# Patient Record
Sex: Female | Born: 1980
Health system: Southern US, Community
[De-identification: ages and names within clinical notes are randomized; demographics above are authoritative.]

## PROBLEM LIST (undated history)

## (undated) DIAGNOSIS — K5792 Diverticulitis of intestine, part unspecified, without perforation or abscess without bleeding: Secondary | ICD-10-CM

## (undated) DIAGNOSIS — Q248 Other specified congenital malformations of heart: Secondary | ICD-10-CM

## (undated) DIAGNOSIS — K219 Gastro-esophageal reflux disease without esophagitis: Secondary | ICD-10-CM

## (undated) DIAGNOSIS — R112 Nausea with vomiting, unspecified: Secondary | ICD-10-CM

## (undated) DIAGNOSIS — I471 Supraventricular tachycardia, unspecified: Secondary | ICD-10-CM

## (undated) DIAGNOSIS — I959 Hypotension, unspecified: Secondary | ICD-10-CM

## (undated) DIAGNOSIS — M1990 Unspecified osteoarthritis, unspecified site: Secondary | ICD-10-CM

## (undated) DIAGNOSIS — T4145XA Adverse effect of unspecified anesthetic, initial encounter: Secondary | ICD-10-CM

## (undated) DIAGNOSIS — Z9889 Other specified postprocedural states: Secondary | ICD-10-CM

## (undated) DIAGNOSIS — T8859XA Other complications of anesthesia, initial encounter: Secondary | ICD-10-CM

## (undated) DIAGNOSIS — M792 Neuralgia and neuritis, unspecified: Secondary | ICD-10-CM

## (undated) DIAGNOSIS — R519 Headache, unspecified: Secondary | ICD-10-CM

## (undated) DIAGNOSIS — R51 Headache: Secondary | ICD-10-CM

## (undated) DIAGNOSIS — E860 Dehydration: Secondary | ICD-10-CM

## (undated) DIAGNOSIS — F419 Anxiety disorder, unspecified: Secondary | ICD-10-CM

## (undated) HISTORY — PX: JOINT REPLACEMENT: SHX530

## (undated) HISTORY — PX: TONSILLECTOMY: SUR1361

## (undated) HISTORY — PX: ABDOMINAL HYSTERECTOMY: SHX81

## (undated) HISTORY — PX: CHOLECYSTECTOMY: SHX55

## (undated) HISTORY — PX: KNEE ARTHROSCOPY: SUR90

## (undated) HISTORY — DX: Hypotension, unspecified: I95.9

## (undated) HISTORY — DX: Diverticulitis of intestine, part unspecified, without perforation or abscess without bleeding: K57.92

## (undated) HISTORY — DX: Supraventricular tachycardia: I47.1

## (undated) HISTORY — DX: Dehydration: E86.0

## (undated) HISTORY — PX: OTHER SURGICAL HISTORY: SHX169

---
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
## 2003-11-25 HISTORY — PX: CHOLECYSTECTOMY: SHX55

## 2006-11-05 ENCOUNTER — Inpatient Hospital Stay: Payer: Self-pay

## 2007-09-03 ENCOUNTER — Emergency Department: Payer: Self-pay | Admitting: Emergency Medicine

## 2007-09-03 ENCOUNTER — Other Ambulatory Visit: Payer: Self-pay

## 2008-07-10 ENCOUNTER — Emergency Department: Payer: Self-pay | Admitting: Emergency Medicine

## 2009-02-14 ENCOUNTER — Emergency Department: Payer: Self-pay | Admitting: Emergency Medicine

## 2009-02-21 ENCOUNTER — Ambulatory Visit: Payer: Self-pay | Admitting: Internal Medicine

## 2009-02-22 ENCOUNTER — Ambulatory Visit: Payer: Self-pay | Admitting: Internal Medicine

## 2010-02-13 ENCOUNTER — Ambulatory Visit: Payer: Self-pay

## 2010-07-04 ENCOUNTER — Ambulatory Visit: Payer: Self-pay | Admitting: Orthopedic Surgery

## 2010-07-04 HISTORY — PX: KNEE ARTHROSCOPY: SUR90

## 2010-07-19 ENCOUNTER — Inpatient Hospital Stay: Payer: Self-pay | Admitting: Internal Medicine

## 2011-01-01 ENCOUNTER — Ambulatory Visit: Payer: Self-pay | Admitting: Internal Medicine

## 2011-04-30 ENCOUNTER — Ambulatory Visit: Payer: Self-pay

## 2011-10-05 ENCOUNTER — Emergency Department: Payer: Self-pay | Admitting: Emergency Medicine

## 2012-05-20 ENCOUNTER — Ambulatory Visit: Payer: Self-pay | Admitting: Family Medicine

## 2012-05-20 LAB — PREGNANCY, URINE: Pregnancy Test, Urine: NEGATIVE m[IU]/mL

## 2012-07-10 ENCOUNTER — Emergency Department: Payer: Self-pay | Admitting: *Deleted

## 2012-07-10 LAB — URINALYSIS, COMPLETE
Bacteria: NONE SEEN
Bilirubin,UR: NEGATIVE
Glucose,UR: NEGATIVE mg/dL (ref 0–75)
Ketone: NEGATIVE
Leukocyte Esterase: NEGATIVE
Protein: NEGATIVE
Specific Gravity: 1.025 (ref 1.003–1.030)
Squamous Epithelial: 1
WBC UR: 1 /HPF (ref 0–5)

## 2012-07-10 LAB — COMPREHENSIVE METABOLIC PANEL
Albumin: 4 g/dL (ref 3.4–5.0)
Anion Gap: 7 (ref 7–16)
Bilirubin,Total: 0.3 mg/dL (ref 0.2–1.0)
Calcium, Total: 8.8 mg/dL (ref 8.5–10.1)
Chloride: 103 mmol/L (ref 98–107)
Creatinine: 0.75 mg/dL (ref 0.60–1.30)
EGFR (African American): >60
EGFR (Non-African Amer.): >60
Glucose: 108 mg/dL — ABNORMAL HIGH (ref 65–99)
Osmolality: 279 (ref 275–301)
Potassium: 3.7 mmol/L (ref 3.5–5.1)
Sodium: 140 mmol/L (ref 136–145)
Total Protein: 7.6 g/dL (ref 6.4–8.2)

## 2012-07-10 LAB — CBC
MCH: 30.1 pg (ref 26.0–34.0)
Platelet: 301 10*3/uL (ref 150–440)
RDW: 12.3 % (ref 11.5–14.5)
WBC: 11.6 10*3/uL — ABNORMAL HIGH (ref 3.6–11.0)

## 2012-07-10 LAB — PREGNANCY, URINE: Pregnancy Test, Urine: NEGATIVE m[IU]/mL

## 2013-06-01 DIAGNOSIS — Q248 Other specified congenital malformations of heart: Secondary | ICD-10-CM | POA: Insufficient documentation

## 2013-11-10 DIAGNOSIS — E860 Dehydration: Secondary | ICD-10-CM

## 2013-11-10 DIAGNOSIS — F32A Depression, unspecified: Secondary | ICD-10-CM | POA: Insufficient documentation

## 2013-11-10 DIAGNOSIS — F329 Major depressive disorder, single episode, unspecified: Secondary | ICD-10-CM | POA: Insufficient documentation

## 2013-11-10 DIAGNOSIS — R197 Diarrhea, unspecified: Secondary | ICD-10-CM | POA: Insufficient documentation

## 2013-11-10 DIAGNOSIS — I959 Hypotension, unspecified: Secondary | ICD-10-CM

## 2013-11-10 HISTORY — DX: Dehydration: E86.0

## 2013-11-10 HISTORY — DX: Hypotension, unspecified: I95.9

## 2014-05-23 ENCOUNTER — Ambulatory Visit: Payer: Self-pay | Admitting: Gynecologic Oncology

## 2014-05-24 ENCOUNTER — Ambulatory Visit: Payer: Self-pay | Admitting: Gynecologic Oncology

## 2014-05-24 LAB — CA 125: CA 125: 8.9 U/mL (ref 0.0–34.0)

## 2014-06-28 ENCOUNTER — Ambulatory Visit: Payer: Self-pay | Admitting: Obstetrics and Gynecology

## 2014-06-28 LAB — PROTIME-INR
INR: 1
PROTHROMBIN TIME: 12.9 s (ref 11.5–14.7)

## 2014-06-28 LAB — CBC
HCT: 41.3 % (ref 35.0–47.0)
HGB: 13.9 g/dL (ref 12.0–16.0)
MCH: 30.2 pg (ref 26.0–34.0)
MCHC: 33.6 g/dL (ref 32.0–36.0)
MCV: 90 fL (ref 80–100)
Platelet: 288 10*3/uL (ref 150–440)
RBC: 4.6 10*6/uL (ref 3.80–5.20)
RDW: 12.6 % (ref 11.5–14.5)
WBC: 9.4 10*3/uL (ref 3.6–11.0)

## 2014-06-28 LAB — COMPREHENSIVE METABOLIC PANEL
ALT: 32 U/L
AST: 28 U/L (ref 15–37)
Albumin: 3.9 g/dL (ref 3.4–5.0)
Alkaline Phosphatase: 49 U/L
Anion Gap: 6 — ABNORMAL LOW (ref 7–16)
BILIRUBIN TOTAL: 0.4 mg/dL (ref 0.2–1.0)
BUN: 9 mg/dL (ref 7–18)
CHLORIDE: 105 mmol/L (ref 98–107)
Calcium, Total: 8.7 mg/dL (ref 8.5–10.1)
Co2: 27 mmol/L (ref 21–32)
Creatinine: 0.7 mg/dL (ref 0.60–1.30)
EGFR (Non-African Amer.): >60
Glucose: 86 mg/dL (ref 65–99)
OSMOLALITY: 274 (ref 275–301)
POTASSIUM: 4 mmol/L (ref 3.5–5.1)
SODIUM: 138 mmol/L (ref 136–145)
Total Protein: 7.6 g/dL (ref 6.4–8.2)

## 2014-06-28 LAB — APTT: Activated PTT: 31 secs (ref 23.6–35.9)

## 2014-07-04 ENCOUNTER — Ambulatory Visit: Payer: Self-pay | Admitting: Obstetrics and Gynecology

## 2014-07-04 HISTORY — PX: ABDOMINAL HYSTERECTOMY: SHX81

## 2014-07-04 HISTORY — PX: LAPAROSCOPIC TOTAL HYSTERECTOMY: SUR800

## 2014-07-05 LAB — BASIC METABOLIC PANEL
Anion Gap: 6 — ABNORMAL LOW (ref 7–16)
BUN: 6 mg/dL — ABNORMAL LOW (ref 7–18)
CHLORIDE: 103 mmol/L (ref 98–107)
Calcium, Total: 8.6 mg/dL (ref 8.5–10.1)
Co2: 28 mmol/L (ref 21–32)
Creatinine: 0.93 mg/dL (ref 0.60–1.30)
EGFR (Non-African Amer.): >60
GLUCOSE: 168 mg/dL — AB (ref 65–99)
Osmolality: 275 (ref 275–301)
Potassium: 4 mmol/L (ref 3.5–5.1)
Sodium: 137 mmol/L (ref 136–145)

## 2014-07-05 LAB — CBC
HCT: 37.5 % (ref 35.0–47.0)
HCT: 37.8 % (ref 35.0–47.0)
HGB: 12.7 g/dL (ref 12.0–16.0)
HGB: 12.4 g/dL (ref 12.0–16.0)
MCH: 30.2 pg (ref 26.0–34.0)
MCH: 29.6 pg (ref 26.0–34.0)
MCHC: 33.8 g/dL (ref 32.0–36.0)
MCHC: 32.9 g/dL (ref 32.0–36.0)
MCV: 90 fL (ref 80–100)
MCV: 90 fL (ref 80–100)
PLATELETS: 266 10*3/uL (ref 150–440)
Platelet: 261 10*3/uL (ref 150–440)
RBC: 4.19 10*6/uL (ref 3.80–5.20)
RBC: 4.2 10*6/uL (ref 3.80–5.20)
RDW: 12.4 % (ref 11.5–14.5)
RDW: 12.7 % (ref 11.5–14.5)
WBC: 19.9 10*3/uL — ABNORMAL HIGH (ref 3.6–11.0)
WBC: 17.1 10*3/uL — ABNORMAL HIGH (ref 3.6–11.0)

## 2014-07-05 LAB — MAGNESIUM: MAGNESIUM: 1.7 mg/dL — AB

## 2014-07-06 LAB — CBC
HCT: 34.7 % — ABNORMAL LOW
HGB: 11.6 g/dL — ABNORMAL LOW
MCH: 30.2 pg
MCHC: 33.5 g/dL
MCV: 90 fL
Platelet: 233 x10 3/mm 3
RBC: 3.84 X10 6/mm 3
RDW: 12.8 %
WBC: 12.2 x10 3/mm 3 — ABNORMAL HIGH

## 2014-07-06 LAB — PATHOLOGY REPORT

## 2015-01-06 ENCOUNTER — Emergency Department: Payer: Self-pay | Admitting: Emergency Medicine

## 2015-03-13 ENCOUNTER — Emergency Department
Admit: 2015-03-13 | Disposition: A | Discharge status: Home / Self Care | Payer: Self-pay | Admitting: Emergency Medicine

## 2015-03-17 NOTE — Op Note (Signed)
PATIENT NAME:  Brenda Sellers, Brenda Sellers MR#:  [id number] DATE OF BIRTH:  04/22/1981  DATE OF PROCEDURE:  07/04/2014  PREOPERATIVE DIAGNOSES:  1.  Chronic pelvic pain.  2.  Family history of breast and ovarian cancer.   POSTOPERATIVE DIAGNOSES: 1.  Chronic pelvic pain.  2.  Family history of breast and ovarian cancer.   PROCEDURES:  1.  Total laparoscopic hysterectomy.  2.  Bilateral salpingo-oophorectomy.  3.  Cystoscopy.   ANESTHESIA: General.  SURGEON: Conard Novak, MD   ASSISTANT SURGEON: Florina Ou. Bonney Aid, MD   ESTIMATED BLOOD LOSS: 100 mL.  OPERATIVE FLUIDS: Crystalloid 1200 mL.   URINE OUTPUT: Clear urine 200 mL.   COMPLICATIONS: None.   FINDINGS:  1.  Normal-appearing gravid uterus, fallopian tubes, and ovaries (small paratubal cyst on right).  2.  No obvious defects of the lateral wall and bilateral ureteral efflux of urine on cystoscopy.   SPECIMENS:  1.  Uterus and cervix.  2.  Right and left fallopian tubes and ovaries.   CONDITION AT END OF PROCEDURE: Stable.   PROCEDURE IN DETAIL: The patient was taken to the Operating Room where general anesthesia was administered and found to be adequate. The patient was placed in the dorsal supine lithotomy position in [place] stirrups and prepped and draped in the usual sterile fashion. The patient was positioned in such a fashion to minimize any risk of damage to vulnerable nerves. After a timeout was called, sterile speculum was placed in the bladder and a single-tooth tenaculum was affixed to the anterior lip of the cervix. The uterus was sounded to a depth of 8 cm. The V-Care device was then affixed the cervix according to the manufacturer's recommendations after removal of the single-tooth tenaculum. The V-Care balloon was inflated. A vaginal occluder balloon was placed over the V-Care device at this point.   An indwelling catheter was then placed in her bladder.   Attention was turned to the abdomen where after  injection of local anesthetic a 5 mm infraumbilical incision was made with the scalpel. Entrance was gained into the abdomen using an Optiview trocar technique with direct camera visualization upon entrance. Entrance to the abdomen was verified using opening pressures. The abdomen was then insufflated with CO2. The camera was reintroduced through the trocar and atraumatic entry was verified. A left lower quadrant 5 mm port was placed under direct intra-abdominal camera visualization without difficulty. A right lower quadrant 11 mm trocar was placed similarly under direct intra-abdominal camera visualization. A survey of the pelvis was taken with the above-noted findings. The ureters were then visualized and found to be well away from operative area of interest. The right infundibulopelvic ligament was identified and was ligated using bipolar electrocautery and transected using the Harmonic scalpel. This was found to be hemostatic and the broad ligament was then dissected to the level of the round ligament, which was similarly cauterized and transected. The anterior leaf of the broad ligament was then transected using the Harmonic scalpel to the level of the internal os. A bladder flap was created. The posterior leaf of the broad ligament was then dissected using the Harmonic scalpel, and the right uterine artery was skeletonized, and ligated using bipolar electrocautery, and transected with the Harmonic scalpel. The same procedure was carried out on the left side after visualization of the ureter. Once the bladder was well away from the [facility] ring, the colpotomy was made using the Harmonic in a circumferential fashion. The Koh ring was followed until the  uterus and cervix were completely liberated. The uterus was then removed through the vagina. The vaginal cleft was closed using #0 V-Loc suture in a running fashion. A gloved hand was placed in the vagina to test the integrity of the closure.   All pedicles were  inspected and found to be hemostatic. The abdomen was then desufflated of CO2.   Cystoscopy was undertaken using a 70 degree cystoscope after removal of the indwelling catheter. The above-noted findings were appreciated, and the cystoscope was then removed and the indwelling catheter was replaced. The vagina was inspected and found to be clear of all instrumentation, sponges, and tissue.   All of the trocars were removed and each port site was closed subcutaneously using #3-0 Vicryl. The skin was reapproximated in the right lower quadrant port using a #4-0 Vicryl undyed. All skin closures were reinforced using Dermabond and another total of 10 mL of local anesthetic was injected for a total of 20 mL throughout the procedure.   The patient tolerated the procedure well. Sponge, lap, and needle counts were correct x 2. For VTE prophylaxis, the patient was wearing pneumatic compression stockings throughout the entire procedure. For antibiotic prophylaxis, the patient received clindamycin 900 mg and gentamicin 120 mg IV prior to the start of the surgery. The patient was awakened in the Operating Room and taken to the recovery area in stable condition.    ____________________________ Conard NovakStephen D. Jackson, MD sdj:TT D: 07/04/2014 13:45:24 ET T: 07/04/2014 16:35:03 ET JOB#: [id number]  cc: Conard NovakStephen D. Jackson, MD, <Dictator> Conard NovakSTEPHEN D JACKSON MD ELECTRONICALLY SIGNED 08/09/2014 18:16

## 2015-03-24 ENCOUNTER — Emergency Department
Admit: 2015-03-24 | Disposition: A | Discharge status: Home / Self Care | Payer: Self-pay | Admitting: Emergency Medicine

## 2015-03-28 ENCOUNTER — Other Ambulatory Visit: Payer: Self-pay | Admitting: Orthopedic Surgery

## 2015-03-28 DIAGNOSIS — S52122A Displaced fracture of head of left radius, initial encounter for closed fracture: Secondary | ICD-10-CM

## 2015-03-29 ENCOUNTER — Ambulatory Visit
Admission: RE | Admit: 2015-03-29 | Discharge: 2015-03-29 | Disposition: A | Discharge status: Home / Self Care | Payer: 59 | Source: Ambulatory Visit | Attending: Orthopedic Surgery | Admitting: Orthopedic Surgery

## 2015-03-29 DIAGNOSIS — S52122A Displaced fracture of head of left radius, initial encounter for closed fracture: Secondary | ICD-10-CM

## 2015-04-03 ENCOUNTER — Encounter (HOSPITAL_COMMUNITY): Payer: Self-pay | Admitting: *Deleted

## 2015-04-03 MED ORDER — CEFAZOLIN SODIUM-DEXTROSE 2-3 GM-% IV SOLR
2.0000 g | INTRAVENOUS | Status: AC
  Administered 2015-04-04: 2 g via INTRAVENOUS
  Filled 2015-04-03: qty 50

## 2015-04-04 ENCOUNTER — Ambulatory Visit (HOSPITAL_COMMUNITY)
Admission: RE | Admit: 2015-04-04 | Discharge: 2015-04-05 | Disposition: A | Discharge status: Home / Self Care | Payer: 59 | Source: Ambulatory Visit | Attending: Orthopedic Surgery | Admitting: Orthopedic Surgery

## 2015-04-04 ENCOUNTER — Encounter (HOSPITAL_COMMUNITY): Payer: Self-pay | Admitting: *Deleted

## 2015-04-04 ENCOUNTER — Inpatient Hospital Stay (HOSPITAL_COMMUNITY): Payer: 59 | Admitting: Anesthesiology

## 2015-04-04 ENCOUNTER — Encounter (HOSPITAL_COMMUNITY)
Admission: RE | Disposition: A | Discharge status: Home / Self Care | Payer: Self-pay | Source: Ambulatory Visit | Attending: Orthopedic Surgery

## 2015-04-04 DIAGNOSIS — F419 Anxiety disorder, unspecified: Secondary | ICD-10-CM | POA: Insufficient documentation

## 2015-04-04 DIAGNOSIS — Z90710 Acquired absence of both cervix and uterus: Secondary | ICD-10-CM | POA: Insufficient documentation

## 2015-04-04 DIAGNOSIS — Z881 Allergy status to other antibiotic agents status: Secondary | ICD-10-CM | POA: Diagnosis not present

## 2015-04-04 DIAGNOSIS — Z886 Allergy status to analgesic agent status: Secondary | ICD-10-CM | POA: Insufficient documentation

## 2015-04-04 DIAGNOSIS — S52122A Displaced fracture of head of left radius, initial encounter for closed fracture: Principal | ICD-10-CM | POA: Insufficient documentation

## 2015-04-04 DIAGNOSIS — Y9351 Activity, roller skating (inline) and skateboarding: Secondary | ICD-10-CM | POA: Diagnosis not present

## 2015-04-04 DIAGNOSIS — Z9049 Acquired absence of other specified parts of digestive tract: Secondary | ICD-10-CM | POA: Diagnosis not present

## 2015-04-04 DIAGNOSIS — W1830XA Fall on same level, unspecified, initial encounter: Secondary | ICD-10-CM | POA: Insufficient documentation

## 2015-04-04 HISTORY — DX: Other specified postprocedural states: Z98.890

## 2015-04-04 HISTORY — DX: Anxiety disorder, unspecified: F41.9

## 2015-04-04 HISTORY — DX: Other complications of anesthesia, initial encounter: T88.59XA

## 2015-04-04 HISTORY — DX: Adverse effect of unspecified anesthetic, initial encounter: T41.45XA

## 2015-04-04 HISTORY — PX: ORIF ELBOW FRACTURE: SHX5031

## 2015-04-04 HISTORY — DX: Other specified postprocedural states: R11.2

## 2015-04-04 LAB — CBC
HCT: 41.5 % (ref 36.0–46.0)
Hemoglobin: 13.7 g/dL (ref 12.0–15.0)
MCH: 28.8 pg (ref 26.0–34.0)
MCHC: 33 g/dL (ref 30.0–36.0)
MCV: 87.4 fL (ref 78.0–100.0)
Platelets: 255 10*3/uL (ref 150–400)
RBC: 4.75 MIL/uL (ref 3.87–5.11)
RDW: 12.2 % (ref 11.5–15.5)
WBC: 7.8 10*3/uL (ref 4.0–10.5)

## 2015-04-04 LAB — BASIC METABOLIC PANEL
Anion gap: 13 (ref 5–15)
BUN: 8 mg/dL (ref 6–20)
CALCIUM: 9.3 mg/dL (ref 8.9–10.3)
CO2: 21 mmol/L — ABNORMAL LOW (ref 22–32)
Chloride: 102 mmol/L (ref 101–111)
Creatinine, Ser: 0.81 mg/dL (ref 0.44–1.00)
Glucose, Bld: 91 mg/dL (ref 70–99)
Potassium: 3.8 mmol/L (ref 3.5–5.1)
Sodium: 136 mmol/L (ref 135–145)

## 2015-04-04 SURGERY — OPEN REDUCTION INTERNAL FIXATION (ORIF) ELBOW/OLECRANON FRACTURE
Anesthesia: General | Site: Elbow | Laterality: Left

## 2015-04-04 MED ORDER — HYDROMORPHONE HCL 1 MG/ML IJ SOLN
INTRAMUSCULAR | Status: AC
  Filled 2015-04-04: qty 1

## 2015-04-04 MED ORDER — DIAZEPAM 5 MG PO TABS
5.0000 mg | ORAL_TABLET | Freq: Every day | ORAL | Status: DC
  Administered 2015-04-04: 5 mg via ORAL
  Filled 2015-04-04: qty 1

## 2015-04-04 MED ORDER — MIDAZOLAM HCL 5 MG/5ML IJ SOLN
INTRAMUSCULAR | Status: DC | PRN
  Administered 2015-04-04 (×2): 1 mg via INTRAVENOUS

## 2015-04-04 MED ORDER — DEXAMETHASONE SODIUM PHOSPHATE 10 MG/ML IJ SOLN
INTRAMUSCULAR | Status: DC | PRN
  Administered 2015-04-04: 10 mg via INTRAVENOUS

## 2015-04-04 MED ORDER — PROPOFOL 10 MG/ML IV BOLUS
INTRAVENOUS | Status: AC
  Filled 2015-04-04: qty 20

## 2015-04-04 MED ORDER — OXYCODONE-ACETAMINOPHEN 5-325 MG PO TABS
1.0000 | ORAL_TABLET | ORAL | Status: DC | PRN
  Administered 2015-04-04 – 2015-04-05 (×4): 2 via ORAL
  Filled 2015-04-04 (×4): qty 2

## 2015-04-04 MED ORDER — MORPHINE SULFATE 2 MG/ML IJ SOLN
1.0000 mg | INTRAMUSCULAR | Status: DC | PRN
  Administered 2015-04-05 (×3): 1 mg via INTRAVENOUS
  Filled 2015-04-04 (×3): qty 1

## 2015-04-04 MED ORDER — OXYCODONE HCL 5 MG PO TABS
5.0000 mg | ORAL_TABLET | Freq: Four times a day (QID) | ORAL | Status: DC | PRN
  Administered 2015-04-05: 5 mg via ORAL
  Filled 2015-04-04: qty 1

## 2015-04-04 MED ORDER — OXYCODONE-ACETAMINOPHEN 5-325 MG PO TABS: 1.0000 | ORAL_TABLET | Freq: Four times a day (QID) | ORAL | Status: DC | PRN

## 2015-04-04 MED ORDER — ROPIVACAINE HCL 5 MG/ML IJ SOLN
INTRAMUSCULAR | Status: DC | PRN
  Administered 2015-04-04: 20 mL via PERINEURAL

## 2015-04-04 MED ORDER — ONDANSETRON HCL 4 MG/2ML IJ SOLN
4.0000 mg | Freq: Four times a day (QID) | INTRAMUSCULAR | Status: DC | PRN
  Administered 2015-04-05 (×2): 4 mg via INTRAVENOUS
  Filled 2015-04-04 (×3): qty 2

## 2015-04-04 MED ORDER — ONDANSETRON HCL 4 MG/2ML IJ SOLN
INTRAMUSCULAR | Status: DC | PRN
  Administered 2015-04-04: 4 mg via INTRAVENOUS

## 2015-04-04 MED ORDER — VITAMIN C 500 MG PO TABS
1000.0000 mg | ORAL_TABLET | Freq: Every day | ORAL | Status: DC
  Administered 2015-04-04: 1000 mg via ORAL
  Filled 2015-04-04: qty 2

## 2015-04-04 MED ORDER — KCL IN DEXTROSE-NACL 20-5-0.45 MEQ/L-%-% IV SOLN
INTRAVENOUS | Status: AC
  Filled 2015-04-04: qty 1000

## 2015-04-04 MED ORDER — ONDANSETRON HCL 4 MG PO TABS
4.0000 mg | ORAL_TABLET | Freq: Four times a day (QID) | ORAL | Status: DC | PRN
Start: 2015-04-04 — End: 2015-04-05

## 2015-04-04 MED ORDER — MIDAZOLAM HCL 2 MG/2ML IJ SOLN
INTRAMUSCULAR | Status: AC
  Filled 2015-04-04: qty 2

## 2015-04-04 MED ORDER — CLINDAMYCIN PHOSPHATE 600 MG/50ML IV SOLN
600.0000 mg | Freq: Three times a day (TID) | INTRAVENOUS | Status: DC
Start: 2015-04-04 — End: 2015-04-05
  Administered 2015-04-04 – 2015-04-05 (×3): 600 mg via INTRAVENOUS
  Filled 2015-04-04 (×5): qty 50

## 2015-04-04 MED ORDER — HYDROMORPHONE HCL 1 MG/ML IJ SOLN
0.2500 mg | INTRAMUSCULAR | Status: DC | PRN
  Administered 2015-04-04 (×4): 0.5 mg via INTRAVENOUS

## 2015-04-04 MED ORDER — SERTRALINE HCL 100 MG PO TABS
100.0000 mg | ORAL_TABLET | Freq: Every day | ORAL | Status: DC
  Administered 2015-04-05: 100 mg via ORAL
  Filled 2015-04-04: qty 1

## 2015-04-04 MED ORDER — ALPRAZOLAM 0.5 MG PO TABS: 0.5000 mg | ORAL_TABLET | Freq: Four times a day (QID) | ORAL | Status: DC | PRN

## 2015-04-04 MED ORDER — LIDOCAINE HCL (CARDIAC) 20 MG/ML IV SOLN
INTRAVENOUS | Status: DC | PRN
  Administered 2015-04-04: 60 mg via INTRAVENOUS

## 2015-04-04 MED ORDER — FENTANYL CITRATE (PF) 100 MCG/2ML IJ SOLN
INTRAMUSCULAR | Status: DC | PRN
  Administered 2015-04-04 (×5): 50 ug via INTRAVENOUS

## 2015-04-04 MED ORDER — PROMETHAZINE HCL 25 MG/ML IJ SOLN
INTRAMUSCULAR | Status: AC
Start: 2015-04-04 — End: 2015-04-05
  Filled 2015-04-04: qty 1

## 2015-04-04 MED ORDER — METHOCARBAMOL 500 MG PO TABS
500.0000 mg | ORAL_TABLET | Freq: Four times a day (QID) | ORAL | Status: DC
Start: 2015-04-04 — End: 2016-01-02

## 2015-04-04 MED ORDER — DOCUSATE SODIUM 100 MG PO CAPS
100.0000 mg | ORAL_CAPSULE | Freq: Two times a day (BID) | ORAL | Status: DC
  Administered 2015-04-05: 100 mg via ORAL
  Filled 2015-04-04 (×2): qty 1

## 2015-04-04 MED ORDER — KCL IN DEXTROSE-NACL 20-5-0.45 MEQ/L-%-% IV SOLN
INTRAVENOUS | Status: DC
  Administered 2015-04-04 – 2015-04-05 (×2): via INTRAVENOUS
  Filled 2015-04-04 (×3): qty 1000

## 2015-04-04 MED ORDER — FENTANYL CITRATE (PF) 250 MCG/5ML IJ SOLN
INTRAMUSCULAR | Status: AC
  Filled 2015-04-04: qty 5

## 2015-04-04 MED ORDER — METHOCARBAMOL 500 MG PO TABS
500.0000 mg | ORAL_TABLET | Freq: Four times a day (QID) | ORAL | Status: DC | PRN
  Administered 2015-04-04 – 2015-04-05 (×4): 500 mg via ORAL
  Filled 2015-04-04 (×4): qty 1

## 2015-04-04 MED ORDER — ONDANSETRON HCL 4 MG PO TABS: 4.0000 mg | ORAL_TABLET | Freq: Three times a day (TID) | ORAL | Status: DC | PRN

## 2015-04-04 MED ORDER — DEXTROSE 5 % IV SOLN
500.0000 mg | Freq: Four times a day (QID) | INTRAVENOUS | Status: DC | PRN
  Filled 2015-04-04: qty 5

## 2015-04-04 MED ORDER — ONDANSETRON HCL 4 MG/2ML IJ SOLN
INTRAMUSCULAR | Status: AC
  Filled 2015-04-04: qty 2

## 2015-04-04 MED ORDER — DOCUSATE SODIUM 100 MG PO CAPS: 100.0000 mg | ORAL_CAPSULE | Freq: Two times a day (BID) | ORAL | Status: DC

## 2015-04-04 MED ORDER — SCOPOLAMINE 1 MG/3DAYS TD PT72: 1.0000 | MEDICATED_PATCH | TRANSDERMAL | Status: DC

## 2015-04-04 MED ORDER — HYDROCODONE-ACETAMINOPHEN 10-325 MG PO TABS: 1.0000 | ORAL_TABLET | ORAL | Status: DC | PRN

## 2015-04-04 MED ORDER — PROMETHAZINE HCL 25 MG/ML IJ SOLN
6.2500 mg | INTRAMUSCULAR | Status: DC | PRN
  Administered 2015-04-04: 6.25 mg via INTRAVENOUS
  Administered 2015-04-04: 12.5 mg via INTRAVENOUS

## 2015-04-04 MED ORDER — 0.9 % SODIUM CHLORIDE (POUR BTL) OPTIME
TOPICAL | Status: DC | PRN
  Administered 2015-04-04: 1000 mL

## 2015-04-04 MED ORDER — LIDOCAINE HCL (CARDIAC) 20 MG/ML IV SOLN
INTRAVENOUS | Status: AC
  Filled 2015-04-04: qty 5

## 2015-04-04 MED ORDER — PROPOFOL 10 MG/ML IV BOLUS
INTRAVENOUS | Status: DC | PRN
  Administered 2015-04-04: 50 mg via INTRAVENOUS
  Administered 2015-04-04: 200 mg via INTRAVENOUS

## 2015-04-04 MED ORDER — VITAMIN C 500 MG PO TABS: 500.0000 mg | ORAL_TABLET | Freq: Every day | ORAL | Status: DC

## 2015-04-04 MED ORDER — OXYCODONE-ACETAMINOPHEN 10-325 MG PO TABS: 1.0000 | ORAL_TABLET | ORAL | Status: DC | PRN

## 2015-04-04 MED ORDER — CHLORHEXIDINE GLUCONATE 4 % EX LIQD
60.0000 mL | Freq: Once | CUTANEOUS | Status: DC
  Filled 2015-04-04: qty 60

## 2015-04-04 MED ORDER — DIPHENHYDRAMINE HCL 25 MG PO CAPS
25.0000 mg | ORAL_CAPSULE | Freq: Four times a day (QID) | ORAL | Status: DC | PRN
Start: 2015-04-04 — End: 2015-04-05

## 2015-04-04 MED ORDER — FENTANYL CITRATE (PF) 100 MCG/2ML IJ SOLN
INTRAMUSCULAR | Status: AC
  Filled 2015-04-04: qty 2

## 2015-04-04 MED ORDER — DEXAMETHASONE SODIUM PHOSPHATE 10 MG/ML IJ SOLN
INTRAMUSCULAR | Status: AC
  Filled 2015-04-04: qty 1

## 2015-04-04 MED ORDER — ADULT MULTIVITAMIN W/MINERALS CH: 1.0000 | ORAL_TABLET | Freq: Every day | ORAL | Status: DC

## 2015-04-04 MED ORDER — LIDOCAINE-EPINEPHRINE (PF) 1.5 %-1:200000 IJ SOLN
INTRAMUSCULAR | Status: DC | PRN
  Administered 2015-04-04: 10 mL via PERINEURAL

## 2015-04-04 MED ORDER — LACTATED RINGERS IV SOLN
INTRAVENOUS | Status: DC
  Administered 2015-04-04 (×2): via INTRAVENOUS

## 2015-04-04 MED ORDER — ALPRAZOLAM 0.25 MG PO TABS: 0.2500 mg | ORAL_TABLET | ORAL | Status: DC | PRN

## 2015-04-04 MED ORDER — ZOLPIDEM TARTRATE 5 MG PO TABS: 5.0000 mg | ORAL_TABLET | Freq: Every evening | ORAL | Status: DC | PRN

## 2015-04-04 MED ORDER — OXYCODONE-ACETAMINOPHEN 10-325 MG PO TABS: 1.0000 | ORAL_TABLET | Freq: Four times a day (QID) | ORAL | Status: DC | PRN

## 2015-04-04 MED ORDER — SCOPOLAMINE 1 MG/3DAYS TD PT72
MEDICATED_PATCH | TRANSDERMAL | Status: AC
  Administered 2015-04-04: 1.5 mg
  Filled 2015-04-04: qty 1

## 2015-04-04 SURGICAL SUPPLY — 61 items
BANDAGE ELASTIC 3 VELCRO ST LF (GAUZE/BANDAGES/DRESSINGS) ×2 IMPLANT
BANDAGE ELASTIC 4 VELCRO ST LF (GAUZE/BANDAGES/DRESSINGS) ×2 IMPLANT
BENZOIN TINCTURE PRP APPL 2/3 (GAUZE/BANDAGES/DRESSINGS) ×2 IMPLANT
BIT DRILL MINI LNG ACUTRAK 2 (BIT) ×1 IMPLANT
BNDG COHESIVE 4X5 TAN STRL (GAUZE/BANDAGES/DRESSINGS) ×2 IMPLANT
BNDG ESMARK 4X9 LF (GAUZE/BANDAGES/DRESSINGS) ×2 IMPLANT
BNDG GAUZE ELAST 4 BULKY (GAUZE/BANDAGES/DRESSINGS) ×2 IMPLANT
CORDS BIPOLAR (ELECTRODE) ×2 IMPLANT
COVER MAYO STAND STRL (DRAPES) IMPLANT
COVER SURGICAL LIGHT HANDLE (MISCELLANEOUS) ×2 IMPLANT
CUFF TOURNIQUET SINGLE 18IN (TOURNIQUET CUFF) IMPLANT
CUFF TOURNIQUET SINGLE 24IN (TOURNIQUET CUFF) IMPLANT
DRAPE INCISE IOBAN 66X45 STRL (DRAPES) ×2 IMPLANT
DRAPE OEC MINIVIEW 54X84 (DRAPES) ×2 IMPLANT
DRAPE ORTHO SPLIT 77X108 STRL (DRAPES) ×1
DRAPE SURG ORHT 6 SPLT 77X108 (DRAPES) ×1 IMPLANT
DRAPE U-SHAPE 47X51 STRL (DRAPES) ×2 IMPLANT
DRILL MINI LNG ACUTRAK 2 (BIT) ×2
DRSG ADAPTIC 3X8 NADH LF (GAUZE/BANDAGES/DRESSINGS) ×2 IMPLANT
GAUZE SPONGE 4X4 12PLY STRL (GAUZE/BANDAGES/DRESSINGS) ×2 IMPLANT
GLOVE BIOGEL PI IND STRL 8.5 (GLOVE) ×2 IMPLANT
GLOVE BIOGEL PI INDICATOR 8.5 (GLOVE) ×2
GLOVE SURG ORTHO 8.0 STRL STRW (GLOVE) ×4 IMPLANT
GOWN STRL REUS W/ TWL LRG LVL3 (GOWN DISPOSABLE) ×2 IMPLANT
GOWN STRL REUS W/ TWL XL LVL3 (GOWN DISPOSABLE) ×1 IMPLANT
GOWN STRL REUS W/TWL LRG LVL3 (GOWN DISPOSABLE) ×2
GOWN STRL REUS W/TWL XL LVL3 (GOWN DISPOSABLE) ×1
GUIDEWIRE ORTHO MINI ACTK .045 (WIRE) ×6 IMPLANT
KIT BASIN OR (CUSTOM PROCEDURE TRAY) ×4 IMPLANT
KIT ROOM TURNOVER OR (KITS) ×2 IMPLANT
LONG MINI SCREW ×2 IMPLANT
LOOP VESSEL MAXI BLUE (MISCELLANEOUS) IMPLANT
MANIFOLD NEPTUNE II (INSTRUMENTS) IMPLANT
NEEDLE HYPO 25GX1X1/2 BEV (NEEDLE) IMPLANT
NS IRRIG 1000ML POUR BTL (IV SOLUTION) ×2 IMPLANT
PACK ORTHO EXTREMITY (CUSTOM PROCEDURE TRAY) ×2 IMPLANT
PAD ARMBOARD 7.5X6 YLW CONV (MISCELLANEOUS) ×2 IMPLANT
PAD CAST 3X4 CTTN HI CHSV (CAST SUPPLIES) ×3 IMPLANT
PAD CAST 4YDX4 CTTN HI CHSV (CAST SUPPLIES) IMPLANT
PADDING CAST COTTON 3X4 STRL (CAST SUPPLIES) ×3
PADDING CAST COTTON 4X4 STRL (CAST SUPPLIES)
SCREW ACUTRAK 2 MINI 18MM (Screw) ×2 IMPLANT
SCREW ACUTRAK 2 MINI 20MM (Screw) ×2 IMPLANT
SLING ARM FOAM STRAP LRG (SOFTGOODS) ×2 IMPLANT
SOAP 2 % CHG 4 OZ (WOUND CARE) ×2 IMPLANT
SPLINT FIBERGLASS 3X35 (CAST SUPPLIES) ×2 IMPLANT
STRIP CLOSURE SKIN 1/2X4 (GAUZE/BANDAGES/DRESSINGS) ×2 IMPLANT
SUCTION FRAZIER TIP 10 FR DISP (SUCTIONS) IMPLANT
SUT MERSILENE 4 0 P 3 (SUTURE) IMPLANT
SUT PROLENE 4 0 PS 2 18 (SUTURE) ×2 IMPLANT
SUT VIC AB 0 CT1 27 (SUTURE) ×1
SUT VIC AB 0 CT1 27XBRD ANBCTR (SUTURE) ×1 IMPLANT
SUT VIC AB 2-0 CT1 27 (SUTURE) ×1
SUT VIC AB 2-0 CT1 TAPERPNT 27 (SUTURE) ×1 IMPLANT
SUT VICRYL 4-0 PS2 18IN ABS (SUTURE) ×2 IMPLANT
SYR CONTROL 10ML LL (SYRINGE) ×2 IMPLANT
TOWEL OR 17X24 6PK STRL BLUE (TOWEL DISPOSABLE) ×2 IMPLANT
TOWEL OR 17X26 10 PK STRL BLUE (TOWEL DISPOSABLE) ×2 IMPLANT
TUBE CONNECTING 12X1/4 (SUCTIONS) ×2 IMPLANT
UNDERPAD 30X30 INCONTINENT (UNDERPADS AND DIAPERS) IMPLANT
WATER STERILE IRR 1000ML POUR (IV SOLUTION) IMPLANT

## 2015-04-04 NOTE — Anesthesia Postprocedure Evaluation (Signed)
  Anesthesia Post-op Note  Patient: Brenda Sellers  Procedure(s) Performed: Procedure(s): OPEN REDUCTION INTERNAL FIXATION (ORIF) LEFT ELBOW/OLECRANON FRACTURE (Left)  Patient Location: PACU  Anesthesia Type: General   Level of Consciousness: awake, alert  and oriented  Airway and Oxygen Therapy: Patient Spontanous Breathing  Post-op Pain: moderate  Post-op Assessment: Post-op Vital signs reviewed  Post-op Vital Signs: Reviewed  Last Vitals:  Filed Vitals:   04/04/15 1715  BP: 122/109  Pulse:   Temp:   Resp:     Complications: No apparent anesthesia complications

## 2015-04-04 NOTE — Anesthesia Preprocedure Evaluation (Signed)
Anesthesia Evaluation  Patient identified by MRN, date of birth, ID band Patient awake    Reviewed: Allergy & Precautions, NPO status , Patient's Chart, lab work & pertinent test results  History of Anesthesia Complications (+) PONV  Airway Mallampati: II  TM Distance: >3 FB Neck ROM: Full    Dental no notable dental hx.    Pulmonary neg pulmonary ROS, former smoker,  breath sounds clear to auscultation  Pulmonary exam normal       Cardiovascular negative cardio ROS Normal cardiovascular examRhythm:Regular Rate:Normal     Neuro/Psych negative neurological ROS  negative psych ROS   GI/Hepatic negative GI ROS, Neg liver ROS,   Endo/Other  obesity  Renal/GU negative Renal ROS  negative genitourinary   Musculoskeletal negative musculoskeletal ROS (+)   Abdominal   Peds negative pediatric ROS (+)  Hematology negative hematology ROS (+)   Anesthesia Other Findings   Reproductive/Obstetrics negative OB ROS                             Anesthesia Physical Anesthesia Plan  ASA: II  Anesthesia Plan: General   Post-op Pain Management:    Induction: Intravenous  Airway Management Planned: Oral ETT and LMA  Additional Equipment:   Intra-op Plan:   Post-operative Plan: Extubation in OR  Informed Consent: I have reviewed the patients History and Physical, chart, labs and discussed the procedure including the risks, benefits and alternatives for the proposed anesthesia with the patient or authorized representative who has indicated his/her understanding and acceptance.   Dental advisory given  Plan Discussed with: CRNA and Surgeon  Anesthesia Plan Comments:         Anesthesia Quick Evaluation

## 2015-04-04 NOTE — Transfer of Care (Signed)
Immediate Anesthesia Transfer of Care Note  Patient: Brenda Sellers  Procedure(s) Performed: Procedure(s): OPEN REDUCTION INTERNAL FIXATION (ORIF) LEFT ELBOW/OLECRANON FRACTURE (Left)  Patient Location: PACU  Anesthesia Type:GA combined with regional for post-op pain  Level of Consciousness: sedated  Airway & Oxygen Therapy: Patient Spontanous Breathing and Patient connected to nasal cannula oxygen  Post-op Assessment: Report given to RN and Post -op Vital signs reviewed and stable  Post vital signs: stable  Last Vitals:  Filed Vitals:   04/04/15 1458  BP: 110/80  Pulse: 91  Temp: 36.9 C  Resp: 20    Complications: No apparent anesthesia complications

## 2015-04-04 NOTE — H&P (Signed)
Brenda Sellers is an 34 y.o. female.   Chief Complaint: LEFT ELBOW RADIAL HEAD FRACTURE HPI: PT WITH FALL ON LEFT WRIST WITH INJURY TO LEFT ELBOW NO PRIOR INJURY TO LEFT ELBOW PT SEEN/EVALUATED IN OFFICE PT HERE FOR SURGERY TO LEFT ELBOW  Past Medical History  Diagnosis Date  . Complication of anesthesia   . PONV (postoperative nausea and vomiting)     Last surgery 06/2014- nausea not vomitting was pre medicated.  . Anxiety     Past Surgical History  Procedure Laterality Date  . Laparoscopic total hysterectomy  07/04/14  . Tonsillectomy    . Knee arthroscopy Right     Menicus  . Abdominal hysterectomy    . Cholecystectomy    . Anterior cruciate ligament repair Left 1998    Menicus, PCL Screw    History reviewed. No pertinent family history. Social History:  reports that she has quit smoking. She does not have any smokeless tobacco history on file. She reports that she does not drink alcohol or use illicit drugs.  Allergies:  Allergies  Allergen Reactions  . Augmentin [Amoxicillin-Pot Clavulanate] Rash    Can take any other PCNs  . Vicodin [Hydrocodone-Acetaminophen] Nausea And Vomiting and Rash    Medications Prior to Admission  Medication Sig Dispense Refill  . acetaminophen (TYLENOL) 500 MG tablet Take 1,000 mg by mouth every 6 (six) hours as needed. None since started on Percocet    . diazepam (VALIUM) 5 MG tablet Take 5 mg by mouth at bedtime.    . fluconazole (DIFLUCAN) 100 MG tablet Take 100 mg by mouth daily.    Marland Kitchen. oxyCODONE-acetaminophen (PERCOCET) 10-325 MG per tablet Take 1 tablet by mouth every 6 (six) hours as needed for pain.    Marland Kitchen. sertraline (ZOLOFT) 100 MG tablet Take 100 mg by mouth daily.    Marland Kitchen. ALPRAZolam (XANAX) 0.25 MG tablet Take 0.25 mg by mouth every 4 (four) hours as needed for anxiety.      No results found for this or any previous visit (from the past 48 hour(s)). No results found.  ROSNO RECENT ILLNESSESS OR HOSPITALIZATIONS  Height 5\' 9"   (1.753 m), weight 107.956 kg (238 lb). Physical Exam  General Appearance:  Alert, cooperative, no distress, appears stated age  Head:  Normocephalic, without obvious abnormality, atraumatic  Eyes:  Pupils equal, conjunctiva/corneas clear,         Throat: Lips, mucosa, and tongue normal; teeth and gums normal  Neck: No visible masses     Lungs:   respirations unlabored  Chest Wall:  No tenderness or deformity  Heart:  Regular rate and rhythm,  Abdomen:   Soft, non-tender,         Extremities: LUE: LONG ARM SPLINT IN PLACE FINGERS WARM WELL PERFUSED ABLE TO EXTEND THUMB AND DIGITS ABLE TO CROSS FINGERS  Pulses: 2+ and symmetric  Skin: Skin color, texture, turgor normal, no rashes or lesions     Neurologic: Normal    Assessment/Plan LEFT ELBOW DISPLACED RADIAL HEAD FRACTURE  LEFT ELBOW OPEN REDUCTION AND INTERNAL FIXATION AND REPAIR AS INDICATED  R/B/A DISCUSSED WITH PT IN OFFICE.  PT VOICED UNDERSTANDING OF PLAN CONSENT SIGNED DAY OF SURGERY PT SEEN AND EXAMINED PRIOR TO OPERATIVE PROCEDURE/DAY OF SURGERY SITE MARKED. QUESTIONS ANSWERED WILL BE ADMITTED FOLLOWING SURGERY OBSERVATION  WE ARE PLANNING SURGERY FOR YOUR UPPER EXTREMITY. THE RISKS AND BENEFITS OF SURGERY INCLUDE BUT NOT LIMITED TO BLEEDING INFECTION, DAMAGE TO NEARBY NERVES ARTERIES TENDONS, FAILURE OF SURGERY TO  ACCOMPLISH ITS INTENDED GOALS, PERSISTENT SYMPTOMS AND NEED FOR FURTHER SURGICAL INTERVENTION. WITH THIS IN MIND WE WILL PROCEED. I HAVE DISCUSSED WITH THE PATIENT THE PRE AND POSTOPERATIVE REGIMEN AND THE DOS AND DON'TS. PT VOICED UNDERSTANDING AND INFORMED CONSENT SIGNED.  Sharma CovertORTMANN,Jaydalee Bardwell W 04/04/2015, 2:52 PM

## 2015-04-04 NOTE — Brief Op Note (Signed)
04/04/2015  2:55 PM  PATIENT:  Brenda Sellers  34 y.o. female  PRE-OPERATIVE DIAGNOSIS:  left elbow proximal radius fracture  POST-OPERATIVE DIAGNOSIS:  SAME  PROCEDURE:  Procedure(s): OPEN REDUCTION INTERNAL FIXATION (ORIF) LEFT ELBOW/OLECRANON FRACTURE (Left)  SURGEON:  Surgeon(s) and Role:    * Bradly BienenstockFred Lanayah Gartley, MD - Primary  PHYSICIAN ASSISTANT:   ASSISTANTS: CHABON PA-C  ANESTHESIA:   general  EBL:     BLOOD ADMINISTERED:none  DRAINS: none   LOCAL MEDICATIONS USED:  NONE  SPECIMEN:  No Specimen  DISPOSITION OF SPECIMEN:  N/A  COUNTS:  YES  TOURNIQUET:    DICTATION: .Other Dictation: Dictation Number [id number]  PLAN OF CARE: Admit for overnight observation  PATIENT DISPOSITION:  PACU - hemodynamically stable.   Delay start of Pharmacological VTE agent (>24hrs) due to surgical blood loss or risk of bleeding: not applicable

## 2015-04-04 NOTE — Anesthesia Procedure Notes (Signed)
Anesthesia Regional Block:  Supraclavicular block  Pre-Anesthetic Checklist: ,, timeout performed, Correct Patient, Correct Site, Correct Laterality, Correct Procedure, Correct Position, site marked, Risks and benefits discussed,  Surgical consent,  Pre-op evaluation,  At surgeon's request and post-op pain management  Laterality: Left  Prep: chloraprep       Needles:  Injection technique: Single-shot  Needle Type: Echogenic Stimulator Needle     Needle Length: 9cm 9 cm Needle Gauge: 21 and 21 G    Additional Needles:  Procedures: ultrasound guided (picture in chart) Supraclavicular block Narrative:  Injection made incrementally with aspirations every 5 mL.  Performed by: Personally  Anesthesiologist: Bethaney Oshana  Additional Notes: Patient tolerated the procedure well without complications

## 2015-04-05 DIAGNOSIS — S52122A Displaced fracture of head of left radius, initial encounter for closed fracture: Secondary | ICD-10-CM | POA: Diagnosis not present

## 2015-04-05 LAB — GLUCOSE, CAPILLARY: Glucose-Capillary: 177 mg/dL — ABNORMAL HIGH (ref 65–99)

## 2015-04-05 MED ORDER — HYDROMORPHONE HCL 2 MG PO TABS
2.0000 mg | ORAL_TABLET | ORAL | Status: DC | PRN
  Administered 2015-04-05: 2 mg via ORAL
  Filled 2015-04-05: qty 1

## 2015-04-05 MED ORDER — HYDROMORPHONE HCL 2 MG PO TABS: 2.0000 mg | ORAL_TABLET | ORAL | Status: DC | PRN

## 2015-04-05 NOTE — Discharge Instructions (Signed)
KEEP BANDAGE CLEAN AND DRY CALL OFFICE FOR F/U APPT [phone] in 12 days [facility] PHONE [phone] KEEP HAND ELEVATED ABOVE HEART OK TO APPLY ICE TO OPERATIVE AREA CONTACT OFFICE IF ANY WORSENING PAIN OR CONCERNS.

## 2015-04-05 NOTE — Discharge Summary (Signed)
Physician Discharge Summary  Patient ID: Brenda Sellers MRN: [id number] DOB/AGE: [birth year] 34 y.o.  Admit date: 04/04/2015 Discharge date: 04/05/2015  Admission Diagnoses: left elbow proximal radius fracture Past Medical History  Diagnosis Date  . Complication of anesthesia   . PONV (postoperative nausea and vomiting)     Last surgery 06/2014- nausea not vomitting was pre medicated.  . Anxiety     Discharge Diagnoses:  Active Problems:   Left radial head fracture   Surgeries: Procedure(s): OPEN REDUCTION INTERNAL FIXATION (ORIF) LEFT ELBOW/OLECRANON FRACTURE on 04/04/2015    Consultants:  NONE  Discharged Condition: Improved  Hospital Course: Brenda Sellers is an 34 y.o. female who was admitted 04/04/2015 with a chief complaint of No chief complaint on file. , and found to have a diagnosis of left elbow proximal radius fracture.  They were brought to the operating room on 04/04/2015 and underwent Procedure(s): OPEN REDUCTION INTERNAL FIXATION (ORIF) LEFT ELBOW/OLECRANON FRACTURE.    They were given perioperative antibiotics: Anti-infectives    Start     Dose/Rate Route Frequency Ordered Stop   04/04/15 2100  clindamycin (CLEOCIN) IVPB 600 mg     600 mg 100 mL/hr over 30 Minutes Intravenous 3 times per day 04/04/15 2006     04/04/15 0800  ceFAZolin (ANCEF) IVPB 2 g/50 mL premix     2 g 100 mL/hr over 30 Minutes Intravenous To Surgery 04/03/15 1325 04/04/15 1546    .  They were given sequential compression devices, early ambulation, and Other (comment)AMBULATION for DVT prophylaxis.  Recent vital signs: Patient Vitals for the past 24 hrs:  BP Temp Temp src Pulse Resp SpO2 Height Weight  04/05/15 0547 (!) 106/52 mmHg 98.3 F (36.8 C) - 76 16 98 % - -  04/05/15 0143 (!) 115/56 mmHg 97.9 F (36.6 C) - 93 16 99 % - -  04/04/15 2003 (!) 103/51 mmHg 98.6 F (37 C) Oral (!) 108 12 100 % - -  04/04/15 1915 - 98.8 F (37.1 C) - (!) 101 (!) 23 98 % - -  04/04/15 1900 114/70  mmHg - - 100 12 97 % - -  04/04/15 1845 116/66 mmHg - - (!) 110 11 98 % - -  04/04/15 1830 133/83 mmHg - - (!) 119 17 94 % - -  04/04/15 1815 129/77 mmHg - - (!) 103 13 97 % - -  04/04/15 1800 133/78 mmHg - - (!) 104 12 97 % - -  04/04/15 1745 123/72 mmHg - - (!) 107 11 97 % - -  04/04/15 1730 126/73 mmHg - - (!) 110 15 97 % - -  04/04/15 1715 (!) 122/109 mmHg - - (!) 116 13 98 % - -  04/04/15 1700 (!) 150/84 mmHg - - (!) 113 18 96 % - -  04/04/15 1515 - - - 89 15 100 % - -  04/04/15 1500 - - - 92 18 100 % - -  04/04/15 1458 110/80 mmHg 98.4 F (36.9 C) Oral 91 20 100 % - -  04/04/15 1447 - - - - - -  (1.753 m) 107.956 kg (238 lb)  .  Recent laboratory studies: No results found.  Discharge Medications:     Medication List    TAKE these medications        acetaminophen 500 MG tablet  Commonly known as:  TYLENOL  Take 1,000 mg by mouth every 6 (six) hours as needed. None since started on Percocet  ALPRAZolam 0.25 MG tablet  Commonly known as:  XANAX  Take 0.25 mg by mouth every 4 (four) hours as needed for anxiety.     diazepam 5 MG tablet  Commonly known as:  VALIUM  Take 5 mg by mouth at bedtime.     docusate sodium 100 MG capsule  Commonly known as:  COLACE  Take 1 capsule (100 mg total) by mouth 2 (two) times daily.     fluconazole 100 MG tablet  Commonly known as:  DIFLUCAN  Take 100 mg by mouth daily.     methocarbamol 500 MG tablet  Commonly known as:  ROBAXIN  Take 1 tablet (500 mg total) by mouth 4 (four) times daily.     ondansetron 4 MG tablet  Commonly known as:  ZOFRAN  Take 1 tablet (4 mg total) by mouth every 8 (eight) hours as needed for nausea or vomiting.     oxyCODONE-acetaminophen 10-325 MG per tablet  Commonly known as:  PERCOCET  Take 1 tablet by mouth every 6 (six) hours as needed for pain.     oxyCODONE-acetaminophen 10-325 MG per tablet  Commonly known as:  PERCOCET  Take 1 tablet by mouth every 4 (four) hours as needed for  pain.     sertraline 100 MG tablet  Commonly known as:  ZOLOFT  Take 100 mg by mouth daily.     vitamin C 500 MG tablet  Commonly known as:  ASCORBIC ACID  Take 1 tablet (500 mg total) by mouth daily.        Diagnostic Studies: Dg Elbow Complete Left  03/24/2015   CLINICAL DATA:  Injury while roller-skating. Fell onto left elbow, with left elbow pain. Initial encounter.  EXAM: LEFT ELBOW - COMPLETE 3+ VIEW  COMPARISON:  None.  FINDINGS: There is a displaced, mildly impacted and comminuted fracture involving the volar aspect of the radial head. An underlying joint effusion is noted. The soft tissues are otherwise grossly unremarkable. No additional fractures are seen.  IMPRESSION: Displaced, mildly impacted and comminuted fracture involving the volar aspect of the radial head. Underlying joint effusion noted.   Electronically Signed   By: Roanna RaiderJeffery  Chang M.D.   On: 03/24/2015 23:35   Ct Elbow Left W/o Cm  03/29/2015   CLINICAL DATA:  Evaluate radial head fracture. Patient fell roller-skating 03/24/2015.  Nonspecific (abnormal) findings on radiological and other examination of musculoskeletal sysem.  EXAM: CT OF THE LEFT ELBOW WITHOUT CONTRAST; 3-DIMENSIONAL CT IMAGE RENDERING ON INDEPENDENT WORKSTATION  TECHNIQUE: Multidetector CT imaging was performed according to the standard protocol. Multiplanar CT image reconstructions were also generated.  COMPARISON:  None.  FINDINGS: There is a mildly depressed radial head fracture involving the articular surface. The fracture courses across the middle of the articular surface. There is minimal depression estimated at between 1.5 and 2 mm. No radial neck fracture. The capitellum is intact.  Mild spurring change along the medial aspect of the proximal ulna. There are also small rounded densities near the coronoid process which could be old avulsion fractures or small unfused secondary ossification centers.  Moderate-sized joint effusion.  No osteochondral  lesion.  IMPRESSION: Minimally depressed intra-articular radial head fracture as discussed above. Please see 3D images.  No other fractures are identified.  Moderate-sized joint effusion.   Electronically Signed   By: Rudie MeyerP.  Gallerani M.D.   On: 03/29/2015 10:59   Ct 3d Independent Annabell SabalWkst  03/29/2015   CLINICAL DATA:  Evaluate radial head fracture. Patient fell roller-skating 03/24/2015.  Nonspecific (abnormal) findings on radiological and other examination of musculoskeletal sysem.  EXAM: CT OF THE LEFT ELBOW WITHOUT CONTRAST; 3-DIMENSIONAL CT IMAGE RENDERING ON INDEPENDENT WORKSTATION  TECHNIQUE: Multidetector CT imaging was performed according to the standard protocol. Multiplanar CT image reconstructions were also generated.  COMPARISON:  None.  FINDINGS: There is a mildly depressed radial head fracture involving the articular surface. The fracture courses across the middle of the articular surface. There is minimal depression estimated at between 1.5 and 2 mm. No radial neck fracture. The capitellum is intact.  Mild spurring change along the medial aspect of the proximal ulna. There are also small rounded densities near the coronoid process which could be old avulsion fractures or small unfused secondary ossification centers.  Moderate-sized joint effusion.  No osteochondral lesion.  IMPRESSION: Minimally depressed intra-articular radial head fracture as discussed above. Please see 3D images.  No other fractures are identified.  Moderate-sized joint effusion.   Electronically Signed   By: Rudie MeyerP.  Gallerani M.D.   On: 03/29/2015 10:59    They benefited maximally from their hospital stay and there were no complications.     Disposition:      Discharge Instructions    Discharge patient    Complete by:  As directed              Signed: Sharma CovertTMANN,Delicia Berens W 04/05/2015, 7:56 AM

## 2015-04-05 NOTE — Progress Notes (Signed)
Dr. Melvyn Novasrtmann called multiple times this afternoon about patient's pain.  Patient was tearful and felt like percocet was not controlling her pain.  Patient stated that dilaudid has helped her in past.  [facility] Dr. Melvyn Novasrtmann was in surgery and they would relay the messages.  Dr. Melvyn Novasrtmann returned call at 1545, orders received.  After giving dilaudid, patient said she was comfortable with her pain.

## 2015-04-05 NOTE — Progress Notes (Signed)
Pt with area of redness to sacral region. Pt states she was told this was a yeast infection and has been treated for it prior to admission. Nursing will continue to monitor.

## 2015-04-05 NOTE — Consult Note (Signed)
   [facility]   04/05/2015  [name] 07/23/1981 [id number]   Came to bedside to speak with patient about Link to Wellness program for [employer] employees/dependents with [employer] insurance. Mrs. Brenda Sellers endorses she works at [employer]. Reports she could benefit from Link to Wellness packet for use in the future for she and her husband. She reports she has issues with hypoglycemia and will like to have information in case she needs Link to Wellness for pre-diabetes or diabetes in the future. Agreeable to post hospital discharge call. Confirmed best contact number as [phone]. Appreciative of visit.   Raiford NobleAtika Hall, MSN-Ed, RN,BSN [facility] Liaison [phone]

## 2015-04-05 NOTE — Op Note (Signed)
NAMThereasa Parkin:  Sellers, Brenda               ACCOUNT NO.:  [id number]  MEDICAL RECORD NO.:  [id number]  LOCATION:  5N31C                        FACILITY:  [facility]  PHYSICIAN:  Sharma CovertFred W. Macey Wurtz IV, M.D.DATE OF BIRTH:  [birth year]  DATE OF PROCEDURE:  04/04/2015 DATE OF DISCHARGE:                              OPERATIVE REPORT   PREOPERATIVE DIAGNOSIS:  Left elbow type 2 proximal radius fracture.  POSTOPERATIVE DIAGNOSIS:  Left elbow type 2 proximal radius fracture.  ATTENDING PHYSICIAN:  Sharma CovertFred W. Alanis Clift, M.D., who was scrubbed and present for the entire procedure.  ASSISTANT SURGEON:  Jaquelyn BitterStephen J. Chabon, PA-C who was scrubbed and present and necessary for fracture reduction, internal fixation, and closure.  SURGICAL PROCEDURE: 1. Open treatment of left elbow proximal radius fracture requiring     internal fixation. 2. Radiographs 3 views, left elbow.  SURGICAL INDICATIONS:  Brenda Sellers is a right-hand-dominant female who sustained a left proximal radius fracture.  The patient was seen and evaluated in the office and given the injury, it was recommended that she undergo the above procedure after risks, benefits, and alternatives were discussed in detail with the patient and signed informed consent was obtained.  Risks include, but not limited to bleeding, infection, damage to nearby nerves, arteries, or tendons, loss of motion to the wrists and digits, incomplete relief of symptoms, nonunion, malunion, hardware failure, and need for further surgical intervention.  DESCRIPTION OF PROCEDURE:  The patient was properly identified in the preoperative holding area and a mark with a permanent marker made on the left elbow to indicate the correct operative site.  The patient was then brought back to the operating room, placed supine on the anesthesia room table where the general anesthetic was administered.  The patient tolerated this well.  A well-padded tourniquet was then placed on the left  brachium and sealed with 1000 drape.  Left upper extremity was then prepped and draped in normal sterile fashion.  Time-out was called.  The correct site was identified and procedure then begun.  Attention was then turned to the left elbow.  The patient received preoperative antibiotics prior to skin incision.  Curvilinear incision was made directly over the lateral aspect of the elbow.  The extensor interval was then approached after the tourniquet had been insufflated.  The joint capsule was then opened up and the fracture site was then exposed. Fracture hematoma was then evacuated.  Careful meticulous dissection was then carried out at the radial head taking down the fracture hematoma and elevating the displaced radial head fracture.  This was elevated with a House curette and a small Therapist, nutritionalreer elevator.  This reduced very nicely taking out the plastic deformation to the head.  Once the deformation was then corrected, 2 guidewires for the mini Acutrak screws were then placed.  These were confirmed using mini C-arm.  After placement of both K-wires, these were appropriately measured, 18 and 20 mm respectively.  Following this, the appropriate drilling for the mini Acutrak screws was then done and then 2 screws were then seated beneath the articular surface very nicely with good compression across and good closure of the fracture site.  Final radiographs were then obtained at the  elbow.  The wound was then thoroughly irrigated.  Thorough wound irrigation done.  The capsule and extensor interval was then closed with 0-Vicryl.  Deep subcutaneous tissue was closed with 2-0 Vicryl. Subcutaneous tissue was closed with 4-0 Vicryl, skin closed with running 4-0 Prolene.  Benzoin and Steri-Strips were applied.  Sterile compressive bandage was then applied.  The patient placed in a long-arm splint, extubated, and taken to the recovery room in good condition.  RADIOGRAPHIC INTERPRETATION:  AP and  lateral views of the elbow and radial head views do show the internal fixation in place.  There was good position in both planes.  SURGICAL IMPLANTS:  Acutrak screws, mini screws 18 mm and 20 mm.  PLAN:  Brenda Sellers is going to be admitted overnight for IV antibiotics and pain control, discharged in the morning, seen back in the office in approximately 10 days for wound check, suture removal, and begin a postoperative ORIF with radial head screw protocol.     Madelynn DoneFred W. Tymeka Privette IV, M.D.     FWO/MEDQ  D:  04/04/2015  T:  04/05/2015  Job:  [id number]

## 2015-04-09 ENCOUNTER — Encounter (HOSPITAL_COMMUNITY): Payer: Self-pay | Admitting: Orthopedic Surgery

## 2015-04-13 ENCOUNTER — Encounter (HOSPITAL_COMMUNITY): Payer: Self-pay | Admitting: Orthopedic Surgery

## 2015-04-16 ENCOUNTER — Encounter (HOSPITAL_COMMUNITY): Payer: Self-pay | Admitting: Orthopedic Surgery

## 2015-08-28 ENCOUNTER — Other Ambulatory Visit (HOSPITAL_COMMUNITY): Payer: Self-pay | Admitting: Orthopedic Surgery

## 2015-08-28 DIAGNOSIS — M75102 Unspecified rotator cuff tear or rupture of left shoulder, not specified as traumatic: Secondary | ICD-10-CM

## 2015-09-03 ENCOUNTER — Ambulatory Visit (HOSPITAL_COMMUNITY)
Admission: RE | Admit: 2015-09-03 | Discharge: 2015-09-03 | Disposition: A | Discharge status: Home / Self Care | Payer: 59 | Source: Ambulatory Visit | Attending: Orthopedic Surgery | Admitting: Orthopedic Surgery

## 2015-09-03 DIAGNOSIS — M7552 Bursitis of left shoulder: Secondary | ICD-10-CM | POA: Diagnosis not present

## 2015-09-03 DIAGNOSIS — M7542 Impingement syndrome of left shoulder: Secondary | ICD-10-CM | POA: Diagnosis present

## 2015-09-03 DIAGNOSIS — M75102 Unspecified rotator cuff tear or rupture of left shoulder, not specified as traumatic: Secondary | ICD-10-CM

## 2015-12-28 DIAGNOSIS — M25552 Pain in left hip: Secondary | ICD-10-CM | POA: Diagnosis not present

## 2016-01-02 ENCOUNTER — Ambulatory Visit: Payer: Self-pay | Admitting: Physician Assistant

## 2016-01-02 ENCOUNTER — Encounter: Payer: Self-pay | Admitting: Physician Assistant

## 2016-01-02 VITALS — BP 118/80 | HR 82 | Temp 98.7°F

## 2016-01-02 DIAGNOSIS — J069 Acute upper respiratory infection, unspecified: Secondary | ICD-10-CM

## 2016-01-02 MED ORDER — CEFDINIR 300 MG PO CAPS: 300.0000 mg | ORAL_CAPSULE | Freq: Two times a day (BID) | ORAL | Status: DC

## 2016-01-02 MED ORDER — PREDNISONE 10 MG PO TABS: 30.0000 mg | ORAL_TABLET | Freq: Every day | ORAL | Status: DC

## 2016-01-02 MED ORDER — ALBUTEROL SULFATE (2.5 MG/3ML) 0.083% IN NEBU: 2.5000 mg | INHALATION_SOLUTION | Freq: Four times a day (QID) | RESPIRATORY_TRACT | Status: DC | PRN

## 2016-01-02 MED ORDER — FLUCONAZOLE 150 MG PO TABS: ORAL_TABLET | ORAL | Status: DC

## 2016-01-02 NOTE — Progress Notes (Signed)
S: C/o runny nose and congestion with ear pain for 3 days, no fever, chills, cp/sob, v/d; mucus is green and thick, cough is sporadic, c/o of facial and dental pain.   Using otc meds: robitussin, delsym, mucinex  O: PE: vitals wnl, nad,  perrl eomi, normocephalic, tms dull, nasal mucosa red and swollen, throat injected, neck supple no lymph, lungs c t a, cv rrr, neuro intact  A:  Acute sinusitis   P: omnicef, diflucan, pred  qd x 3d, albuterol ; drink fluids, continue regular meds , use otc meds of choice, return if not improving in 5 days, return earlier if worsening

## 2016-01-03 MED ORDER — ALBUTEROL SULFATE HFA 108 (90 BASE) MCG/ACT IN AERS: 2.0000 | INHALATION_SPRAY | Freq: Four times a day (QID) | RESPIRATORY_TRACT | Status: DC | PRN

## 2016-01-03 NOTE — Addendum Note (Signed)
Addended by: Faythe Ghee on: 01/03/2016 10:05 AM   Modules accepted: Orders

## 2016-01-03 NOTE — Progress Notes (Signed)
Pt called and got Albuterol nebules instead of inhaler, sent new rx for inhaler

## 2016-01-11 ENCOUNTER — Emergency Department (HOSPITAL_COMMUNITY)
Admission: EM | Admit: 2016-01-11 | Discharge: 2016-01-11 | Disposition: A | Discharge status: Home / Self Care | Payer: 59 | Attending: Emergency Medicine | Admitting: Emergency Medicine

## 2016-01-11 ENCOUNTER — Encounter (HOSPITAL_COMMUNITY): Payer: Self-pay | Admitting: Emergency Medicine

## 2016-01-11 DIAGNOSIS — Z79899 Other long term (current) drug therapy: Secondary | ICD-10-CM | POA: Diagnosis not present

## 2016-01-11 DIAGNOSIS — F419 Anxiety disorder, unspecified: Secondary | ICD-10-CM | POA: Insufficient documentation

## 2016-01-11 DIAGNOSIS — Z87891 Personal history of nicotine dependence: Secondary | ICD-10-CM | POA: Diagnosis not present

## 2016-01-11 DIAGNOSIS — M545 Low back pain: Secondary | ICD-10-CM | POA: Diagnosis present

## 2016-01-11 DIAGNOSIS — R109 Unspecified abdominal pain: Secondary | ICD-10-CM | POA: Diagnosis not present

## 2016-01-11 DIAGNOSIS — M5416 Radiculopathy, lumbar region: Secondary | ICD-10-CM | POA: Insufficient documentation

## 2016-01-11 MED ORDER — METHOCARBAMOL 500 MG PO TABS: 1000.0000 mg | ORAL_TABLET | Freq: Four times a day (QID) | ORAL | Status: DC | PRN

## 2016-01-11 MED ORDER — OXYCODONE-ACETAMINOPHEN 5-325 MG PO TABS
ORAL_TABLET | ORAL | Status: AC
  Filled 2016-01-11: qty 1

## 2016-01-11 MED ORDER — OXYCODONE-ACETAMINOPHEN 5-325 MG PO TABS: ORAL_TABLET | ORAL | Status: DC

## 2016-01-11 MED ORDER — METHYLPREDNISOLONE SODIUM SUCC 125 MG IJ SOLR
125.0000 mg | Freq: Once | INTRAMUSCULAR | Status: AC
  Administered 2016-01-11: 125 mg via INTRAVENOUS
  Filled 2016-01-11: qty 2

## 2016-01-11 MED ORDER — PREDNISONE 20 MG PO TABS: 40.0000 mg | ORAL_TABLET | Freq: Every day | ORAL | Status: DC

## 2016-01-11 MED ORDER — BUPIVACAINE HCL (PF) 0.5 % IJ SOLN
10.0000 mL | Freq: Once | INTRAMUSCULAR | Status: AC
  Administered 2016-01-11: 10 mL
  Filled 2016-01-11: qty 10

## 2016-01-11 MED ORDER — KETOROLAC TROMETHAMINE 30 MG/ML IJ SOLN
30.0000 mg | Freq: Once | INTRAMUSCULAR | Status: AC
  Administered 2016-01-11: 30 mg via INTRAVENOUS
  Filled 2016-01-11: qty 1

## 2016-01-11 MED ORDER — DIAZEPAM 5 MG/ML IJ SOLN
5.0000 mg | Freq: Once | INTRAMUSCULAR | Status: AC
  Administered 2016-01-11: 5 mg via INTRAVENOUS
  Filled 2016-01-11: qty 2

## 2016-01-11 MED ORDER — HYDROMORPHONE HCL 1 MG/ML IJ SOLN
0.5000 mg | Freq: Once | INTRAMUSCULAR | Status: AC
Start: 2016-01-11 — End: 2016-01-11
  Administered 2016-01-11: 0.5 mg via INTRAVENOUS
  Filled 2016-01-11: qty 1

## 2016-01-11 MED ORDER — OXYCODONE-ACETAMINOPHEN 5-325 MG PO TABS
1.0000 | ORAL_TABLET | Freq: Once | ORAL | Status: AC
  Administered 2016-01-11: 1 via ORAL

## 2016-01-11 NOTE — ED Notes (Signed)
Pt reports extremely painful lower back pain from unknown cause. Pt alert x4. Pt moving all extremities.

## 2016-01-11 NOTE — ED Notes (Signed)
Pt ambulates independently and with steady gait at time of discharge. Discharge instructions and follow up information reviewed with patient. No other questions or concerns voiced at this time. RX x 3 given. 

## 2016-01-11 NOTE — ED Provider Notes (Signed)
CSN: [id number]     Arrival date & time 01/11/16  1201 History  By signing my name below, I, Brenda Sellers, attest that this documentation has been prepared under the direction and in the presence of Brenda Emery, PA-C Electronically Signed: Soijett Sellers, ED Scribe. 01/11/2016. 1:44 PM.   Chief Complaint  Patient presents with  . Back Pain      The history is provided by the patient. No language interpreter was used.    HPI Comments: Brenda Sellers is a 35 y.o. female who presents to the Emergency Department complaining of severe lower back pain onset this morning PTA. She notes that she woke up this morning and she was unable to get out of the bed due to the pain. She states that there is pain to her lower back with movement and deep breathing. Pt reports that she came from the ED from CCU where her father-in-law is at. Pt denies any injury/trauma. She reports that the back pain does radiate to her right sided abdomen and right LE. She states that she is having associated symptoms of "numbness to right toes." She states that she has tried percocet while in the ED for the relief for her symptoms. Pt denies bowel/bladder incontinence, fever, cauda equina symptoms, hematuria, and any other symptoms. Denies CA or IV drug use. Denies allergies to medications.  Pt PCP: Dr. Bethann Punches  Past Medical History  Diagnosis Date  . Complication of anesthesia   . PONV (postoperative nausea and vomiting)     Last surgery 06/2014- nausea not vomitting was pre medicated.  . Anxiety    Past Surgical History  Procedure Laterality Date  . Laparoscopic total hysterectomy  07/04/14  . Tonsillectomy    . Knee arthroscopy Right     Menicus  . Abdominal hysterectomy    . Cholecystectomy    . Anterior cruciate ligament repair Left 1998    Menicus, PCL Screw  . Orif elbow fracture Left 04/04/2015    Procedure: OPEN REDUCTION INTERNAL FIXATION (ORIF) LEFT ELBOW/OLECRANON FRACTURE;  Surgeon: Brenda Bienenstock,  MD;  Location: [facility] OR;  Service: Orthopedics;  Laterality: Left;   No family history on file. Social History  Substance Use Topics  . Smoking status: Former Smoker -- 10 years  . Smokeless tobacco: None     Comment: Quit 2006  . Alcohol Use: No   OB History    No data available     Review of Systems  A complete 10 system review of systems was obtained and all systems are negative except as noted in the HPI and PMH.   Allergies  Augmentin and Vicodin  Home Medications   Prior to Admission medications   Medication Sig Start Date End Date Taking? Authorizing Provider  albuterol (PROVENTIL HFA;VENTOLIN HFA) 108 (90 Base) MCG/ACT inhaler Inhale 2 puffs into the lungs every 6 (six) hours as needed for wheezing or shortness of breath. 01/03/16   Faythe Ghee, PA-C  ALPRAZolam Prudy Feeler) 0.25 MG tablet Take 0.25 mg by mouth every 4 (four) hours as needed for anxiety.    Historical Provider, MD  buPROPion (WELLBUTRIN) 100 MG tablet Take 100 mg by mouth 2 (two) times daily.    Historical Provider, MD  cefdinir (OMNICEF) 300 MG capsule Take 1 capsule (300 mg total) by mouth 2 (two) times daily. 01/02/16   Faythe Ghee, PA-C  diazepam (VALIUM) 5 MG tablet Take 5 mg by mouth at bedtime.    Historical Provider, MD  escitalopram (  LEXAPRO) 10 MG tablet Take 10 mg by mouth daily.    Historical Provider, MD  fluconazole (DIFLUCAN) 150 MG tablet Take one now and one in a week 01/02/16   Faythe Ghee, PA-C  methocarbamol (ROBAXIN) 500 MG tablet Take 2 tablets (1,000 mg total) by mouth 4 (four) times daily as needed (Pain). 01/11/16   Brenda Acero, PA-C  miconazole (MICOTIN) 2 % powder Apply 1 application topically as needed for itching (rash). Reported on 01/02/2016    Historical Provider, MD  oxyCODONE-acetaminophen (PERCOCET/ROXICET) 5-325 MG tablet 1 to 2 tabs PO q6hrs  PRN for pain 01/11/16   Brenda Sellers Emelda Kohlbeck, PA-C  predniSONE (DELTASONE) 20 MG tablet Take 2 tablets (40 mg total) by mouth daily.  01/11/16   Brenda Busser, PA-C  sertraline (ZOLOFT) 100 MG tablet Take 100 mg by mouth daily. Reported on 01/02/2016    Historical Provider, MD  vitamin C (ASCORBIC ACID) 500 MG tablet Take 1 tablet (500 mg total) by mouth daily. 04/04/15   Brenda Bienenstock, MD   BP 138/76 mmHg  Pulse 85  Temp(Src) 98.1 F (36.7 C) (Oral)  Resp 16  SpO2 100% Physical Exam  Constitutional: She is oriented to person, place, and time. She appears well-developed and well-nourished.  Appears to be in acute pain, resists movement.  HENT:  Head: Normocephalic and atraumatic.  Mouth/Throat: Oropharynx is clear and moist.  Eyes: Conjunctivae and EOM are normal.  Neck: Normal range of motion. Neck supple.  Cardiovascular: Normal rate, regular rhythm and intact distal pulses.   Pulmonary/Chest: Effort normal and breath sounds normal. No respiratory distress. She has no wheezes. She has no rales. She exhibits no tenderness.  Abdominal: Soft. She exhibits no distension and no mass. There is tenderness. There is no rebound and no guarding.  Musculoskeletal: Normal range of motion.       Arms: Neurological: She is alert and oriented to person, place, and time.  + point tenderness to percussion of right lumbar spinal processes.  No TTP or paraspinal muscular spasm. Strength is 5 out of 5 to bilateral lower extremities at hip and knee; extensor hallucis longus 5 out of 5. Ankle strength 5 out of 5, no clonus, neurovascularly intact. No saddle anaesthesia. Patellar reflexes are 2+ bilaterally.    Normal rectal tone   Skin: Skin is warm and dry.  Nursing note and vitals reviewed.   ED Course  Procedures (including critical care time)  Procedure Note: Trigger Point Injection for Myofascial pain  Performed by Brenda Sellers, Brenda Deriso, PA-C  Indication: muscle/myofascial pain Muscle body and tendon sheath of the right erector spinae muscle(s) were injected with 7mL 0.5% bupivacaine under sterile technique for release of muscle  spasm/pain. Patient tolerated well with immediate improvement of symptoms and no immediate complications following procedure.  CPT Code:  1 or 2 muscle bodies: 20552   DIAGNOSTIC STUDIES: Oxygen Saturation is 100% on RA, nl by my interpretation.    COORDINATION OF CARE: 1:44 PM Discussed treatment plan with pt at bedside which includes Valium as a muscle relaxer, trigger point injection and pt agreed to plan.   Labs Review Labs Reviewed - No data to display  Imaging Review No results found. I have personally reviewed and evaluated these images and lab results as part of my medical decision-making.   EKG Interpretation None      MDM   Final diagnoses:  Lumbar radiculopathy, acute   Filed Vitals:   01/11/16 1215 01/11/16 1530 01/11/16 1705  BP: 137/59 107/59 138/76  Pulse: 105 85 85  Temp: 98.3 F (36.8 C)  98.1 F (36.7 C)  TempSrc: Oral  Oral  Resp: SpO2: 100% 100% 100%    Medications  oxyCODONE-acetaminophen (PERCOCET/ROXICET) 5-325 MG per tablet 1 tablet (1 tablet Oral Given 01/11/16 1219)  bupivacaine (MARCAINE) 0.5 % injection 10 mL (10 mLs Infiltration Given by Other 01/11/16 1501)  diazepam (VALIUM) injection 5 mg (5 mg Intravenous Given 01/11/16 1421)  methylPREDNISolone sodium succinate (SOLU-MEDROL) 125 mg/2 mL injection 125 mg (125 mg Intravenous Given 01/11/16 1541)  ketorolac (TORADOL) 30 MG/ML injection 30 mg (30 mg Intravenous Given 01/11/16 1541)  HYDROmorphone (DILAUDID) injection 0.5 mg (0.5 mg Intravenous Given 01/11/16 1627)    CARIE KAPUSCINSKI is 35 y.o. female presenting with severe radiating back pain.  No neurological deficits and normal neuro exam.  Patient can walk but states is painful.  No loss of bowel or bladder control.  No concern for cauda equina.  No fever, night sweats, weight loss, h/o cancer, IVDU.  RICE protocol and pain medicine indicated and discussed with patient.  Discussed case with attending physician who agrees with  care plan and disposition.   Evaluation does not show pathology that would require ongoing emergent intervention or inpatient treatment. Pt is hemodynamically stable and mentating appropriately. Discussed findings and plan with patient/guardian, who agrees with care plan. All questions answered. Return precautions discussed and outpatient follow up given.   Discharge Medication List as of 01/11/2016  4:50 PM    START taking these medications   Details  oxyCODONE-acetaminophen (PERCOCET/ROXICET) 5-325 MG tablet 1 to 2 tabs PO q6hrs  PRN for pain, Print            Brenda Emery, PA-C 01/11/16 2047  Lyndal Pulley, MD 01/13/16 [phone]

## 2016-01-11 NOTE — Discharge Instructions (Signed)
Please follow with your primary care doctor in the next 2 days for a check-up. They must obtain records for further management.  ° °Do not hesitate to return to the Emergency Department for any new, worsening or concerning symptoms.  ° °

## 2016-01-24 DIAGNOSIS — M5416 Radiculopathy, lumbar region: Secondary | ICD-10-CM | POA: Diagnosis not present

## 2016-01-29 ENCOUNTER — Other Ambulatory Visit: Payer: Self-pay | Admitting: Neurological Surgery

## 2016-01-29 DIAGNOSIS — M5416 Radiculopathy, lumbar region: Secondary | ICD-10-CM

## 2016-02-01 ENCOUNTER — Encounter: Payer: Self-pay | Admitting: Medical Oncology

## 2016-02-01 ENCOUNTER — Emergency Department
Admission: EM | Admit: 2016-02-01 | Discharge: 2016-02-01 | Disposition: A | Discharge status: Home / Self Care | Payer: 59 | Attending: Emergency Medicine | Admitting: Emergency Medicine

## 2016-02-01 ENCOUNTER — Emergency Department: Payer: 59

## 2016-02-01 DIAGNOSIS — Z7952 Long term (current) use of systemic steroids: Secondary | ICD-10-CM | POA: Insufficient documentation

## 2016-02-01 DIAGNOSIS — Z87891 Personal history of nicotine dependence: Secondary | ICD-10-CM | POA: Insufficient documentation

## 2016-02-01 DIAGNOSIS — K760 Fatty (change of) liver, not elsewhere classified: Secondary | ICD-10-CM | POA: Diagnosis not present

## 2016-02-01 DIAGNOSIS — R109 Unspecified abdominal pain: Secondary | ICD-10-CM | POA: Diagnosis not present

## 2016-02-01 DIAGNOSIS — Z90710 Acquired absence of both cervix and uterus: Secondary | ICD-10-CM | POA: Diagnosis not present

## 2016-02-01 DIAGNOSIS — Z9049 Acquired absence of other specified parts of digestive tract: Secondary | ICD-10-CM | POA: Insufficient documentation

## 2016-02-01 DIAGNOSIS — R1031 Right lower quadrant pain: Secondary | ICD-10-CM | POA: Diagnosis not present

## 2016-02-01 DIAGNOSIS — Z79899 Other long term (current) drug therapy: Secondary | ICD-10-CM | POA: Diagnosis not present

## 2016-02-01 DIAGNOSIS — R197 Diarrhea, unspecified: Secondary | ICD-10-CM | POA: Diagnosis not present

## 2016-02-01 LAB — URINALYSIS COMPLETE WITH MICROSCOPIC (ARMC ONLY)
BACTERIA UA: NONE SEEN
Bilirubin Urine: NEGATIVE
Glucose, UA: NEGATIVE mg/dL
HGB URINE DIPSTICK: NEGATIVE
Ketones, ur: NEGATIVE mg/dL
LEUKOCYTES UA: NEGATIVE
Nitrite: NEGATIVE
PROTEIN: NEGATIVE mg/dL
RBC / HPF: NONE SEEN RBC/hpf (ref 0–5)
Specific Gravity, Urine: 1.015 (ref 1.005–1.030)
pH: 5 (ref 5.0–8.0)

## 2016-02-01 LAB — CBC
HEMATOCRIT: 41.6 % (ref 35.0–47.0)
Hemoglobin: 14.2 g/dL (ref 12.0–16.0)
MCH: 29.4 pg (ref 26.0–34.0)
MCHC: 34.3 g/dL (ref 32.0–36.0)
MCV: 85.7 fL (ref 80.0–100.0)
PLATELETS: 210 10*3/uL (ref 150–440)
RBC: 4.85 MIL/uL (ref 3.80–5.20)
RDW: 12.8 % (ref 11.5–14.5)
WBC: 7.6 10*3/uL (ref 3.6–11.0)

## 2016-02-01 LAB — COMPREHENSIVE METABOLIC PANEL
ALT: 31 U/L (ref 14–54)
AST: 29 U/L (ref 15–41)
Albumin: 4.4 g/dL (ref 3.5–5.0)
Alkaline Phosphatase: 42 U/L (ref 38–126)
Anion gap: 9 (ref 5–15)
BILIRUBIN TOTAL: 0.7 mg/dL (ref 0.3–1.2)
BUN: 9 mg/dL (ref 6–20)
CHLORIDE: 102 mmol/L (ref 101–111)
CO2: 27 mmol/L (ref 22–32)
Calcium: 9 mg/dL (ref 8.9–10.3)
Creatinine, Ser: 0.65 mg/dL (ref 0.44–1.00)
GFR calc non Af Amer: >60 mL/min (ref >60)
Glucose, Bld: 104 mg/dL — ABNORMAL HIGH (ref 65–99)
Potassium: 4.1 mmol/L (ref 3.5–5.1)
Sodium: 138 mmol/L (ref 135–145)
TOTAL PROTEIN: 6.8 g/dL (ref 6.5–8.1)

## 2016-02-01 LAB — LIPASE, BLOOD: LIPASE: 16 U/L (ref 11–51)

## 2016-02-01 MED ORDER — MORPHINE SULFATE (PF) 4 MG/ML IV SOLN
INTRAVENOUS | Status: AC
  Administered 2016-02-01: 4 mg via INTRAVENOUS
  Filled 2016-02-01: qty 1

## 2016-02-01 MED ORDER — SODIUM CHLORIDE 0.9 % IV BOLUS (SEPSIS)
1000.0000 mL | Freq: Once | INTRAVENOUS | Status: AC
  Administered 2016-02-01: 1000 mL via INTRAVENOUS

## 2016-02-01 MED ORDER — MORPHINE SULFATE (PF) 4 MG/ML IV SOLN
4.0000 mg | Freq: Once | INTRAVENOUS | Status: AC
  Administered 2016-02-01: 4 mg via INTRAVENOUS

## 2016-02-01 MED ORDER — IOHEXOL 300 MG/ML  SOLN
100.0000 mL | Freq: Once | INTRAMUSCULAR | Status: AC | PRN
  Administered 2016-02-01: 100 mL via INTRAVENOUS

## 2016-02-01 MED ORDER — IOHEXOL 240 MG/ML SOLN
25.0000 mL | Freq: Once | INTRAMUSCULAR | Status: AC | PRN
  Administered 2016-02-01: 25 mL via ORAL

## 2016-02-01 MED ORDER — ONDANSETRON HCL 4 MG/2ML IJ SOLN
4.0000 mg | Freq: Once | INTRAMUSCULAR | Status: AC
  Administered 2016-02-01: 4 mg via INTRAVENOUS

## 2016-02-01 MED ORDER — MORPHINE SULFATE (PF) 4 MG/ML IV SOLN
4.0000 mg | Freq: Once | INTRAVENOUS | Status: AC
Start: 2016-02-01 — End: 2016-02-01
  Administered 2016-02-01: 4 mg via INTRAVENOUS
  Filled 2016-02-01: qty 1

## 2016-02-01 MED ORDER — OXYCODONE HCL 5 MG PO TABS: 5.0000 mg | ORAL_TABLET | Freq: Four times a day (QID) | ORAL | Status: AC | PRN

## 2016-02-01 MED ORDER — DICYCLOMINE HCL 20 MG PO TABS: 20.0000 mg | ORAL_TABLET | Freq: Three times a day (TID) | ORAL | Status: DC | PRN

## 2016-02-01 MED ORDER — ONDANSETRON HCL 4 MG PO TABS: 4.0000 mg | ORAL_TABLET | Freq: Three times a day (TID) | ORAL | Status: DC | PRN

## 2016-02-01 MED ORDER — ONDANSETRON HCL 4 MG/2ML IJ SOLN
INTRAMUSCULAR | Status: AC
  Administered 2016-02-01: 4 mg via INTRAVENOUS
  Filled 2016-02-01: qty 2

## 2016-02-01 MED ORDER — ONDANSETRON HCL 4 MG/2ML IJ SOLN
INTRAMUSCULAR | Status: AC
Start: 2016-02-01 — End: 2016-02-01
  Administered 2016-02-01: 4 mg via INTRAVENOUS
  Filled 2016-02-01: qty 2

## 2016-02-01 MED ORDER — METOPROLOL TARTRATE 1 MG/ML IV SOLN: 5.0000 mg | Freq: Once | INTRAVENOUS | Status: DC

## 2016-02-01 NOTE — Discharge Instructions (Signed)
Please seek medical attention for any high fevers, chest pain, shortness of breath, change in behavior, persistent vomiting, bloody stool or any other new or concerning symptoms. ° ° °Abdominal Pain, Adult °Many things can cause belly (abdominal) pain. Most times, the belly pain is not dangerous. Many cases of belly pain can be watched and treated at home. °HOME CARE  °· Do not take medicines that help you go poop (laxatives) unless told to by your doctor. °· Only take medicine as told by your doctor. °· Eat or drink as told by your doctor. Your doctor will tell you if you should be on a special diet. °GET HELP IF: °· You do not know what is causing your belly pain. °· You have belly pain while you are sick to your stomach (nauseous) or have runny poop (diarrhea). °· You have pain while you pee or poop. °· Your belly pain wakes you up at night. °· You have belly pain that gets worse or better when you eat. °· You have belly pain that gets worse when you eat fatty foods. °· You have a fever. °GET HELP RIGHT AWAY IF:  °· The pain does not go away within 2 hours. °· You keep throwing up (vomiting). °· The pain changes and is only in the right or left part of the belly. °· You have bloody or tarry looking poop. °MAKE SURE YOU:  °· Understand these instructions. °· Will watch your condition. °· Will get help right away if you are not doing well or get worse. °  °This information is not intended to replace advice given to you by your health care provider. Make sure you discuss any questions you have with your health care provider. °  °Document Released: 04/28/2008 Document Revised: 12/01/2014 Document Reviewed: 07/20/2013 °Elsevier Interactive Patient Education ©2016 [employer] ° °

## 2016-02-01 NOTE — ED Notes (Signed)
Patient transported to CT 

## 2016-02-01 NOTE — ED Notes (Signed)
Pt reports rt lower abd pain that began about 2 days ago with vomiting, diarrhea and fever.

## 2016-02-01 NOTE — ED Provider Notes (Signed)
[facility] Emergency Department Provider Note    ____________________________________________  Time seen: ~1525  I have reviewed the triage vital signs and the nursing notes.   HISTORY  Chief Complaint Abdominal Pain; Emesis; Diarrhea; and Fever   History limited by: Not Limited   HPI Brenda Sellers is a 35 y.o. female with history of hysterectomy, cholecystectomy presents to the emergency department today because of concerns for abdominal pain. Started 2 days ago. Initially was generalized. It is now concentrated in the right lower quadrant. She describes it as being severe. Has been fairly constant. It has been accompanied by vomiting and diarrhea. She thinks she might have seen some blood in the diarrhea. Additionally she has had decrease in appetite. She has had low-grade fevers at home and has taken over-the-counter medications. She denies any recent antibiotic use.     Past Medical History  Diagnosis Date  . Complication of anesthesia   . PONV (postoperative nausea and vomiting)     Last surgery 06/2014- nausea not vomitting was pre medicated.  . Anxiety     Patient Active Problem List   Diagnosis Date Noted  . Left radial head fracture 04/04/2015    Past Surgical History  Procedure Laterality Date  . Laparoscopic total hysterectomy  07/04/14  . Tonsillectomy    . Knee arthroscopy Right     Menicus  . Abdominal hysterectomy    . Cholecystectomy    . Anterior cruciate ligament repair Left 1998    Menicus, PCL Screw  . Orif elbow fracture Left 04/04/2015    Procedure: OPEN REDUCTION INTERNAL FIXATION (ORIF) LEFT ELBOW/OLECRANON FRACTURE;  Surgeon: Bradly Bienenstock, MD;  Location: [facility] OR;  Service: Orthopedics;  Laterality: Left;    Current Outpatient Rx  Name  Route  Sig  Dispense  Refill  . albuterol (PROVENTIL HFA;VENTOLIN HFA) 108 (90 Base) MCG/ACT inhaler   Inhalation   Inhale 2 puffs into the lungs every 6 (six) hours as needed for  wheezing or shortness of breath.   1 Inhaler   0   . ALPRAZolam (XANAX) 0.25 MG tablet   Oral   Take 0.25 mg by mouth every 4 (four) hours as needed for anxiety.         Marland Kitchen buPROPion (WELLBUTRIN) 100 MG tablet   Oral   Take 100 mg by mouth 2 (two) times daily.         . cefdinir (OMNICEF) 300 MG capsule   Oral   Take 1 capsule (300 mg total) by mouth 2 (two) times daily.   20 capsule   0   . diazepam (VALIUM) 5 MG tablet   Oral   Take 5 mg by mouth at bedtime.         Marland Kitchen escitalopram (LEXAPRO) 10 MG tablet   Oral   Take 10 mg by mouth daily.         . fluconazole (DIFLUCAN) 150 MG tablet      Take one now and one in a week   2 tablet   0   . methocarbamol (ROBAXIN) 500 MG tablet   Oral   Take 2 tablets (1,000 mg total) by mouth 4 (four) times daily as needed (Pain).   20 tablet   0   . miconazole (MICOTIN) 2 % powder   Topical   Apply 1 application topically as needed for itching (rash). Reported on 01/02/2016         . oxyCODONE-acetaminophen (PERCOCET/ROXICET) 5-325 MG tablet  1 to 2 tabs PO q6hrs  PRN for pain   15 tablet   0   . predniSONE (DELTASONE) 20 MG tablet   Oral   Take 2 tablets (40 mg total) by mouth daily.   10 tablet   0   . sertraline (ZOLOFT) 100 MG tablet   Oral   Take 100 mg by mouth daily. Reported on 01/02/2016         . vitamin C (ASCORBIC ACID) 500 MG tablet   Oral   Take 1 tablet (500 mg total) by mouth daily.   50 tablet   0     Allergies Augmentin and Vicodin  No family history on file.  Social History Social History  Substance Use Topics  . Smoking status: Former Smoker -- 10 years  . Smokeless tobacco: None     Comment: Quit 2006  . Alcohol Use: No    Review of Systems  Constitutional: Negative for fever. Cardiovascular: Negative for chest pain. Respiratory: Negative for shortness of breath. Gastrointestinal: Positive for rlq abdominal pain, diarrhea, vomiting Neurological: Negative for  headaches, focal weakness or numbness.   10-point ROS otherwise negative.  ____________________________________________   PHYSICAL EXAM:  VITAL SIGNS: ED Triage Vitals  Enc Vitals Group     BP 02/01/16 1336 137/58 mmHg     Pulse Rate 02/01/16 1336 102     Resp 02/01/16 1336 18     Temp 02/01/16 1336 98.2 F (36.8 C)     Temp Source 02/01/16 1336 Oral     SpO2 02/01/16 1336 96 %     Weight 02/01/16 1336 240 lb (108.863 kg)     Height 02/01/16 1336  (1.753 m)     Head Cir --      Peak Flow --      Pain Score 02/01/16 1336 8   Constitutional: Alert and oriented. Well appearing and in no distress. Eyes: Conjunctivae are normal. PERRL. Normal extraocular movements. ENT   Head: Normocephalic and atraumatic.   Nose: No congestion/rhinnorhea.   Mouth/Throat: Mucous membranes are moist.   Neck: No stridor. Hematological/Lymphatic/Immunilogical: No cervical lymphadenopathy. Cardiovascular: Normal rate, regular rhythm.  No murmurs, rubs, or gallops. Respiratory: Normal respiratory effort without tachypnea nor retractions. Breath sounds are clear and equal bilaterally. No wheezes/rales/rhonchi. Gastrointestinal: Soft and tender to palpation in the right lower quadrant. Positive rebound. Negative guarding. Positive rovsings sign.  Genitourinary: Deferred Musculoskeletal: Normal range of motion in all extremities. No joint effusions.  No lower extremity tenderness nor edema. Neurologic:  Normal speech and language. No gross focal neurologic deficits are appreciated.  Skin:  Skin is warm, dry and intact. No rash noted. Psychiatric: Mood and affect are normal. Speech and behavior are normal. Patient exhibits appropriate insight and judgment.  ____________________________________________    LABS (pertinent positives/negatives)  Labs Reviewed  COMPREHENSIVE METABOLIC PANEL - Abnormal; Notable for the following:    Glucose, Bld 104 (*)    All other components within  normal limits  URINALYSIS COMPLETEWITH MICROSCOPIC ([facility] ONLY) - Abnormal; Notable for the following:    Color, Urine YELLOW (*)    APPearance CLEAR (*)    Squamous Epithelial / LPF 0-5 (*)    All other components within normal limits  LIPASE, BLOOD  CBC     ____________________________________________   EKG  None  ____________________________________________    RADIOLOGY  CT abd/pel IMPRESSION: 1. No acute or focal lesion to explain the patient's symptoms. 2. Mild diffuse fatty infiltration of the liver. 3.  Stable pericardial cyst and left hepatic cyst. 4. Cholecystectomy. 5. Hysterectomy and probable bilateral oophorectomy.  ____________________________________________   PROCEDURES  Procedure(s) performed: None  Critical Care performed: No  ____________________________________________   INITIAL IMPRESSION / ASSESSMENT AND PLAN / ED COURSE  Pertinent labs & imaging results that were available during my care of the patient were reviewed by me and considered in my medical decision making (see chart for details).  Patient presented to the emergency department today because of concerns for right lower quadrant pain. CT scan did not show any appendicitis. No other ENT is etiology of pain. Given the patient did have some nausea vomiting and diarrhea do wonder if patient so gastroenteritis. Will plan on giving patient pain medication and nausea medication.   ____________________________________________   FINAL CLINICAL IMPRESSION(S) / ED DIAGNOSES  Final diagnoses:  Abdominal pain, unspecified abdominal location  Diarrhea, unspecified type     Phineas SemenGraydon Halayna Blane, MD 02/01/16 2017

## 2016-02-03 ENCOUNTER — Ambulatory Visit
Admission: RE | Admit: 2016-02-03 | Discharge: 2016-02-03 | Disposition: A | Discharge status: Home / Self Care | Payer: No Typology Code available for payment source | Source: Ambulatory Visit | Attending: Neurological Surgery | Admitting: Neurological Surgery

## 2016-02-03 DIAGNOSIS — M545 Low back pain: Secondary | ICD-10-CM | POA: Diagnosis not present

## 2016-02-03 DIAGNOSIS — M5416 Radiculopathy, lumbar region: Secondary | ICD-10-CM

## 2016-02-05 DIAGNOSIS — J40 Bronchitis, not specified as acute or chronic: Secondary | ICD-10-CM | POA: Diagnosis not present

## 2016-02-05 DIAGNOSIS — K5792 Diverticulitis of intestine, part unspecified, without perforation or abscess without bleeding: Secondary | ICD-10-CM | POA: Diagnosis not present

## 2016-07-16 DIAGNOSIS — G5791 Unspecified mononeuropathy of right lower limb: Secondary | ICD-10-CM | POA: Diagnosis not present

## 2016-07-16 DIAGNOSIS — G5792 Unspecified mononeuropathy of left lower limb: Secondary | ICD-10-CM | POA: Diagnosis not present

## 2016-08-05 DIAGNOSIS — G5791 Unspecified mononeuropathy of right lower limb: Secondary | ICD-10-CM | POA: Diagnosis not present

## 2016-08-05 DIAGNOSIS — G5792 Unspecified mononeuropathy of left lower limb: Secondary | ICD-10-CM | POA: Diagnosis not present

## 2016-08-17 ENCOUNTER — Emergency Department: Payer: 59

## 2016-08-17 ENCOUNTER — Encounter: Payer: Self-pay | Admitting: Emergency Medicine

## 2016-08-17 ENCOUNTER — Emergency Department
Admission: EM | Admit: 2016-08-17 | Discharge: 2016-08-17 | Disposition: A | Discharge status: Home / Self Care | Payer: 59 | Attending: Emergency Medicine | Admitting: Emergency Medicine

## 2016-08-17 DIAGNOSIS — R002 Palpitations: Secondary | ICD-10-CM | POA: Diagnosis not present

## 2016-08-17 DIAGNOSIS — M7989 Other specified soft tissue disorders: Secondary | ICD-10-CM | POA: Diagnosis not present

## 2016-08-17 DIAGNOSIS — R2243 Localized swelling, mass and lump, lower limb, bilateral: Secondary | ICD-10-CM | POA: Insufficient documentation

## 2016-08-17 DIAGNOSIS — R0602 Shortness of breath: Secondary | ICD-10-CM | POA: Diagnosis not present

## 2016-08-17 DIAGNOSIS — Z87891 Personal history of nicotine dependence: Secondary | ICD-10-CM | POA: Insufficient documentation

## 2016-08-17 DIAGNOSIS — R0600 Dyspnea, unspecified: Secondary | ICD-10-CM

## 2016-08-17 HISTORY — DX: Neuralgia and neuritis, unspecified: M79.2

## 2016-08-17 HISTORY — DX: Other specified congenital malformations of heart: Q24.8

## 2016-08-17 LAB — BASIC METABOLIC PANEL
Anion gap: 6 (ref 5–15)
BUN: 9 mg/dL (ref 6–20)
CO2: 29 mmol/L (ref 22–32)
Calcium: 9.6 mg/dL (ref 8.9–10.3)
Chloride: 104 mmol/L (ref 101–111)
Creatinine, Ser: 0.85 mg/dL (ref 0.44–1.00)
GFR calc Af Amer: >60 mL/min (ref >60)
GFR calc non Af Amer: >60 mL/min (ref >60)
GLUCOSE: 108 mg/dL — AB (ref 65–99)
POTASSIUM: 4 mmol/L (ref 3.5–5.1)
SODIUM: 139 mmol/L (ref 135–145)

## 2016-08-17 LAB — CBC
HEMATOCRIT: 40.7 % (ref 35.0–47.0)
HEMOGLOBIN: 14.1 g/dL (ref 12.0–16.0)
MCH: 30 pg (ref 26.0–34.0)
MCHC: 34.5 g/dL (ref 32.0–36.0)
MCV: 86.8 fL (ref 80.0–100.0)
Platelets: 268 10*3/uL (ref 150–440)
RBC: 4.69 MIL/uL (ref 3.80–5.20)
RDW: 12.4 % (ref 11.5–14.5)
WBC: 8.1 10*3/uL (ref 3.6–11.0)

## 2016-08-17 LAB — TROPONIN I: Troponin I: <0.03 ng/mL (ref <0.03)

## 2016-08-17 LAB — FIBRIN DERIVATIVES D-DIMER (ARMC ONLY): Fibrin derivatives D-dimer (ARMC): 552 — ABNORMAL HIGH (ref 0–499)

## 2016-08-17 MED ORDER — METOPROLOL TARTRATE 25 MG PO TABS: 25.0000 mg | ORAL_TABLET | Freq: Two times a day (BID) | ORAL | 11 refills | Status: DC

## 2016-08-17 MED ORDER — IOPAMIDOL (ISOVUE-370) INJECTION 76%
75.0000 mL | Freq: Once | INTRAVENOUS | Status: AC | PRN
  Administered 2016-08-17: 75 mL via INTRAVENOUS
  Filled 2016-08-17: qty 75

## 2016-08-17 MED ORDER — DIAZEPAM 5 MG PO TABS
10.0000 mg | ORAL_TABLET | Freq: Once | ORAL | Status: AC
  Administered 2016-08-17: 10 mg via ORAL
  Filled 2016-08-17: qty 2

## 2016-08-17 NOTE — ED Triage Notes (Signed)
Pt has multiple complaints including bil leg swelling, feeling sob, gerd,

## 2016-08-17 NOTE — ED Notes (Signed)
Pt could not find ride home. Pt given valium to take when home to ensure she is safe to drive 40 minutes. Pt verbalized understanding of medication administration and not to drive home at this time.

## 2016-08-17 NOTE — ED Provider Notes (Signed)
[facility] Emergency Department Provider Note        Time seen: ----------------------------------------- 4:33 PM on 08/17/2016 -----------------------------------------    I have reviewed the triage vital signs and the nursing notes.   HISTORY  Chief Complaint Leg Swelling; Gastroesophageal Reflux; and Shortness of Breath    HPI Brenda Sellers is a 35 y.o. female who presents to ER with multiple complaints.Patient states her legs been swollen as well as she has had some fast heartbeat and dyspnea on exertion. Patient states these are unusual symptoms for her, exertion makes her symptoms worse. Patient denies any recent illness, states just got back from vacation. Patient is also had some reflux symptoms. States the symptoms are unusual for her.   Past Medical History:  Diagnosis Date  . Anxiety   . Complication of anesthesia   . Nerve pain   . PONV (postoperative nausea and vomiting)    Last surgery 06/2014- nausea not vomitting was pre medicated.    Patient Active Problem List   Diagnosis Date Noted  . Left radial head fracture 04/04/2015    Past Surgical History:  Procedure Laterality Date  . ABDOMINAL HYSTERECTOMY    . ANTERIOR CRUCIATE LIGAMENT REPAIR Left 1998   Menicus, PCL Screw  . CHOLECYSTECTOMY    . KNEE ARTHROSCOPY Right    Menicus  . LAPAROSCOPIC TOTAL HYSTERECTOMY  07/04/14  . ORIF ELBOW FRACTURE Left 04/04/2015   Procedure: OPEN REDUCTION INTERNAL FIXATION (ORIF) LEFT ELBOW/OLECRANON FRACTURE;  Surgeon: Bradly BienenstockFred Ortmann, MD;  Location: [facility] OR;  Service: Orthopedics;  Laterality: Left;  . TONSILLECTOMY      Allergies Augmentin [amoxicillin-pot clavulanate] and Vicodin [hydrocodone-acetaminophen]  Social History Social History  Substance Use Topics  . Smoking status: Former Smoker    Years: 10.00  . Smokeless tobacco: Never Used     Comment: Quit 2006  . Alcohol use No    Review of Systems Constitutional: Negative for  fever. Cardiovascular: Negative for chest pain.Positive for palpitations Respiratory: Positive for shortness of breath Gastrointestinal: Negative for abdominal pain, vomiting and diarrhea. Positive for reflux Genitourinary: Negative for dysuria. Musculoskeletal: Negative for back pain. Positive for leg swelling Skin: Negative for rash. Neurological: Negative for headaches, focal weakness or numbness.  10-point ROS otherwise negative.  ____________________________________________   PHYSICAL EXAM:  VITAL SIGNS: ED Triage Vitals  Enc Vitals Group     BP 08/17/16 1540 (!) 135/122     Pulse Rate 08/17/16 1540 (!) 112     Resp 08/17/16 1540 17     Temp 08/17/16 1540 98.9 F (37.2 C)     Temp Source 08/17/16 1540 Oral     SpO2 08/17/16 1540 98 %     Weight 08/17/16 1540 240 lb (108.9 kg)     Height 08/17/16 1540 5\' 9"  (1.753 m)     Head Circumference --      Peak Flow --      Pain Score 08/17/16 1547 7     Pain Loc --      Pain Edu? --      Excl. in GC? --     Constitutional: Alert and oriented. Well appearing and in no distress. Eyes: Conjunctivae are normal. PERRL. Normal extraocular movements. ENT   Head: Normocephalic and atraumatic.   Nose: No congestion/rhinnorhea.   Mouth/Throat: Mucous membranes are moist.   Neck: No stridor. Cardiovascular: Normal rate, regular rhythm. No murmurs, rubs, or gallops. Respiratory: Normal respiratory effort without tachypnea nor retractions. Breath sounds are clear and  equal bilaterally. No wheezes/rales/rhonchi. Gastrointestinal: Soft and nontender. Normal bowel sounds Musculoskeletal: Nontender with normal range of motion in all extremities. No lower extremity tenderness nor edema. Neurologic:  Normal speech and language. No gross focal neurologic deficits are appreciated.  Skin:  Skin is warm, dry and intact. No rash noted. Psychiatric: Mood and affect are normal. Speech and behavior are normal.   ____________________________________________  EKG: Interpreted by me.Sinus tachycardia with rate 104 bpm, normal PR interval, normal QRS, normal QT interval. Flat T waves.  ____________________________________________  ED COURSE:  Pertinent labs & imaging results that were available during my care of the patient were reviewed by me and considered in my medical decision making (see chart for details). Clinical Course  Patient presents to the ER in no acute distress, we will assess with basic labs and imaging.  Procedures ____________________________________________   LABS (pertinent positives/negatives)  Labs Reviewed  BASIC METABOLIC PANEL - Abnormal; Notable for the following:       Result Value   Glucose, Bld 108 (*)    All other components within normal limits  FIBRIN DERIVATIVES D-DIMER ([facility] ONLY) - Abnormal; Notable for the following:    Fibrin derivatives D-dimer ([facility]) 552 (*)    All other components within normal limits  CBC  TROPONIN I    RADIOLOGY Images were viewed by me  Chest x-ray is unremarkable IMPRESSION: Normal chest radiograph. CT angiogram of the chest IMPRESSION: Negative for pulmonary embolus.  No acute abnormality.  Fatty infiltration of the liver.   ____________________________________________  FINAL ASSESSMENT AND PLAN  Palpitations, edema  Plan: Patient with labs and imaging as dictated above. Patient did get mild tachycardia with ambulation of uncertain etiology. I feel the next 10 for her will be Holter monitoring and/or echocardiogram performed by cardiology. I will refer her to her cardiologist for follow-up. I will advise no heavy physical activity until then. She is stable for discharged this time.   Emily Filbert, MD   Note: This dictation was prepared with Dragon dictation. Any transcriptional errors that result from this process are unintentional    Emily Filbert, MD 08/17/16 [id number]

## 2016-08-17 NOTE — ED Notes (Addendum)
Pt ambulated and HR went from 100 BPM at rest to 118 with activity. PT became light headed and reported the room was spinning. Pt became diaphoretic and red and a wheelchair was needed to get pt back to room safely. MD aware.

## 2016-08-19 DIAGNOSIS — I471 Supraventricular tachycardia: Secondary | ICD-10-CM | POA: Diagnosis not present

## 2016-08-19 DIAGNOSIS — R0602 Shortness of breath: Secondary | ICD-10-CM | POA: Diagnosis not present

## 2016-08-19 HISTORY — DX: Supraventricular tachycardia: I47.1

## 2016-08-20 DIAGNOSIS — Q248 Other specified congenital malformations of heart: Secondary | ICD-10-CM | POA: Diagnosis not present

## 2016-08-25 ENCOUNTER — Other Ambulatory Visit: Payer: Self-pay | Admitting: Internal Medicine

## 2016-08-25 DIAGNOSIS — R0602 Shortness of breath: Secondary | ICD-10-CM

## 2016-08-28 DIAGNOSIS — I471 Supraventricular tachycardia: Secondary | ICD-10-CM | POA: Diagnosis not present

## 2016-09-04 DIAGNOSIS — Z87891 Personal history of nicotine dependence: Secondary | ICD-10-CM | POA: Diagnosis not present

## 2016-09-04 DIAGNOSIS — R0602 Shortness of breath: Secondary | ICD-10-CM | POA: Diagnosis not present

## 2016-09-04 DIAGNOSIS — K760 Fatty (change of) liver, not elsewhere classified: Secondary | ICD-10-CM | POA: Diagnosis not present

## 2016-09-04 DIAGNOSIS — Q248 Other specified congenital malformations of heart: Secondary | ICD-10-CM | POA: Diagnosis not present

## 2016-09-04 DIAGNOSIS — R918 Other nonspecific abnormal finding of lung field: Secondary | ICD-10-CM | POA: Diagnosis not present

## 2016-09-04 DIAGNOSIS — R0789 Other chest pain: Secondary | ICD-10-CM | POA: Diagnosis not present

## 2016-09-04 DIAGNOSIS — R000 Tachycardia, unspecified: Secondary | ICD-10-CM | POA: Diagnosis not present

## 2016-09-04 DIAGNOSIS — R5383 Other fatigue: Secondary | ICD-10-CM | POA: Diagnosis not present

## 2016-09-04 DIAGNOSIS — R55 Syncope and collapse: Secondary | ICD-10-CM | POA: Diagnosis not present

## 2016-09-04 DIAGNOSIS — R002 Palpitations: Secondary | ICD-10-CM | POA: Diagnosis not present

## 2016-09-05 DIAGNOSIS — R002 Palpitations: Secondary | ICD-10-CM | POA: Diagnosis not present

## 2016-09-05 DIAGNOSIS — R918 Other nonspecific abnormal finding of lung field: Secondary | ICD-10-CM | POA: Diagnosis not present

## 2016-09-05 DIAGNOSIS — R000 Tachycardia, unspecified: Secondary | ICD-10-CM | POA: Diagnosis not present

## 2016-09-05 DIAGNOSIS — R0602 Shortness of breath: Secondary | ICD-10-CM | POA: Diagnosis not present

## 2016-09-05 DIAGNOSIS — Q248 Other specified congenital malformations of heart: Secondary | ICD-10-CM | POA: Diagnosis not present

## 2016-09-05 DIAGNOSIS — R5383 Other fatigue: Secondary | ICD-10-CM | POA: Diagnosis not present

## 2016-09-05 DIAGNOSIS — R55 Syncope and collapse: Secondary | ICD-10-CM | POA: Diagnosis not present

## 2016-09-05 DIAGNOSIS — K760 Fatty (change of) liver, not elsewhere classified: Secondary | ICD-10-CM | POA: Diagnosis not present

## 2016-09-05 DIAGNOSIS — Z87891 Personal history of nicotine dependence: Secondary | ICD-10-CM | POA: Diagnosis not present

## 2016-09-06 DIAGNOSIS — N393 Stress incontinence (female) (male): Secondary | ICD-10-CM | POA: Diagnosis not present

## 2016-09-06 DIAGNOSIS — F329 Major depressive disorder, single episode, unspecified: Secondary | ICD-10-CM | POA: Diagnosis not present

## 2016-09-06 DIAGNOSIS — R918 Other nonspecific abnormal finding of lung field: Secondary | ICD-10-CM | POA: Diagnosis not present

## 2016-09-06 DIAGNOSIS — R55 Syncope and collapse: Secondary | ICD-10-CM | POA: Diagnosis not present

## 2016-09-06 DIAGNOSIS — R0609 Other forms of dyspnea: Secondary | ICD-10-CM | POA: Diagnosis not present

## 2016-09-06 DIAGNOSIS — M19022 Primary osteoarthritis, left elbow: Secondary | ICD-10-CM | POA: Diagnosis not present

## 2016-09-06 DIAGNOSIS — R0602 Shortness of breath: Secondary | ICD-10-CM | POA: Diagnosis not present

## 2016-09-06 DIAGNOSIS — F419 Anxiety disorder, unspecified: Secondary | ICD-10-CM | POA: Diagnosis not present

## 2016-09-06 DIAGNOSIS — R7881 Bacteremia: Secondary | ICD-10-CM | POA: Diagnosis not present

## 2016-09-06 DIAGNOSIS — R262 Difficulty in walking, not elsewhere classified: Secondary | ICD-10-CM | POA: Diagnosis not present

## 2016-09-06 DIAGNOSIS — F418 Other specified anxiety disorders: Secondary | ICD-10-CM | POA: Diagnosis not present

## 2016-09-06 DIAGNOSIS — R05 Cough: Secondary | ICD-10-CM | POA: Diagnosis not present

## 2016-09-06 DIAGNOSIS — Z87891 Personal history of nicotine dependence: Secondary | ICD-10-CM | POA: Diagnosis not present

## 2016-09-06 DIAGNOSIS — M170 Bilateral primary osteoarthritis of knee: Secondary | ICD-10-CM | POA: Diagnosis not present

## 2016-09-06 DIAGNOSIS — Z880 Allergy status to penicillin: Secondary | ICD-10-CM | POA: Diagnosis not present

## 2016-09-06 DIAGNOSIS — B957 Other staphylococcus as the cause of diseases classified elsewhere: Secondary | ICD-10-CM | POA: Diagnosis not present

## 2016-09-06 DIAGNOSIS — A481 Legionnaires' disease: Secondary | ICD-10-CM | POA: Diagnosis not present

## 2016-09-06 DIAGNOSIS — R509 Fever, unspecified: Secondary | ICD-10-CM | POA: Diagnosis not present

## 2016-09-09 ENCOUNTER — Ambulatory Visit: Payer: 59 | Attending: Internal Medicine

## 2016-09-11 DIAGNOSIS — J181 Lobar pneumonia, unspecified organism: Secondary | ICD-10-CM | POA: Diagnosis not present

## 2016-09-16 DIAGNOSIS — J181 Lobar pneumonia, unspecified organism: Secondary | ICD-10-CM | POA: Diagnosis not present

## 2016-09-16 DIAGNOSIS — J189 Pneumonia, unspecified organism: Secondary | ICD-10-CM | POA: Diagnosis not present

## 2016-10-30 DIAGNOSIS — J40 Bronchitis, not specified as acute or chronic: Secondary | ICD-10-CM | POA: Diagnosis not present

## 2016-10-30 DIAGNOSIS — J4522 Mild intermittent asthma with status asthmaticus: Secondary | ICD-10-CM | POA: Diagnosis not present

## 2016-12-23 DIAGNOSIS — I471 Supraventricular tachycardia: Secondary | ICD-10-CM | POA: Diagnosis not present

## 2016-12-23 DIAGNOSIS — G541 Lumbosacral plexus disorders: Secondary | ICD-10-CM | POA: Insufficient documentation

## 2017-01-29 ENCOUNTER — Emergency Department: Payer: 59

## 2017-01-29 ENCOUNTER — Emergency Department
Admission: EM | Admit: 2017-01-29 | Discharge: 2017-01-29 | Disposition: A | Discharge status: Home / Self Care | Payer: 59 | Attending: Emergency Medicine | Admitting: Emergency Medicine

## 2017-01-29 ENCOUNTER — Encounter: Payer: Self-pay | Admitting: *Deleted

## 2017-01-29 DIAGNOSIS — Z79899 Other long term (current) drug therapy: Secondary | ICD-10-CM | POA: Diagnosis not present

## 2017-01-29 DIAGNOSIS — W07XXXA Fall from chair, initial encounter: Secondary | ICD-10-CM | POA: Diagnosis not present

## 2017-01-29 DIAGNOSIS — S060X0A Concussion without loss of consciousness, initial encounter: Secondary | ICD-10-CM | POA: Diagnosis not present

## 2017-01-29 DIAGNOSIS — M5010 Cervical disc disorder with radiculopathy, unspecified cervical region: Secondary | ICD-10-CM | POA: Diagnosis not present

## 2017-01-29 DIAGNOSIS — R51 Headache: Secondary | ICD-10-CM | POA: Diagnosis not present

## 2017-01-29 DIAGNOSIS — Z87891 Personal history of nicotine dependence: Secondary | ICD-10-CM | POA: Insufficient documentation

## 2017-01-29 DIAGNOSIS — G44309 Post-traumatic headache, unspecified, not intractable: Secondary | ICD-10-CM | POA: Diagnosis not present

## 2017-01-29 DIAGNOSIS — Y999 Unspecified external cause status: Secondary | ICD-10-CM | POA: Insufficient documentation

## 2017-01-29 DIAGNOSIS — S0990XA Unspecified injury of head, initial encounter: Secondary | ICD-10-CM

## 2017-01-29 DIAGNOSIS — Y939 Activity, unspecified: Secondary | ICD-10-CM | POA: Diagnosis not present

## 2017-01-29 DIAGNOSIS — Y929 Unspecified place or not applicable: Secondary | ICD-10-CM | POA: Diagnosis not present

## 2017-01-29 MED ORDER — PROCHLORPERAZINE MALEATE 10 MG PO TABS: 10.0000 mg | ORAL_TABLET | Freq: Four times a day (QID) | ORAL | 1 refills | Status: DC | PRN

## 2017-01-29 MED ORDER — SODIUM CHLORIDE 0.9 % IV BOLUS (SEPSIS)
1000.0000 mL | Freq: Once | INTRAVENOUS | Status: AC
  Administered 2017-01-29: 1000 mL via INTRAVENOUS

## 2017-01-29 MED ORDER — PROCHLORPERAZINE EDISYLATE 5 MG/ML IJ SOLN
10.0000 mg | Freq: Once | INTRAMUSCULAR | Status: AC
  Administered 2017-01-29: 10 mg via INTRAVENOUS
  Filled 2017-01-29: qty 2

## 2017-01-29 MED ORDER — SODIUM CHLORIDE 0.9 % IV BOLUS (SEPSIS): 1000.0000 mL | Freq: Once | INTRAVENOUS | Status: DC

## 2017-01-29 MED ORDER — PROCHLORPERAZINE EDISYLATE 5 MG/ML IJ SOLN: 10.0000 mg | Freq: Once | INTRAMUSCULAR | Status: DC

## 2017-01-29 MED ORDER — DIPHENHYDRAMINE HCL 50 MG/ML IJ SOLN
12.5000 mg | Freq: Once | INTRAMUSCULAR | Status: AC
  Administered 2017-01-29: 12.5 mg via INTRAVENOUS
  Filled 2017-01-29: qty 1

## 2017-01-29 MED ORDER — KETOROLAC TROMETHAMINE 30 MG/ML IJ SOLN
15.0000 mg | Freq: Once | INTRAMUSCULAR | Status: AC
  Administered 2017-01-29: 15 mg via INTRAVENOUS
  Filled 2017-01-29: qty 1

## 2017-01-29 MED ORDER — KETOROLAC TROMETHAMINE 30 MG/ML IJ SOLN: 15.0000 mg | Freq: Once | INTRAMUSCULAR | Status: DC

## 2017-01-29 NOTE — ED Notes (Signed)
Called x1. No answer.

## 2017-01-29 NOTE — ED Provider Notes (Addendum)
[facility] Emergency Department Provider Note  ____________________________________________   I have reviewed the triage vital signs and the nursing notes.   HISTORY  Chief Complaint Fall and Headache    HPI Brenda Sellers is a 36 y.o. female who states she has a history of recurrent headaches to her life sometimes with photophobia but has never been definitively diagnosed with a migraine. 2 days ago, she was leaning back in a recliner chair, and it broke and she fell back hitting her head.he may have passed out for "a second or 2", since that time though she has been having nausea and a photophobic headache. She has not been vomiting. This headache feels similar to prior headaches but more significant. Happened when she hit her head. No fever no stiff neck no other injury noted.     Past Medical History:  Diagnosis Date  . Anxiety   . Complication of anesthesia   . Nerve pain   . Pericardial cyst   . PONV (postoperative nausea and vomiting)    Last surgery 06/2014- nausea not vomitting was pre medicated.    Patient Active Problem List   Diagnosis Date Noted  . Left radial head fracture 04/04/2015    Past Surgical History:  Procedure Laterality Date  . ABDOMINAL HYSTERECTOMY    . ANTERIOR CRUCIATE LIGAMENT REPAIR Left 1998   Menicus, PCL Screw  . CHOLECYSTECTOMY    . KNEE ARTHROSCOPY Right    Menicus  . LAPAROSCOPIC TOTAL HYSTERECTOMY  07/04/14  . ORIF ELBOW FRACTURE Left 04/04/2015   Procedure: OPEN REDUCTION INTERNAL FIXATION (ORIF) LEFT ELBOW/OLECRANON FRACTURE;  Surgeon: Bradly Bienenstock, MD;  Location: [facility] OR;  Service: Orthopedics;  Laterality: Left;  . TONSILLECTOMY      Prior to Admission medications   Medication Sig Start Date End Date Taking? Authorizing Provider  albuterol (PROVENTIL HFA;VENTOLIN HFA) 108 (90 Base) MCG/ACT inhaler Inhale 2 puffs into the lungs every 6 (six) hours as needed for wheezing or shortness of breath. 01/03/16    Faythe Ghee, PA-C  ALPRAZolam Prudy Feeler) 0.25 MG tablet Take 0.25 mg by mouth every 4 (four) hours as needed for anxiety.    Historical Provider, MD  buPROPion (WELLBUTRIN) 100 MG tablet Take 100 mg by mouth 2 (two) times daily.    Historical Provider, MD  cefdinir (OMNICEF) 300 MG capsule Take 1 capsule (300 mg total) by mouth 2 (two) times daily. 01/02/16   Faythe Ghee, PA-C  diazepam (VALIUM) 5 MG tablet Take 5 mg by mouth at bedtime.    Historical Provider, MD  dicyclomine (BENTYL) 20 MG tablet Take 1 tablet (20 mg total) by mouth 3 (three) times daily as needed (abdominal pain). 02/01/16   Phineas Semen, MD  escitalopram (LEXAPRO) 10 MG tablet Take 10 mg by mouth daily.    Historical Provider, MD  fluconazole (DIFLUCAN) 150 MG tablet Take one now and one in a week 01/02/16   Faythe Ghee, PA-C  methocarbamol (ROBAXIN) 500 MG tablet Take 2 tablets (1,000 mg total) by mouth 4 (four) times daily as needed (Pain). 01/11/16   Nicole Pisciotta, PA-C  metoprolol tartrate (LOPRESSOR) 25 MG tablet Take 1 tablet (25 mg total) by mouth 2 (two) times daily. 08/17/16 08/17/17  Emily Filbert, MD  miconazole (MICOTIN) 2 % powder Apply 1 application topically as needed for itching (rash). Reported on 01/02/2016    Historical Provider, MD  ondansetron (ZOFRAN) 4 MG tablet Take 1 tablet (4 mg total) by mouth every  8 (eight) hours as needed for nausea or vomiting. 02/01/16   Phineas Semen, MD  oxyCODONE (ROXICODONE) 5 MG immediate release tablet Take 1 tablet (5 mg total) by mouth every 6 (six) hours as needed for severe pain. 02/01/16 01/31/17  Phineas Semen, MD  oxyCODONE-acetaminophen (PERCOCET/ROXICET) 5-325 MG tablet 1 to 2 tabs PO q6hrs  PRN for pain 01/11/16   Joni Reining Pisciotta, PA-C  predniSONE (DELTASONE) 20 MG tablet Take 2 tablets (40 mg total) by mouth daily. 01/11/16   Nicole Pisciotta, PA-C  sertraline (ZOLOFT) 100 MG tablet Take 100 mg by mouth daily. Reported on 01/02/2016    Historical Provider, MD   vitamin C (ASCORBIC ACID) 500 MG tablet Take 1 tablet (500 mg total) by mouth daily. 04/04/15   Bradly Bienenstock, MD    Allergies Augmentin [amoxicillin-pot clavulanate] and Vicodin [hydrocodone-acetaminophen]  No family history on file.  Social History Social History  Substance Use Topics  . Smoking status: Former Smoker    Years: 10.00  . Smokeless tobacco: Never Used     Comment: Quit 2006  . Alcohol use No    Review of Systems Constitutional: No fever/chills Eyes: No visual changes. ENT: No sore throat. No stiff neck no neck pain Cardiovascular: Denies chest pain. Respiratory: Denies shortness of breath. Gastrointestinal:   no vomiting.  No diarrhea.  No constipation. Genitourinary: Negative for dysuria. Musculoskeletal: Negative lower extremity swelling Skin: Negative for rash. Neurological: Negative for severe headaches, focal weakness or numbness. 10-point ROS otherwise negative.  ____________________________________________   PHYSICAL EXAM:  VITAL SIGNS: ED Triage Vitals  Enc Vitals Group     BP 01/29/17 1610 127/77     Pulse Rate 01/29/17 1610 87     Resp 01/29/17 1610 18     Temp --      Temp src --      SpO2 01/29/17 1610 98 %     Weight 01/29/17 1547 220 lb (99.8 kg)     Height 01/29/17 1547 5\' 9"  (1.753 m)     Head Circumference --      Peak Flow --      Pain Score 01/29/17 1548 9     Pain Loc --      Pain Edu? --      Excl. in GC? --     Constitutional: Alert and oriented. Well appearing and in no acute distress. Eyes: Conjunctivae are normal. PERRL. EOMI. Head: Atraumatic. Nose: No congestion/rhinnorhea. Mouth/Throat: Mucous membranes are moist.  Oropharynx non-erythematous. Neck: No stridor.   Nontender with no meningismus Cardiovascular: Normal rate, regular rhythm. Grossly normal heart sounds.  Good peripheral circulation. Respiratory: Normal respiratory effort.  No retractions. Lungs CTAB. Abdominal: Soft and nontender. No distention. No  guarding no rebound Back:  There is no focal tenderness or step off.  there is no midline tenderness there are no lesions noted. there is no CVA tenderness Musculoskeletal: No lower extremity tenderness, no upper extremity tenderness. No joint effusions, no DVT signs strong distal pulses no edema Neurologic:  Cranial nerves II through XII are grossly intact 5 out of 5 strength bilateral upper and lower extremity. Finger to nose within normal limits heel to shin within normal limits, speech is normal with no word finding difficulty or dysarthria, reflexes symmetric, pupils are equally round and reactive to light, there is no pronator drift, sensation is normal, vision is intact to confrontation, gait is deferred, there is no nystagmus, normal neurologic exam Skin:  Skin is warm, dry and intact. No rash  noted. Psychiatric: Mood and affect are normal. Speech and behavior are normal.  ____________________________________________   LABS (all labs ordered are listed, but only abnormal results are displayed)  Labs Reviewed - No data to display ____________________________________________  EKG  I personally interpreted any EKGs ordered by me or triage  ____________________________________________  RADIOLOGY  I reviewed any imaging ordered by me or triage that were performed during my shift and, if possible, patient and/or family made aware of any abnormal findings. ____________________________________________   PROCEDURES  Procedure(s) performed: None  Procedures  Critical Care performed: None  ____________________________________________   INITIAL IMPRESSION / ASSESSMENT AND PLAN / ED COURSE  Pertinent labs & imaging results that were available during my care of the patient were reviewed by me and considered in my medical decision making (see chart for details).  Patient with history of likely migraines although not yet diagnosed with postconcussive syndrome symptoms I did obtain  CT scan which was reassuringly negative. Normal neuro exam.  Hx/ph c/w post concussive syndrome sx.  We are giving her medicine for her headache and we will reassess. I do not feel that she has meningitis or bleed or cavernous thrombosis etc.   ----------------------------------------- 7:33 PM on 01/29/2017 ----------------------------------------- Patient states that her headache "broke" and she is not having symptoms at this time she feels much better. She would like to go home. Remains neurologic intact. Extensive return precautions given and understood. Concussion precautions given and understood.    ____________________________________________   FINAL CLINICAL IMPRESSION(S) / ED DIAGNOSES  Final diagnoses:  None      This chart was dictated using voice recognition software.  Despite best efforts to proofread,  errors can occur which can change meaning.      Jeanmarie PlantJames A Idil Maslanka, MD 01/29/17 1911    Jeanmarie PlantJames A Sarrinah Gardin, MD 01/29/17 [phone]

## 2017-01-29 NOTE — ED Triage Notes (Signed)
Pt fell hit head, pt was sitting on a recliner it broke, pt reports LOC for 45 minutes on Tuesday, pt complains of a severe headache

## 2017-03-05 ENCOUNTER — Ambulatory Visit
Admission: RE | Admit: 2017-03-05 | Discharge: 2017-03-05 | Disposition: A | Discharge status: Home / Self Care | Payer: PRIVATE HEALTH INSURANCE | Source: Ambulatory Visit | Attending: Family | Admitting: Family

## 2017-03-05 ENCOUNTER — Other Ambulatory Visit: Payer: Self-pay | Admitting: Family

## 2017-03-05 DIAGNOSIS — W501XXA Accidental kick by another person, initial encounter: Secondary | ICD-10-CM | POA: Insufficient documentation

## 2017-03-05 DIAGNOSIS — M1711 Unilateral primary osteoarthritis, right knee: Secondary | ICD-10-CM | POA: Diagnosis not present

## 2017-03-05 DIAGNOSIS — R52 Pain, unspecified: Secondary | ICD-10-CM

## 2017-03-05 DIAGNOSIS — M25561 Pain in right knee: Secondary | ICD-10-CM | POA: Diagnosis present

## 2017-03-05 DIAGNOSIS — R609 Edema, unspecified: Secondary | ICD-10-CM

## 2017-03-12 ENCOUNTER — Ambulatory Visit
Admission: RE | Admit: 2017-03-12 | Discharge: 2017-03-12 | Disposition: A | Discharge status: Home / Self Care | Payer: 59 | Source: Ambulatory Visit | Attending: Family | Admitting: Family

## 2017-03-12 ENCOUNTER — Other Ambulatory Visit: Payer: Self-pay | Admitting: Family

## 2017-03-12 DIAGNOSIS — R209 Unspecified disturbances of skin sensation: Secondary | ICD-10-CM | POA: Diagnosis not present

## 2017-03-12 DIAGNOSIS — S8991XA Unspecified injury of right lower leg, initial encounter: Secondary | ICD-10-CM | POA: Diagnosis not present

## 2017-03-25 ENCOUNTER — Other Ambulatory Visit: Payer: Self-pay | Admitting: Orthopedic Surgery

## 2017-03-25 DIAGNOSIS — S8391XA Sprain of unspecified site of right knee, initial encounter: Secondary | ICD-10-CM

## 2017-04-04 ENCOUNTER — Ambulatory Visit
Admission: RE | Admit: 2017-04-04 | Discharge: 2017-04-04 | Disposition: A | Discharge status: Home / Self Care | Payer: PRIVATE HEALTH INSURANCE | Source: Ambulatory Visit | Attending: Orthopedic Surgery | Admitting: Orthopedic Surgery

## 2017-04-04 DIAGNOSIS — S83249A Other tear of medial meniscus, current injury, unspecified knee, initial encounter: Secondary | ICD-10-CM | POA: Diagnosis present

## 2017-04-04 DIAGNOSIS — S83281A Other tear of lateral meniscus, current injury, right knee, initial encounter: Secondary | ICD-10-CM | POA: Diagnosis not present

## 2017-04-04 DIAGNOSIS — M7121 Synovial cyst of popliteal space [Baker], right knee: Secondary | ICD-10-CM | POA: Diagnosis not present

## 2017-04-04 DIAGNOSIS — W500XXA Accidental hit or strike by another person, initial encounter: Secondary | ICD-10-CM | POA: Diagnosis not present

## 2017-04-04 DIAGNOSIS — S8391XA Sprain of unspecified site of right knee, initial encounter: Secondary | ICD-10-CM

## 2017-04-08 ENCOUNTER — Encounter (HOSPITAL_BASED_OUTPATIENT_CLINIC_OR_DEPARTMENT_OTHER): Payer: Self-pay | Admitting: *Deleted

## 2017-04-09 NOTE — H&P (Signed)
Brenda Sellers is an 36 y.o. female.   Chief Complaint: Right knee pain  HPI: Ms.  Brenda Sellers is a 36 year old female that is following up for right knee pain after an injury that occurred at work.  This was about 1-1/2 months ago.  She has been undergoing light duty at her work.  She is unable to drive.  She has been having to work 12 hour shifts which she is unable to complete.  She has been taken Mobic for the pain.  She is been using crutches as well as a hinged knee brace to help with ambulation.  She also has some feelings of giving way from time to time.  She has not completed any injection therapy and no prior physical therapy.  An MRI was completed and it shows a mid body and anterior horn tears of the lateral meniscus with tricompartmental degenerative changes most significant lateral compartment.  Past Medical History:  Diagnosis Date  . Anxiety   . Complication of anesthesia   . Nerve pain   . Pericardial cyst   . PONV (postoperative nausea and vomiting)    Last surgery 06/2014- nausea not vomitting was pre medicated.    Past Surgical History:  Procedure Laterality Date  . ABDOMINAL HYSTERECTOMY    . ANTERIOR CRUCIATE LIGAMENT REPAIR Left 1998   Menicus, PCL Screw  . CHOLECYSTECTOMY    . KNEE ARTHROSCOPY Right    Menicus  . LAPAROSCOPIC TOTAL HYSTERECTOMY  07/04/14  . ORIF ELBOW FRACTURE Left 04/04/2015   Procedure: OPEN REDUCTION INTERNAL FIXATION (ORIF) LEFT ELBOW/OLECRANON FRACTURE;  Surgeon: Bradly BienenstockFred Ortmann, MD;  Location: [facility] OR;  Service: Orthopedics;  Laterality: Left;  . TONSILLECTOMY      History reviewed. No pertinent family history. Social History:  reports that she has quit smoking. She quit after 10.00 years of use. She has never used smokeless tobacco. She reports that she does not drink alcohol or use drugs.  Allergies:  Allergies  Allergen Reactions  . Augmentin [Amoxicillin-Pot Clavulanate] Rash    Can take any other PCNs  . Vicodin [Hydrocodone-Acetaminophen]  Nausea And Vomiting, Rash and Nausea Only    No prescriptions prior to admission.    No results found for this or any previous visit (from the past 48 hour(s)). No results found.  Review of Systems  Constitutional: Negative.   HENT: Positive for tinnitus.        Sinus problems  Eyes: Negative.   Respiratory: Negative.   Cardiovascular: Negative.   Gastrointestinal: Negative.   Genitourinary: Negative.   Musculoskeletal: Positive for joint pain.  Skin: Negative.   Neurological: Positive for headaches.  Endo/Heme/Allergies: Negative.   Psychiatric/Behavioral: Positive for depression.    Height 5\' 9"  (1.753 m), weight 99.8 kg (220 lb). Physical Exam  Constitutional: She is oriented to person, place, and time. She appears well-developed and well-nourished.  HENT:  Head: Normocephalic and atraumatic.  Eyes: Pupils are equal, round, and reactive to light.  Neck: Normal range of motion. Neck supple.  Cardiovascular: Intact distal pulses.   Respiratory: Effort normal.  Musculoskeletal: She exhibits tenderness.  No obvious effusion.  Some tenderness palpation of the medial joint line.  Normal extension.  Flexion limited to around 100.  Normal endpoints with valgus varus stress testing.  Neurovascular intact.  Neurological: She is alert and oriented to person, place, and time.  Skin: Skin is warm and dry.  Psychiatric: She has a normal mood and affect. Her behavior is normal. Judgment and thought content normal.  Assessment/Plan Assessment: Right knee pain likely related to mid body and anterior horn tears of the lateral meniscus  Plan: She was provided a work note with restrictions of a maximum of 8 hours of work as well as being able to prop her leg up.  She was continued with other restrictions from her previous work note.  We will planning on proceeding with arthroscopic surgery.  She will perform physical therapy after her first postop follow-up. Derec Mozingo R,  PA-C 04/09/2017, 9:41 AM

## 2017-04-10 ENCOUNTER — Ambulatory Visit (HOSPITAL_BASED_OUTPATIENT_CLINIC_OR_DEPARTMENT_OTHER): Payer: PRIVATE HEALTH INSURANCE | Admitting: Anesthesiology

## 2017-04-10 ENCOUNTER — Encounter (HOSPITAL_BASED_OUTPATIENT_CLINIC_OR_DEPARTMENT_OTHER)
Admission: RE | Disposition: A | Discharge status: Home / Self Care | Payer: Self-pay | Source: Ambulatory Visit | Attending: Orthopedic Surgery

## 2017-04-10 ENCOUNTER — Encounter (HOSPITAL_BASED_OUTPATIENT_CLINIC_OR_DEPARTMENT_OTHER): Payer: Self-pay | Admitting: Anesthesiology

## 2017-04-10 ENCOUNTER — Ambulatory Visit (HOSPITAL_BASED_OUTPATIENT_CLINIC_OR_DEPARTMENT_OTHER)
Admission: RE | Admit: 2017-04-10 | Discharge: 2017-04-10 | Disposition: A | Discharge status: Home / Self Care | Payer: PRIVATE HEALTH INSURANCE | Source: Ambulatory Visit | Attending: Orthopedic Surgery | Admitting: Orthopedic Surgery

## 2017-04-10 DIAGNOSIS — S83281A Other tear of lateral meniscus, current injury, right knee, initial encounter: Secondary | ICD-10-CM | POA: Insufficient documentation

## 2017-04-10 DIAGNOSIS — F419 Anxiety disorder, unspecified: Secondary | ICD-10-CM | POA: Insufficient documentation

## 2017-04-10 DIAGNOSIS — X58XXXA Exposure to other specified factors, initial encounter: Secondary | ICD-10-CM | POA: Insufficient documentation

## 2017-04-10 DIAGNOSIS — Z885 Allergy status to narcotic agent status: Secondary | ICD-10-CM | POA: Insufficient documentation

## 2017-04-10 DIAGNOSIS — M2241 Chondromalacia patellae, right knee: Secondary | ICD-10-CM | POA: Diagnosis not present

## 2017-04-10 DIAGNOSIS — Z87891 Personal history of nicotine dependence: Secondary | ICD-10-CM | POA: Diagnosis not present

## 2017-04-10 DIAGNOSIS — Z880 Allergy status to penicillin: Secondary | ICD-10-CM | POA: Insufficient documentation

## 2017-04-10 DIAGNOSIS — Z90710 Acquired absence of both cervix and uterus: Secondary | ICD-10-CM | POA: Diagnosis not present

## 2017-04-10 HISTORY — PX: CHONDROPLASTY: SHX5177

## 2017-04-10 HISTORY — PX: KNEE ARTHROSCOPY WITH LATERAL MENISECTOMY: SHX6193

## 2017-04-10 HISTORY — PX: KNEE ARTHROSCOPY: SHX127

## 2017-04-10 SURGERY — ARTHROSCOPY, KNEE
Anesthesia: General | Site: Knee | Laterality: Right

## 2017-04-10 MED ORDER — ONDANSETRON HCL 4 MG/2ML IJ SOLN
INTRAMUSCULAR | Status: DC | PRN
  Administered 2017-04-10 (×2): 4 mg via INTRAVENOUS

## 2017-04-10 MED ORDER — OXYCODONE HCL 5 MG PO TABS
5.0000 mg | ORAL_TABLET | Freq: Once | ORAL | Status: AC | PRN
  Administered 2017-04-10: 5 mg via ORAL

## 2017-04-10 MED ORDER — LACTATED RINGERS IV SOLN
INTRAVENOUS | Status: DC
  Administered 2017-04-10 (×2): via INTRAVENOUS

## 2017-04-10 MED ORDER — DEXAMETHASONE SODIUM PHOSPHATE 4 MG/ML IJ SOLN
INTRAMUSCULAR | Status: DC | PRN
  Administered 2017-04-10: 10 mg via INTRAVENOUS

## 2017-04-10 MED ORDER — FENTANYL CITRATE (PF) 100 MCG/2ML IJ SOLN
50.0000 ug | INTRAMUSCULAR | Status: AC | PRN
  Administered 2017-04-10: 100 ug via INTRAVENOUS
  Administered 2017-04-10 (×2): 25 ug via INTRAVENOUS

## 2017-04-10 MED ORDER — FENTANYL CITRATE (PF) 100 MCG/2ML IJ SOLN
INTRAMUSCULAR | Status: AC
  Filled 2017-04-10: qty 2

## 2017-04-10 MED ORDER — PROMETHAZINE HCL 25 MG/ML IJ SOLN
6.2500 mg | INTRAMUSCULAR | Status: DC | PRN
  Administered 2017-04-10: 12.5 mg via INTRAVENOUS

## 2017-04-10 MED ORDER — METOCLOPRAMIDE HCL 5 MG/ML IJ SOLN
10.0000 mg | Freq: Once | INTRAMUSCULAR | Status: AC
  Administered 2017-04-10: 10 mg via INTRAVENOUS

## 2017-04-10 MED ORDER — OXYCODONE-ACETAMINOPHEN 5-325 MG PO TABS: 1.0000 | ORAL_TABLET | Freq: Four times a day (QID) | ORAL | 0 refills | Status: DC | PRN

## 2017-04-10 MED ORDER — LIDOCAINE 2% (20 MG/ML) 5 ML SYRINGE
INTRAMUSCULAR | Status: DC | PRN
  Administered 2017-04-10: 60 mg via INTRAVENOUS

## 2017-04-10 MED ORDER — MIDAZOLAM HCL 2 MG/2ML IJ SOLN
1.0000 mg | INTRAMUSCULAR | Status: DC | PRN
  Administered 2017-04-10: 2 mg via INTRAVENOUS

## 2017-04-10 MED ORDER — EPINEPHRINE 30 MG/30ML IJ SOLN
INTRAMUSCULAR | Status: AC
  Filled 2017-04-10: qty 1

## 2017-04-10 MED ORDER — BUPIVACAINE HCL (PF) 0.5 % IJ SOLN
INTRAMUSCULAR | Status: DC | PRN
  Administered 2017-04-10: 20 mL

## 2017-04-10 MED ORDER — MIDAZOLAM HCL 2 MG/2ML IJ SOLN
INTRAMUSCULAR | Status: AC
  Filled 2017-04-10: qty 2

## 2017-04-10 MED ORDER — SCOPOLAMINE 1 MG/3DAYS TD PT72
MEDICATED_PATCH | TRANSDERMAL | Status: AC
  Filled 2017-04-10: qty 1

## 2017-04-10 MED ORDER — FENTANYL CITRATE (PF) 100 MCG/2ML IJ SOLN
25.0000 ug | INTRAMUSCULAR | Status: DC | PRN
  Administered 2017-04-10 (×2): 50 ug via INTRAVENOUS

## 2017-04-10 MED ORDER — PROMETHAZINE HCL 25 MG/ML IJ SOLN
INTRAMUSCULAR | Status: AC
  Filled 2017-04-10: qty 1

## 2017-04-10 MED ORDER — MEPERIDINE HCL 25 MG/ML IJ SOLN: 6.2500 mg | INTRAMUSCULAR | Status: DC | PRN

## 2017-04-10 MED ORDER — VANCOMYCIN HCL IN DEXTROSE 1-5 GM/200ML-% IV SOLN
INTRAVENOUS | Status: AC
  Filled 2017-04-10: qty 200

## 2017-04-10 MED ORDER — DEXAMETHASONE SODIUM PHOSPHATE 10 MG/ML IJ SOLN
INTRAMUSCULAR | Status: AC
  Filled 2017-04-10: qty 1

## 2017-04-10 MED ORDER — SCOPOLAMINE 1 MG/3DAYS TD PT72
1.0000 | MEDICATED_PATCH | Freq: Once | TRANSDERMAL | Status: DC | PRN
  Administered 2017-04-10: 1.5 mg via TRANSDERMAL

## 2017-04-10 MED ORDER — OXYCODONE HCL 5 MG PO TABS
ORAL_TABLET | ORAL | Status: AC
  Filled 2017-04-10: qty 1

## 2017-04-10 MED ORDER — VANCOMYCIN HCL 10 G IV SOLR
1500.0000 mg | INTRAVENOUS | Status: AC
  Administered 2017-04-10: 1500 mg via INTRAVENOUS

## 2017-04-10 MED ORDER — BUPIVACAINE HCL (PF) 0.5 % IJ SOLN
INTRAMUSCULAR | Status: AC
  Filled 2017-04-10: qty 30

## 2017-04-10 MED ORDER — VANCOMYCIN HCL IN DEXTROSE 1-5 GM/200ML-% IV SOLN: 1000.0000 mg | INTRAVENOUS | Status: DC

## 2017-04-10 MED ORDER — LACTATED RINGERS IV SOLN: INTRAVENOUS | Status: DC

## 2017-04-10 MED ORDER — ONDANSETRON HCL 4 MG/2ML IJ SOLN
INTRAMUSCULAR | Status: AC
Start: 2017-04-10 — End: 2017-04-10
  Filled 2017-04-10: qty 2

## 2017-04-10 MED ORDER — METOCLOPRAMIDE HCL 5 MG/ML IJ SOLN
INTRAMUSCULAR | Status: AC
  Filled 2017-04-10: qty 2

## 2017-04-10 MED ORDER — VANCOMYCIN HCL IN DEXTROSE 500-5 MG/100ML-% IV SOLN
INTRAVENOUS | Status: AC
  Filled 2017-04-10: qty 100

## 2017-04-10 MED ORDER — CHLORHEXIDINE GLUCONATE 4 % EX LIQD: 60.0000 mL | Freq: Once | CUTANEOUS | Status: DC

## 2017-04-10 MED ORDER — OXYCODONE HCL 5 MG/5ML PO SOLN: 5.0000 mg | Freq: Once | ORAL | Status: AC | PRN

## 2017-04-10 MED ORDER — PROPOFOL 10 MG/ML IV BOLUS
INTRAVENOUS | Status: DC | PRN
  Administered 2017-04-10: 200 mg via INTRAVENOUS

## 2017-04-10 MED ORDER — LIDOCAINE 2% (20 MG/ML) 5 ML SYRINGE
INTRAMUSCULAR | Status: AC
  Filled 2017-04-10: qty 5

## 2017-04-10 SURGICAL SUPPLY — 41 items
BANDAGE ACE 6X5 VEL STRL LF (GAUZE/BANDAGES/DRESSINGS) ×2 IMPLANT
BLADE 4.2CUDA (BLADE) IMPLANT
BLADE CUTTER GATOR 3.5 (BLADE) ×2 IMPLANT
BLADE GREAT WHITE 4.2 (BLADE) ×2 IMPLANT
BNDG COHESIVE 6X5 TAN STRL LF (GAUZE/BANDAGES/DRESSINGS) ×2 IMPLANT
DRAPE ARTHROSCOPY W/POUCH 114 (DRAPES) ×2 IMPLANT
DURAPREP 26ML APPLICATOR (WOUND CARE) ×2 IMPLANT
ELECT MENISCUS 165MM 90D (ELECTRODE) IMPLANT
ELECT REM PT RETURN 9FT ADLT (ELECTROSURGICAL)
ELECTRODE REM PT RTRN 9FT ADLT (ELECTROSURGICAL) IMPLANT
GAUZE SPONGE 4X4 12PLY STRL (GAUZE/BANDAGES/DRESSINGS) ×2 IMPLANT
GAUZE XEROFORM 1X8 LF (GAUZE/BANDAGES/DRESSINGS) ×2 IMPLANT
GLOVE BIO SURGEON STRL SZ 6.5 (GLOVE) ×2 IMPLANT
GLOVE BIO SURGEON STRL SZ7.5 (GLOVE) ×2 IMPLANT
GLOVE BIO SURGEON STRL SZ8.5 (GLOVE) ×2 IMPLANT
GLOVE BIOGEL PI IND STRL 7.0 (GLOVE) ×2 IMPLANT
GLOVE BIOGEL PI IND STRL 8 (GLOVE) ×1 IMPLANT
GLOVE BIOGEL PI IND STRL 9 (GLOVE) ×1 IMPLANT
GLOVE BIOGEL PI INDICATOR 7.0 (GLOVE) ×2
GLOVE BIOGEL PI INDICATOR 8 (GLOVE) ×1
GLOVE BIOGEL PI INDICATOR 9 (GLOVE) ×1
GOWN STRL REUS W/ TWL LRG LVL3 (GOWN DISPOSABLE) ×2 IMPLANT
GOWN STRL REUS W/TWL LRG LVL3 (GOWN DISPOSABLE) ×2
GOWN STRL REUS W/TWL XL LVL3 (GOWN DISPOSABLE) ×2 IMPLANT
IV NS IRRIG 3000ML ARTHROMATIC (IV SOLUTION) ×2 IMPLANT
KNEE WRAP E Z 3 GEL PACK (MISCELLANEOUS) ×2 IMPLANT
MANIFOLD NEPTUNE II (INSTRUMENTS) IMPLANT
NDL SAFETY ECLIPSE 18X1.5 (NEEDLE) ×1 IMPLANT
NEEDLE HYPO 18GX1.5 SHARP (NEEDLE) ×1
PACK ARTHROSCOPY DSU (CUSTOM PROCEDURE TRAY) ×2 IMPLANT
PACK BASIN DAY SURGERY FS (CUSTOM PROCEDURE TRAY) ×2 IMPLANT
PAD ALCOHOL SWAB (MISCELLANEOUS) ×2 IMPLANT
PENCIL BUTTON HOLSTER BLD 10FT (ELECTRODE) IMPLANT
PROBE BIPOLAR ATHRO 135MM 90D (MISCELLANEOUS) ×2 IMPLANT
SET ARTHROSCOPY TUBING (MISCELLANEOUS) ×1
SET ARTHROSCOPY TUBING LN (MISCELLANEOUS) ×1 IMPLANT
SLEEVE SCD COMPRESS KNEE MED (MISCELLANEOUS) IMPLANT
SYR 3ML 18GX1 1/2 (SYRINGE) IMPLANT
SYR 5ML LL (SYRINGE) ×2 IMPLANT
TOWEL OR 17X24 6PK STRL BLUE (TOWEL DISPOSABLE) ×2 IMPLANT
WATER STERILE IRR 1000ML POUR (IV SOLUTION) ×2 IMPLANT

## 2017-04-10 NOTE — Anesthesia Preprocedure Evaluation (Addendum)
Anesthesia Evaluation  Patient identified by MRN, date of birth, ID band Patient awake    Reviewed: Allergy & Precautions, NPO status , Patient's Chart, lab work & pertinent test results  History of Anesthesia Complications (+) PONV and history of anesthetic complications  Airway Mallampati: II  TM Distance: >3 FB Neck ROM: Full    Dental  (+) Missing, Poor Dentition,    Pulmonary neg pulmonary ROS, former smoker,    breath sounds clear to auscultation       Cardiovascular negative cardio ROS   Rhythm:Regular Rate:Normal     Neuro/Psych Anxiety  Neuromuscular disease negative neurological ROS     GI/Hepatic negative GI ROS, Neg liver ROS,   Endo/Other  negative endocrine ROS  Renal/GU negative Renal ROS  negative genitourinary   Musculoskeletal negative musculoskeletal ROS (+)   Abdominal   Peds negative pediatric ROS (+)  Hematology negative hematology ROS (+)   Anesthesia Other Findings Day of surgery medications reviewed with the patient.  Reproductive/Obstetrics negative OB ROS                            Lab Results  Component Value Date   WBC 8.1 08/17/2016   HGB 14.1 08/17/2016   HCT 40.7 08/17/2016   MCV 86.8 08/17/2016   PLT 268 08/17/2016   Lab Results  Component Value Date   CREATININE 0.85 08/17/2016   BUN 9 08/17/2016   NA 139 08/17/2016   K 4.0 08/17/2016   CL 104 08/17/2016   CO2 29 08/17/2016   Lab Results  Component Value Date   INR 1.0 06/28/2014   EKG: sinus tachycardia.  Anesthesia Physical Anesthesia Plan  ASA: II  Anesthesia Plan: General   Post-op Pain Management:    Induction: Intravenous  Airway Management Planned: LMA  Additional Equipment:   Intra-op Plan:   Post-operative Plan: Extubation in OR  Informed Consent: I have reviewed the patients History and Physical, chart, labs and discussed the procedure including the risks,  benefits and alternatives for the proposed anesthesia with the patient or authorized representative who has indicated his/her understanding and acceptance.   Dental advisory given  Plan Discussed with: CRNA  Anesthesia Plan Comments:         Anesthesia Quick Evaluation

## 2017-04-10 NOTE — Anesthesia Procedure Notes (Signed)
Procedure Name: LMA Insertion Date/Time: 04/10/2017 9:49 AM Performed by: Gar GibbonKEETON, Maridel Pixler S Pre-anesthesia Checklist: Patient identified, Emergency Drugs available, Suction available and Patient being monitored Patient Re-evaluated:Patient Re-evaluated prior to inductionOxygen Delivery Method: Circle system utilized Preoxygenation: Pre-oxygenation with 100% oxygen Intubation Type: IV induction Ventilation: Mask ventilation without difficulty LMA: LMA inserted LMA Size: 4.0 Number of attempts: 1 Airway Equipment and Method: Bite block Placement Confirmation: positive ETCO2 Tube secured with: Tape Dental Injury: Teeth and Oropharynx as per pre-operative assessment

## 2017-04-10 NOTE — Discharge Instructions (Addendum)
Knee Arthroscopy, Care After Refer to this sheet in the next few weeks. These instructions provide you with information about caring for yourself after your procedure. Your health care provider may also give you more specific instructions. Your treatment has been planned according to current medical practices, but problems sometimes occur. Call your health care provider if you have any problems or questions after your procedure. What can I expect after the procedure? After the procedure, it is common to have:  Soreness.  Pain. Follow these instructions at home: Bathing   Do not take baths, swim, or use a hot tub until your health care provider approves. Incision care   There are many different ways to close and cover an incision, including stitches, skin glue, and adhesive strips. Follow your health care providers instructions about:  Incision care.  Bandage (dressing) changes and removal.  Incision closure removal.  Check your incision area every day for signs of infection. Watch for:  Redness, swelling, or pain.  Fluid, blood, or pus. Activity   Avoid strenuous activities for as long as directed by your health care provider.  Return to your normal activities as directed by your health care provider. Ask your health care provider what activities are safe for you.  Perform range-of-motion exercises only as directed by your health care provider.  Do not lift anything that is heavier than 10 lb (4.5 kg).  Do not drive or operate heavy machinery while taking pain medicine.  If you were given crutches, use them as directed by your health care provider. Managing pain, stiffness, and swelling   If directed, apply ice to the injured area:  Put ice in a plastic bag.  Place a towel between your skin and the bag.  Leave the ice on for 20 minutes, 2-3 times per day.  Raise the injured area above the level of your heart while you are sitting or lying down as directed by your  health care provider. General instructions   Keep all follow-up visits as directed by your health care provider. This is important.  Take medicines only as directed by your health care provider.  Do not use any tobacco products, including cigarettes, chewing tobacco, or electronic cigarettes. If you need help quitting, ask your health care provider.  If you were given compression stockings, wear them as directed by your health care provider. These stockings help prevent blood clots and reduce swelling in your legs. Contact a health care provider if:  You have severe pain with any movement of your knee.  You notice a bad smell coming from the incision or dressing.  You have redness, swelling, or pain at the site of your incision.  You have fluid, blood, or pus coming from your incision. Get help right away if:  You develop a rash.  You have a fever.  You have difficulty breathing or have shortness of breath.  You develop pain in your calves or in the back of your knee.  You develop chest pain.  You develop numbness or tingling in your leg or foot. This information is not intended to replace advice given to you by your health care provider. Make sure you discuss any questions you have with your health care provider. Post Anesthesia Home Care Instructions  Activity: Get plenty of rest for the remainder of the day. A responsible individual must stay with you for 24 hours following the procedure.  For the next 24 hours, DO NOT: -Drive a car -Advertising copywriter -Drink alcoholic beverages -  Take any medication unless instructed by your physician -Make any legal decisions or sign important papers.  Meals: Start with liquid foods such as gelatin or soup. Progress to regular foods as tolerated. Avoid greasy, spicy, heavy foods. If nausea and/or vomiting occur, drink only clear liquids until the nausea and/or vomiting subsides. Call your physician if vomiting continues.  Special  Instructions/Symptoms: Your throat may feel dry or sore from the anesthesia or the breathing tube placed in your throat during surgery. If this causes discomfort, gargle with warm salt water. The discomfort should disappear within 24 hours.  If you had a scopolamine patch placed behind your ear for the management of post- operative nausea and/or vomiting:  1. The medication in the patch is effective for 72 hours, after which it should be removed.  Wrap patch in a tissue and discard in the trash. Wash hands thoroughly with soap and water. 2. You may remove the patch earlier than 72 hours if you experience unpleasant side effects which may include dry mouth, dizziness or visual disturbances. 3. Avoid touching the patch. Wash your hands with soap and water after contact with the patch.    Document Released: 05/30/2005 Document Revised: 04/11/2016 Document Reviewed: 11/06/2014 Elsevier Interactive Patient Education  2017 [employer]

## 2017-04-10 NOTE — Anesthesia Postprocedure Evaluation (Signed)
Anesthesia Post Note  Patient: Brenda Sellers  Procedure(s) Performed: Procedure(s) (LRB): ARTHROSCOPY KNEE (Right) KNEE ARTHROSCOPY WITH  PARTIAL LATERAL MENISECTOMY (Right) CHONDROPLASTY (Right)  Patient location during evaluation: PACU Anesthesia Type: General Level of consciousness: awake and alert Pain management: pain level controlled Vital Signs Assessment: post-procedure vital signs reviewed and stable Respiratory status: spontaneous breathing, nonlabored ventilation, respiratory function stable and patient connected to nasal cannula oxygen Cardiovascular status: blood pressure returned to baseline and stable Anesthetic complications: no Comments: Intermittent N/V despite scopolamine patch, ondansetron, dexamethasone, phenergan and metoclopramide. Recommend TIVA in the future.        Last Vitals:  Vitals:   04/10/17 1044 04/10/17 1221  BP:  (!) 141/74  Pulse: (!) 111 (!) 103  Resp: 18 16  Temp: 36.4 C 36.8 C    Last Pain:  Vitals:   04/10/17 1221  TempSrc: Oral  PainSc: 3                  Brenda Sellers

## 2017-04-10 NOTE — Op Note (Signed)
Pre-Op ZO:XWRUEx:Right knee lateral meniscal tear, chondromalacia  Postop AV:WUJWx:Same   Procedure:Right knee arthroscopic partial lateral meniscectomy and debridement chondromalacia grade 3 from the posterior aspect of the lateral tibial plateau and lateral facet of the patella, As well as the lateral aspect of the distal femur focally.  Surgeon: Feliberto GottronFrank J. Turner Danielsowan M.D.  Assist: Tomi LikensEric K. Gaylene BrooksPhillips PA-C  (present throughout entire procedure and necessary for timely completion of the procedure) Anes: General LMA  EBL: Minimal  Fluids: 800 cc   Indications: Status post injury at work with catching popping and pain in the right knee.. Pt has failed conservative treatment with anti-inflammatory medicines, physical therapy, and modified activites but did get good temporarily from an intra-articular cortisone injection. MRI scan is consistent with lateral meniscal tear and chondromalaciaPain has recurred and patient desires elective arthroscopic evaluation and treatment of knee. Risks and benefits of surgery have been discussed and questions answered.  Procedure: Patient identified by arm band and taken to the operating room at the day surgery Center. The appropriate anesthetic monitors were attached, and General LMA anesthesia was induced without difficulty. Lateral post was applied to the table and the lower extremity was prepped and draped in usual sterile fashion from the ankle to the midthigh. Time out procedure was performed. We began the operation by making standard inferior lateral and inferior medial peripatellar portals with a #11 blade allowing introduction of the arthroscope through the inferior lateral portal and the out flow to the inferior medial portal. Pump pressure was set at 100 mmHg and diagnostic arthroscopy  revealed Grade III chondromalacia lateral facet of the patella that was debrided with a 3.5 mm Gator sucker shaver back to a stable margin.  The medial compartment was in excellent condition.  The  cruciate ligaments were in excellent condition.  Lateral compartment had complex tearing of the lateral meniscus starting at mid lateral and going forward.  This was debrided with a up biter, 4, to gray-white sucker shaver and a 3 5 Gator sucker shaver.  There is also chondromalacia to the posterior aspect of the lateral tibial condyle, grade 3, that was debrided as well as a focal area on the lateral femoral condyle distally and laterally.. The knee was irrigated out normal saline solution. A dressing of xerofoam 4 x 4 dressing sponges, web roll and an Ace wrap was applied. The patient was awakened extubated and taken to the recovery without difficulty.    Signed: Nestor LewandowskyFrank J Babe Anthis, MD

## 2017-04-10 NOTE — Transfer of Care (Signed)
Immediate Anesthesia Transfer of Care Note  Patient: Brenda Sellers  Procedure(s) Performed: Procedure(s): ARTHROSCOPY KNEE (Right) KNEE ARTHROSCOPY WITH  PARTIAL LATERAL MENISECTOMY (Right) CHONDROPLASTY (Right)  Patient Location: PACU  Anesthesia Type:General  Level of Consciousness: awake, sedated and patient cooperative  Airway & Oxygen Therapy: Patient Spontanous Breathing and Patient connected to face mask oxygen  Post-op Assessment: Report given to RN and Post -op Vital signs reviewed and stable  Post vital signs: Reviewed and stable  Last Vitals:  Vitals:   04/10/17 0914 04/10/17 1044  BP: (!) 123/56   Pulse: 90 (!) 111  Resp: 18 18  Temp: 36.8 C 36.4 C    Last Pain:  Vitals:   04/10/17 0914  TempSrc: Oral         Complications: No apparent anesthesia complications

## 2017-04-10 NOTE — Interval H&P Note (Signed)
History and Physical Interval Note:  04/10/2017 9:22 AM  Brenda Sellers  has presented today for surgery, with the diagnosis of RIGHT KNEE LATERAL MENISCAL TEAR AND CHONDROMALACIA  The various methods of treatment have been discussed with the patient and family. After consideration of risks, benefits and other options for treatment, the patient has consented to  Procedure(s): ARTHROSCOPY KNEE (Right) as a surgical intervention .  The patient's history has been reviewed, patient examined, no change in status, stable for surgery.  I have reviewed the patient's chart and labs.  Questions were answered to the patient's satisfaction.     Nestor LewandowskyOWAN,Micael Barb J

## 2017-04-13 ENCOUNTER — Encounter (HOSPITAL_BASED_OUTPATIENT_CLINIC_OR_DEPARTMENT_OTHER): Payer: Self-pay | Admitting: Orthopedic Surgery

## 2017-04-15 ENCOUNTER — Ambulatory Visit: Payer: PRIVATE HEALTH INSURANCE | Attending: Orthopedic Surgery | Admitting: Physical Therapy

## 2017-04-15 DIAGNOSIS — M25561 Pain in right knee: Secondary | ICD-10-CM | POA: Insufficient documentation

## 2017-04-15 DIAGNOSIS — M6281 Muscle weakness (generalized): Secondary | ICD-10-CM | POA: Insufficient documentation

## 2017-04-15 DIAGNOSIS — M25661 Stiffness of right knee, not elsewhere classified: Secondary | ICD-10-CM | POA: Insufficient documentation

## 2017-04-15 DIAGNOSIS — R269 Unspecified abnormalities of gait and mobility: Secondary | ICD-10-CM | POA: Diagnosis present

## 2017-04-16 ENCOUNTER — Encounter: Payer: Self-pay | Admitting: Physical Therapy

## 2017-04-16 NOTE — Therapy (Addendum)
[facility] [address] [place], [place], [address] Phone: [phone]   Fax:  [phone]  Physical Therapy Evaluation  Patient Details  Name: Sampson SiStacy I Michelini MRN: [id number] Date of Birth: 12/02/1980 Referring Provider: Gean BirchwoodFrank Rowan, MD  Encounter Date: 04/15/2017  1 of 8 treatments.  Reevaluation: 05/13/17  Past Medical History:  Diagnosis Date  . Anxiety   . Complication of anesthesia   . Nerve pain   . Pericardial cyst   . PONV (postoperative nausea and vomiting)    Last surgery 06/2014- nausea not vomitting was pre medicated.    Past Surgical History:  Procedure Laterality Date  . ABDOMINAL HYSTERECTOMY    . ANTERIOR CRUCIATE LIGAMENT REPAIR Left 1998   Menicus, PCL Screw  . CHOLECYSTECTOMY    . CHONDROPLASTY Right 04/10/2017   Procedure: CHONDROPLASTY;  Surgeon: Gean Birchwoodowan, Frank, MD;  Location: [facility];  Service: Orthopedics;  Laterality: Right;  . KNEE ARTHROSCOPY Right    Menicus  . KNEE ARTHROSCOPY Right 04/10/2017   Procedure: ARTHROSCOPY KNEE;  Surgeon: Gean Birchwoodowan, Frank, MD;  Location: [facility];  Service: Orthopedics;  Laterality: Right;  . KNEE ARTHROSCOPY WITH LATERAL MENISECTOMY Right 04/10/2017   Procedure: KNEE ARTHROSCOPY WITH  PARTIAL LATERAL MENISECTOMY;  Surgeon: Gean Birchwoodowan, Frank, MD;  Location: [facility];  Service: Orthopedics;  Laterality: Right;  . LAPAROSCOPIC TOTAL HYSTERECTOMY  07/04/14  . ORIF ELBOW FRACTURE Left 04/04/2015   Procedure: OPEN REDUCTION INTERNAL FIXATION (ORIF) LEFT ELBOW/OLECRANON FRACTURE;  Surgeon: Bradly BienenstockFred Ortmann, MD;  Location: [facility];  Service: Orthopedics;  Laterality: Left;  . TONSILLECTOMY      There were no vitals filed for this visit.     Pt. s/p R medial meniscal repair on 04/10/17.  Pt. reports >5/10 R knee pain with standing/walking.  Pt. entered PT with use of B axillary crutches.         See HEP.  Gait: pt. Instructed in proper  use of SPC and 2-point gait pattern.  Pt. Demonstrates ability to [facility] to improve gait progression.   Ice to R knee in supine after tx. Session.        Pt. is a pleasant 36 y/o female s/p R medial meniscal repair on 04/10/17 after work-related injury.  Pt. reports >5/10 R knee pain at rest and increase pain with prolonged walking/ standing.  Mild knee joint line swelling: R (47 cm.), L (45 cm).  L knee AROM: +3 to 136 deg.  Decrease R knee AROM: -2 to 113 deg.  Incisions on R knee healing well (3 total).  No LLD.  L LE strength grossly 5/5 MMT.  R LE strength grossly 4/5 MMT.  Pt. ambulates with moderate R antalgic gait pattern with use of B axillary crutches progressing to use of SPC with 2-point gait pattern.  LEFS: 6 out of 80.  Pt. will benefit from skilled PT services to increase R knee ROM/ strength to improve pain-free mobility.            PT Long Term Goals - 04/21/17 1258      PT LONG TERM GOAL #1   Title Pt. independent with HEP to increase R knee AROM to WNL as compared to L knee to improve pain-free mobility.   Baseline R knee -2 to 113 deg. (pain limited).     Time 4   Period Weeks   Status New     PT LONG  TERM GOAL #2   Title Pt. will increase LEFS to >50 out of 80 to improve pain-free mobility/ normalized gait pattern.     Baseline LEFS: 6 out of 80.     Time 4   Period Weeks   Status New     PT LONG TERM GOAL #3   Title Pt. able to ambulate with normalized gait pattern and no assistive device on level surfaces safely.     Baseline R antalgic gait pattern with use of SPC   Time 4   Period Weeks   Status New     PT LONG TERM GOAL #4   Title Pt. able to ascend/descend 10 steps with recip. pattern and no R knee pain to improve functional mobility.     Baseline Ascends stairs with recip. pattern and descends with step to pattern.    Time 4   Period Weeks   Status New     PT LONG TERM GOAL #5   Title Pt. able to return to work  with no R knee pain/limitations.     Baseline pt. currently out of work   Time 4   Period Weeks   Status New        Patient will benefit from skilled therapeutic intervention in order to improve the following deficits and impairments:  Abnormal gait, Improper body mechanics, Pain, Decreased mobility, Decreased activity tolerance, Decreased endurance, Decreased range of motion, Decreased strength, Hypomobility, Impaired flexibility, Decreased balance, Difficulty walking, Increased edema  Visit Diagnosis: Acute pain of right knee  Gait difficulty  Muscle weakness (generalized)  Joint stiffness of knee, right     Problem List Patient Active Problem List   Diagnosis Date Noted  . Left radial head fracture 04/04/2015   Cammie Mcgee, PT, DPT # [id number] 04/16/2017, 1:05 PM  [facility] [address] [place], [place], [address] Phone: [phone]   Fax:  [phone]  Name: TRENIKA HUDSON MRN: [id number] Date of Birth: 26-May-1981

## 2017-04-21 ENCOUNTER — Encounter: Payer: Self-pay | Admitting: Physical Therapy

## 2017-04-21 ENCOUNTER — Ambulatory Visit: Payer: PRIVATE HEALTH INSURANCE | Attending: Orthopedic Surgery | Admitting: Physical Therapy

## 2017-04-21 DIAGNOSIS — R269 Unspecified abnormalities of gait and mobility: Secondary | ICD-10-CM | POA: Insufficient documentation

## 2017-04-21 DIAGNOSIS — M6281 Muscle weakness (generalized): Secondary | ICD-10-CM | POA: Diagnosis not present

## 2017-04-21 DIAGNOSIS — M25661 Stiffness of right knee, not elsewhere classified: Secondary | ICD-10-CM | POA: Diagnosis not present

## 2017-04-21 DIAGNOSIS — M25561 Pain in right knee: Secondary | ICD-10-CM | POA: Insufficient documentation

## 2017-04-21 NOTE — Therapy (Signed)
[facility] [address] [place], [place], [address] Phone: [phone]   Fax:  [phone]  Physical Therapy Treatment  Patient Details  Name: Brenda Sellers MRN: [id number] Date of Birth: [birth year] Referring Provider: Gean Birchwood, MD  Encounter Date: 04/21/2017      PT End of Session - 04/21/17 1336    Visit Number 2   Number of Visits 8   Date for PT Re-Evaluation 05/13/17   PT Start Time 0928   PT Stop Time 1029   PT Time Calculation (min) 61 min   Activity Tolerance Patient tolerated treatment well   Behavior During Therapy [facility] for tasks assessed/performed      Past Medical History:  Diagnosis Date  . Anxiety   . Complication of anesthesia   . Nerve pain   . Pericardial cyst   . PONV (postoperative nausea and vomiting)    Last surgery 06/2014- nausea not vomitting was pre medicated.    Past Surgical History:  Procedure Laterality Date  . ABDOMINAL HYSTERECTOMY    . ANTERIOR CRUCIATE LIGAMENT REPAIR Left 1998   Menicus, PCL Screw  . CHOLECYSTECTOMY    . CHONDROPLASTY Right 04/10/2017   Procedure: CHONDROPLASTY;  Surgeon: Gean Birchwood, MD;  Location: [facility];  Service: Orthopedics;  Laterality: Right;  . KNEE ARTHROSCOPY Right    Menicus  . KNEE ARTHROSCOPY Right 04/10/2017   Procedure: ARTHROSCOPY KNEE;  Surgeon: Gean Birchwood, MD;  Location: [facility];  Service: Orthopedics;  Laterality: Right;  . KNEE ARTHROSCOPY WITH LATERAL MENISECTOMY Right 04/10/2017   Procedure: KNEE ARTHROSCOPY WITH  PARTIAL LATERAL MENISECTOMY;  Surgeon: Gean Birchwood, MD;  Location: [facility];  Service: Orthopedics;  Laterality: Right;  . LAPAROSCOPIC TOTAL HYSTERECTOMY  07/04/14  . ORIF ELBOW FRACTURE Left 04/04/2015   Procedure: OPEN REDUCTION INTERNAL FIXATION (ORIF) LEFT ELBOW/OLECRANON FRACTURE;  Surgeon: Bradly Bienenstock, MD;  Location: [facility];  Service: Orthopedics;  Laterality: Left;  .  TONSILLECTOMY      There were no vitals filed for this visit.      Subjective Assessment - 04/21/17 1323    Subjective Pt. reports that she fell a few days ago at home due to R knee "popping" and buckling.  Pt. reports falling towards couch and didn't land on R knee. Pt. reports increase R knee swelling after fall but no brusing or warmth around R knee joint line noted today.  Pt. reports 4-6/10 R knee pain while walking into PT clinic with use of SPC.  Pt. reports good compliance with HEP 2x/day and ice.     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  Pt. kicked in R knee by pt. resulting in meniscal tear/ pain.  Pt. was initially immobilized and told to rest but pain never got better.     Limitations Standing;Walking;House hold activities;Lifting   Diagnostic tests MRI   Patient Stated Goals Increase R knee ROM/ strength to improve pain-free mobility/ return to work without limitaitons.     Currently in Pain? Yes   Pain Score 6    Pain Location Knee   Pain Orientation Right   Pain Descriptors / Indicators Aching       OBJECTIVE:  There.ex.: Reviewed HEP.  Nustep L6 10 min. B UE/LE seat 9 (no increase c/o pain).  TG knee flexion 30x/ heel raises 20x.  Standing hip flexion/ abd. 20x.  Tandem gait/ stance.  Gait training: amb. In  clinic with use of SPC working on consistent step pattern/ cadence/ proper use of SPC for 2-point gait.  Discussed knee brace.  Recip. Stair climbing and step to descending with use of SPC/ 1 handrail assist.  Ice to R knee in supine after tx. Session.        Pt response for medical necessity:  Benefits from skilled PT services to increase R knee ROM/ strength to improve pain-free mobility/ return to work.          PT Long Term Goals - 04/21/17 1258      PT LONG TERM GOAL #1   Title Pt. independent with HEP to increase R knee AROM to WNL as compared to L knee to improve pain-free mobility.   Baseline R knee -2 to 113 deg. (pain  limited).     Time 4   Period Weeks   Status New     PT LONG TERM GOAL #2   Title Pt. will increase LEFS to >50 out of 80 to improve pain-free mobility/ normalized gait pattern.     Baseline LEFS: 6 out of 80.     Time 4   Period Weeks   Status New     PT LONG TERM GOAL #3   Title Pt. able to ambulate with normalized gait pattern and no assistive device on level surfaces safely.     Baseline R antalgic gait pattern with use of SPC   Time 4   Period Weeks   Status New     PT LONG TERM GOAL #4   Title Pt. able to ascend/descend 10 steps with recip. pattern and no R knee pain to improve functional mobility.     Baseline Ascends stairs with recip. pattern and descends with step to pattern.    Time 4   Period Weeks   Status New     PT LONG TERM GOAL #5   Title Pt. able to return to work with no R knee pain/limitations.     Baseline pt. currently out of work   Time 4   Period Weeks   Status New               Plan - 04/21/17 1337    Clinical Impression Statement Marked increase in R knee flexion after supine stretches/ther.ex. today as compared to initial evaluation (141 deg. R knee flexion)- supine position.  Pt. ambulates with improved 2-point gait pattern with use of SPC and able to ascend stairs with recip. pattern and descend with step to pattern.  Pt. had frequent "popping" of R knee (no buckling).  PT discussed use of R hinged brace to support knee with walking/return to work.       Clinical Presentation Stable   Rehab Potential Excellent   PT Frequency 2x / week   PT Duration 4 weeks   PT Treatment/Interventions ADLs/Self Care Home Management;Cryotherapy;Moist Heat;Neuromuscular re-education;Gait training;Stair training;Functional mobility training;Therapeutic activities;Therapeutic exercise;Balance training;Patient/family education;Manual techniques;Passive range of motion   PT Next Visit Plan Progress HEP/ discuss HEP   PT Home Exercise Plan see HEP   Consulted  and Agree with Plan of Care Patient      Patient will benefit from skilled therapeutic intervention in order to improve the following deficits and impairments:  Abnormal gait, Improper body mechanics, Pain, Decreased mobility, Decreased activity tolerance, Decreased endurance, Decreased range of motion, Decreased strength, Hypomobility, Impaired flexibility, Decreased balance, Difficulty walking, Increased edema  Visit Diagnosis: Acute pain of right knee  Gait difficulty  Muscle  weakness (generalized)  Joint stiffness of knee, right     Problem List Patient Active Problem List   Diagnosis Date Noted  . Left radial head fracture 04/04/2015   Cammie McgeeMichael C Simran Mannis, PT, DPT # [id number] 04/21/2017, 3:37 PM  [facility] [address] [place], [place], [address] Phone: [phone]   Fax:  [phone]  Name: Sampson SiStacy I Yeakle MRN: [id number] Date of Birth: 1980/11/26

## 2017-04-21 NOTE — Addendum Note (Signed)
Addended by: Dorene GrebeSHERK, MICHAEL C on: 04/21/2017 01:09 PM   Modules accepted: Orders

## 2017-04-23 ENCOUNTER — Encounter: Payer: Self-pay | Admitting: Physical Therapy

## 2017-04-23 ENCOUNTER — Ambulatory Visit: Payer: PRIVATE HEALTH INSURANCE | Admitting: Physical Therapy

## 2017-04-23 NOTE — Therapy (Signed)
[facility] [address] [place], [place], [address] Phone: [phone]   Fax:  [phone]  Physical Therapy Treatment  Patient Details  Name: Brenda Sellers MRN: [id number] Date of Birth: 1981-03-08 Referring Provider: Gean Birchwood, MD  Encounter Date: 04/23/2017      PT End of Session - 04/23/17 0935    Visit Number --   Number of Visits --   Date for PT Re-Evaluation --   Activity Tolerance --   Behavior During Therapy --      Past Medical History:  Diagnosis Date  . Anxiety   . Complication of anesthesia   . Nerve pain   . Pericardial cyst   . PONV (postoperative nausea and vomiting)    Last surgery 06/2014- nausea not vomitting was pre medicated.    Past Surgical History:  Procedure Laterality Date  . ABDOMINAL HYSTERECTOMY    . ANTERIOR CRUCIATE LIGAMENT REPAIR Left 1998   Menicus, PCL Screw  . CHOLECYSTECTOMY    . CHONDROPLASTY Right 04/10/2017   Procedure: CHONDROPLASTY;  Surgeon: Gean Birchwood, MD;  Location: [facility];  Service: Orthopedics;  Laterality: Right;  . KNEE ARTHROSCOPY Right    Menicus  . KNEE ARTHROSCOPY Right 04/10/2017   Procedure: ARTHROSCOPY KNEE;  Surgeon: Gean Birchwood, MD;  Location: [facility];  Service: Orthopedics;  Laterality: Right;  . KNEE ARTHROSCOPY WITH LATERAL MENISECTOMY Right 04/10/2017   Procedure: KNEE ARTHROSCOPY WITH  PARTIAL LATERAL MENISECTOMY;  Surgeon: Gean Birchwood, MD;  Location:  SURGERY CENTER;  Service: Orthopedics;  Laterality: Right;  . LAPAROSCOPIC TOTAL HYSTERECTOMY  07/04/14  . ORIF ELBOW FRACTURE Left 04/04/2015   Procedure: OPEN REDUCTION INTERNAL FIXATION (ORIF) LEFT ELBOW/OLECRANON FRACTURE;  Surgeon: Bradly Bienenstock, MD;  [facility];  Service: Orthopedics;  Laterality: Left;  . TONSILLECTOMY      There were no vitals filed for this visit.      Subjective Assessment - 04/23/17 0934    Subjective Pt. never showed  for scheduled PT appt.  Pt. may have returned to work.  PT called and left voicemail message.     Pertinent History --   Limitations --   Diagnostic tests --   Patient Stated Goals --       NO show        PT Long Term Goals - 04/21/17 1258      PT LONG TERM GOAL #1   Title Pt. independent with HEP to increase R knee AROM to WNL as compared to L knee to improve pain-free mobility.   Baseline R knee -2 to 113 deg. (pain limited).     Time 4   Period Weeks   Status New     PT LONG TERM GOAL #2   Title Pt. will increase LEFS to >50 out of 80 to improve pain-free mobility/ normalized gait pattern.     Baseline LEFS: 6 out of 80.     Time 4   Period Weeks   Status New     PT LONG TERM GOAL #3   Title Pt. able to ambulate with normalized gait pattern and no assistive device on level surfaces safely.     Baseline R antalgic gait pattern with use of SPC   Time 4   Period Weeks   Status New     PT LONG TERM GOAL #4   Title Pt. able to ascend/descend 10 steps with recip. pattern and no R knee pain to  improve functional mobility.     Baseline Ascends stairs with recip. pattern and descends with step to pattern.    Time 4   Period Weeks   Status New     PT LONG TERM GOAL #5   Title Pt. able to return to work with no R knee pain/limitations.     Baseline pt. currently out of work   Time 4   Period Weeks   Status New               Plan - 04/23/17 0935    Clinical Impression Statement No treatment today.  No showed.    Clinical Presentation --   Rehab Potential --   PT Frequency --   PT Duration --   PT Treatment/Interventions --   PT Next Visit Plan Progress HEP/ discuss HEP   PT Home Exercise Plan see HEP   Consulted and Agree with Plan of Care Patient      Patient will benefit from skilled therapeutic intervention in order to improve the following deficits and impairments:     Visit Diagnosis: Acute pain of right knee  Gait difficulty  Muscle  weakness (generalized)  Joint stiffness of knee, right     Problem List Patient Active Problem List   Diagnosis Date Noted  . Left radial head fracture 04/04/2015   Cammie McgeeMichael C Canisha Issac, PT, DPT # [id number] 04/23/2017, 3:46 PM  [facility] [address] [place], [place], [address] Phone: [phone]   Fax:  [phone]  Name: Sampson SiStacy I Saefong MRN: [id number] Date of Birth: [birth year]

## 2017-04-24 NOTE — Addendum Note (Signed)
Addendum  created 04/24/17 1147 by Radie Berges D, MD   Sign clinical note    

## 2017-04-24 NOTE — Anesthesia Postprocedure Evaluation (Signed)
Anesthesia Post Note  Patient: Brenda Sellers  Procedure(s) Performed: Procedure(s) (LRB): ARTHROSCOPY KNEE (Right) KNEE ARTHROSCOPY WITH  PARTIAL LATERAL MENISECTOMY (Right) CHONDROPLASTY (Right)     Anesthesia Post Evaluation  Last Vitals:  Vitals:   04/10/17 1044 04/10/17 1221  BP:  (!) 141/74  Pulse: (!) 111 (!) 103  Resp: 18 16  Temp: 36.4 C 36.8 C    Last Pain:  Vitals:   04/13/17 0914  TempSrc:   PainSc: 2                  Shelton SilvasKevin D Gibran Veselka

## 2017-04-28 ENCOUNTER — Ambulatory Visit: Payer: PRIVATE HEALTH INSURANCE | Attending: Orthopedic Surgery | Admitting: Physical Therapy

## 2017-04-28 ENCOUNTER — Encounter: Payer: Self-pay | Admitting: Physical Therapy

## 2017-04-28 DIAGNOSIS — M6281 Muscle weakness (generalized): Secondary | ICD-10-CM | POA: Diagnosis present

## 2017-04-28 DIAGNOSIS — M25561 Pain in right knee: Secondary | ICD-10-CM | POA: Insufficient documentation

## 2017-04-28 DIAGNOSIS — R269 Unspecified abnormalities of gait and mobility: Secondary | ICD-10-CM | POA: Diagnosis present

## 2017-04-28 DIAGNOSIS — M25661 Stiffness of right knee, not elsewhere classified: Secondary | ICD-10-CM | POA: Insufficient documentation

## 2017-04-28 NOTE — Therapy (Signed)
[facility] [address] [place], [place], [address] Phone: [phone]   Fax:  [phone]  Physical Therapy Treatment  Patient Details  Name: Brenda Sellers MRN: [id number] Date of Birth: [birth year] Referring Provider: Gean Birchwood, MD  Encounter Date: 04/28/2017      PT End of Session - 04/28/17 0947    Visit Number 3   Number of Visits 8   Date for PT Re-Evaluation 05/13/17   PT Start Time 0942   PT Stop Time 1044   PT Time Calculation (min) 62 min   Activity Tolerance Patient tolerated treatment well   Behavior During Therapy [facility] for tasks assessed/performed      Past Medical History:  Diagnosis Date  . Anxiety   . Complication of anesthesia   . Nerve pain   . Pericardial cyst   . PONV (postoperative nausea and vomiting)    Last surgery 06/2014- nausea not vomitting was pre medicated.    Past Surgical History:  Procedure Laterality Date  . ABDOMINAL HYSTERECTOMY    . ANTERIOR CRUCIATE LIGAMENT REPAIR Left 1998   Menicus, PCL Screw  . CHOLECYSTECTOMY    . CHONDROPLASTY Right 04/10/2017   Procedure: CHONDROPLASTY;  Surgeon: Gean Birchwood, MD;  Location: [facility];  Service: Orthopedics;  Laterality: Right;  . KNEE ARTHROSCOPY Right    Menicus  . KNEE ARTHROSCOPY Right 04/10/2017   Procedure: ARTHROSCOPY KNEE;  Surgeon: Gean Birchwood, MD;  Location: [facility];  Service: Orthopedics;  Laterality: Right;  . KNEE ARTHROSCOPY WITH LATERAL MENISECTOMY Right 04/10/2017   Procedure: KNEE ARTHROSCOPY WITH  PARTIAL LATERAL MENISECTOMY;  Surgeon: Gean Birchwood, MD;  Location:  SURGERY CENTER;  Service: Orthopedics;  Laterality: Right;  . LAPAROSCOPIC TOTAL HYSTERECTOMY  07/04/14  . ORIF ELBOW FRACTURE Left 04/04/2015   Procedure: OPEN REDUCTION INTERNAL FIXATION (ORIF) LEFT ELBOW/OLECRANON FRACTURE;  Surgeon: Bradly Bienenstock, MD;  [facility];  Service: Orthopedics;  Laterality: Left;  .  TONSILLECTOMY      There were no vitals filed for this visit.      Subjective Assessment - 04/28/17 0945    Subjective Pt. reports 3 days of muscle soreness after last tx. session (calf muscles mainly).  Pt. reports 3/10 R medial knee pain currently.  Pt. had f/u with Dr. Turner Daniels and has been released to return to work starting today.  Pt. wearing R knee brace into PT clinic.  Pt. 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  Pt. returns to MD on the 19th of June.  Pt. 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  Pt. kicked in R knee by pt. resulting in meniscal tear/ pain.  Pt. was initially immobilized and told to rest but pain never got better.     Limitations Standing;Walking;House hold activities;Lifting   Diagnostic tests MRI   Patient Stated Goals Increase R knee ROM/ strength to improve pain-free mobility/ return to work without limitaitons.     Currently in Pain? Yes   Pain Score 3    Pain Location Knee   Pain Orientation Right   Pain Descriptors / Indicators Aching   Pain Type Surgical pain;Acute pain       131 deg. R knee flexion AROM in supine position.  Good R patellar mobility.    OBJECTIVE:  There.ex.:  Scifit L4 12 min. B UE/LE (3-4/10 R knee pain).  Ambulate in hallway with high marching/ alt. UE and LE touches/ lateral step pattern 45 feet x 2.  //-bar braiding L/R 4x each (slight increase in pain).  Ascend/descend stairs with recip. Pattern and no UE assist (marked improvement).  Tandem gait/ stance (no UE assist).  Supine R knee to chest with yellow ball/ bridging with white ball 10x2.  Ambulate outside without use of SPC on grassy terrain/ curbs.  Squats at //-bars with mirror feedback 10x2 (6/10 R knee pain).  Manual tx.: supine R knee AA/PROM flexion and hamstring stretches 5x each with static holds.  STM to L distal quad/knee in supine.  Patellar  mobs. (all planes).    Ice to R knee in supine after tx. Session.                   Pt response for medical necessity:  Benefits from skilled PT services to increase R knee ROM/ strength to improve pain-free mobility/ return to work.          PT Long Term Goals - 04/21/17 1258      PT LONG TERM GOAL #1   Title Pt. independent with HEP to increase R knee AROM to WNL as compared to L knee to improve pain-free mobility.   Baseline R knee -2 to 113 deg. (pain limited).     Time 4   Period Weeks   Status New     PT LONG TERM GOAL #2   Title Pt. will increase LEFS to >50 out of 80 to improve pain-free mobility/ normalized gait pattern.     Baseline LEFS: 6 out of 80.     Time 4   Period Weeks   Status New     PT LONG TERM GOAL #3   Title Pt. able to ambulate with normalized gait pattern and no assistive device on level surfaces safely.     Baseline R antalgic gait pattern with use of SPC   Time 4   Period Weeks   Status New     PT LONG TERM GOAL #4   Title Pt. able to ascend/descend 10 steps with recip. pattern and no R knee pain to improve functional mobility.     Baseline Ascends stairs with recip. pattern and descends with step to pattern.    Time 4   Period Weeks   Status New     PT LONG TERM GOAL #5   Title Pt. able to return to work with no R knee pain/limitations.     Baseline pt. currently out of work   Time 4   Period Weeks   Status New               Plan - 04/28/17 1400    Clinical Impression Statement Moderate LE muscle fatigue and R knee "popping" during ther.ex.  Pt. showing marked increase in R knee AROM and able to ambulate with less of an antalgic gait pattern.  Pt. will continue to benefit from skilled PT services to increase R knee ROM/ strength to improve pain-free mobility.     Clinical Presentation Stable   Rehab Potential Excellent   PT Frequency 2x / week   PT Duration 4 weeks   PT Treatment/Interventions ADLs/Self Care Home  Management;Cryotherapy;Moist Heat;Neuromuscular re-education;Gait training;Stair training;Functional mobility training;Therapeutic activities;Therapeutic exercise;Balance training;Patient/family education;Manual techniques;Passive range of motion   PT Next Visit Plan Progress HEP/ discuss return to work.  PT Home Exercise Plan see HEP   Consulted and Agree with Plan of Care Patient      Patient will benefit from skilled therapeutic intervention in order to improve the following deficits and impairments:  Abnormal gait, Improper body mechanics, Pain, Decreased mobility, Decreased activity tolerance, Decreased endurance, Decreased range of motion, Decreased strength, Hypomobility, Impaired flexibility, Decreased balance, Difficulty walking, Increased edema  Visit Diagnosis: Acute pain of right knee  Gait difficulty  Muscle weakness (generalized)  Joint stiffness of knee, right     Problem List Patient Active Problem List   Diagnosis Date Noted  . Left radial head fracture 04/04/2015   Cammie Mcgee, PT, DPT # [id number] 04/28/2017, 2:24 PM  [facility] [address] [place], [place], [address] Phone: [phone]   Fax:  [phone]  Name: CHERLY ERNO MRN: [id number] Date of Birth: 1981/04/23

## 2017-04-30 ENCOUNTER — Encounter: Payer: Self-pay | Admitting: Physical Therapy

## 2017-04-30 ENCOUNTER — Ambulatory Visit: Payer: PRIVATE HEALTH INSURANCE | Admitting: Physical Therapy

## 2017-04-30 DIAGNOSIS — M6281 Muscle weakness (generalized): Secondary | ICD-10-CM

## 2017-04-30 DIAGNOSIS — M25561 Pain in right knee: Secondary | ICD-10-CM

## 2017-04-30 DIAGNOSIS — R269 Unspecified abnormalities of gait and mobility: Secondary | ICD-10-CM

## 2017-04-30 DIAGNOSIS — M25661 Stiffness of right knee, not elsewhere classified: Secondary | ICD-10-CM

## 2017-04-30 NOTE — Therapy (Signed)
[facility] [address] [place], [place], [address] Phone: [phone]   Fax:  [phone]  Physical Therapy Treatment  Patient Details  Name: Brenda Sellers MRN: [id number] Date of Birth: 1981/01/31 Referring Provider: Gean Birchwood, MD  Encounter Date: 04/30/2017      PT End of Session - 04/30/17 1946    Visit Number 4   Number of Visits 8   Date for PT Re-Evaluation 05/13/17   PT Start Time 0936   PT Stop Time 1028   PT Time Calculation (min) 52 min   Activity Tolerance Patient tolerated treatment well;Patient limited by pain   Behavior During Therapy [facility] for tasks assessed/performed      Past Medical History:  Diagnosis Date  . Anxiety   . Complication of anesthesia   . Nerve pain   . Pericardial cyst   . PONV (postoperative nausea and vomiting)    Last surgery 06/2014- nausea not vomitting was pre medicated.    Past Surgical History:  Procedure Laterality Date  . ABDOMINAL HYSTERECTOMY    . ANTERIOR CRUCIATE LIGAMENT REPAIR Left 1998   Menicus, PCL Screw  . CHOLECYSTECTOMY    . CHONDROPLASTY Right 04/10/2017   Procedure: CHONDROPLASTY;  Surgeon: Gean Birchwood, MD;  Location: [facility];  Service: Orthopedics;  Laterality: Right;  . KNEE ARTHROSCOPY Right    Menicus  . KNEE ARTHROSCOPY Right 04/10/2017   Procedure: ARTHROSCOPY KNEE;  Surgeon: Gean Birchwood, MD;  Location: [facility];  Service: Orthopedics;  Laterality: Right;  . KNEE ARTHROSCOPY WITH LATERAL MENISECTOMY Right 04/10/2017   Procedure: KNEE ARTHROSCOPY WITH  PARTIAL LATERAL MENISECTOMY;  Surgeon: Gean Birchwood, MD;  Location:  SURGERY CENTER;  Service: Orthopedics;  Laterality: Right;  . LAPAROSCOPIC TOTAL HYSTERECTOMY  07/04/14  . ORIF ELBOW FRACTURE Left 04/04/2015   Procedure: OPEN REDUCTION INTERNAL FIXATION (ORIF) LEFT ELBOW/OLECRANON FRACTURE;  Surgeon: Bradly Bienenstock, MD;  [facility];  Service: Orthopedics;   Laterality: Left;  . TONSILLECTOMY      There were no vitals filed for this visit.      Subjective Assessment - 04/30/17 0936    Subjective Pt. reports R LE soreness.  7/10 R posterior knee/ankle pain (pt. reports "it may be from the way I am walking").     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  Pt. kicked in R knee by pt. resulting in meniscal tear/ pain.  Pt. was initially immobilized and told to rest but pain never got better.     Limitations Standing;Walking;House hold activities;Lifting   Diagnostic tests MRI   Patient Stated Goals Increase R knee ROM/ strength to improve pain-free mobility/ return to work without limitaitons.       Objective:  There.ex.:  Scifit L6 B UE/LE 12 min.  TG knee flexion (midline/ toe in/ toe out)/ heel raises- 20x each.  Supine R LE ex.: bicycles/ SLR/ bridging/ hip abd. With GTB 10x2.  Standing prostretch 30 sec. X 2.  Reviewed HEP/ walking.  Manual tx.:  Patellar mobs. (all planes).  R prox. Tibia AP grade III mobs. 4x.  Supine R hamstring/ quad stretches (as tolerated) with static holds 5x each.  STM to R quad/ distal hamstring/ prox. gastroc ("feels good").  Ice to R knee/ ankle after tx. For 10 min.     Pt response for medical necessity: Benefits from skilled PT services to increase R knee ROM/ strength to improve pain-free mobility/  return to work.        PT Long Term Goals - 04/21/17 1258      PT LONG TERM GOAL #1   Title Pt. independent with HEP to increase R knee AROM to WNL as compared to L knee to improve pain-free mobility.   Baseline R knee -2 to 113 deg. (pain limited).     Time 4   Period Weeks   Status New     PT LONG TERM GOAL #2   Title Pt. will increase LEFS to >50 out of 80 to improve pain-free mobility/ normalized gait pattern.     Baseline LEFS: 6 out of 80.     Time 4   Period Weeks   Status New     PT LONG TERM GOAL #3   Title Pt. able to ambulate with normalized gait pattern and no  assistive device on level surfaces safely.     Baseline R antalgic gait pattern with use of SPC   Time 4   Period Weeks   Status New     PT LONG TERM GOAL #4   Title Pt. able to ascend/descend 10 steps with recip. pattern and no R knee pain to improve functional mobility.     Baseline Ascends stairs with recip. pattern and descends with step to pattern.    Time 4   Period Weeks   Status New     PT LONG TERM GOAL #5   Title Pt. able to return to work with no R knee pain/limitations.     Baseline pt. currently out of work   Time 4   Period Weeks   Status New               Plan - 04/30/17 1946    Clinical Impression Statement Pt. more pain limited today in R knee with Scifit/ ther.ex.  R posterior knee and ankle pain today.  No change in swelling noted.  Pt. ambulates with a more normalized gait pattern without SPC/ knee brace.  Pt. will continue to benefit from strength training to promote return to work without limitations.     Rehab Potential Excellent   PT Frequency 2x / week   PT Duration 4 weeks   PT Treatment/Interventions ADLs/Self Care Home Management;Cryotherapy;Moist Heat;Neuromuscular re-education;Gait training;Stair training;Functional mobility training;Therapeutic activities;Therapeutic exercise;Balance training;Patient/family education;Manual techniques;Passive range of motion   PT Next Visit Plan Increase R quad/ LE muscle strength/ pain mgmt.     PT Home Exercise Plan see HEP   Consulted and Agree with Plan of Care Patient      Patient will benefit from skilled therapeutic intervention in order to improve the following deficits and impairments:  Abnormal gait, Improper body mechanics, Pain, Decreased mobility, Decreased activity tolerance, Decreased endurance, Decreased range of motion, Decreased strength, Hypomobility, Impaired flexibility, Decreased balance, Difficulty walking, Increased edema  Visit Diagnosis: Acute pain of right knee  Gait  difficulty  Muscle weakness (generalized)  Joint stiffness of knee, right     Problem List Patient Active Problem List   Diagnosis Date Noted  . Left radial head fracture 04/04/2015   Cammie McgeeMichael C Sherk, PT, DPT # [phone] 04/30/2017, 7:52 PM  [facility] [address] [place], [place], [address] Phone: [phone]   Fax:  [phone]  Name: Brenda Sellers MRN: [id number] Date of Birth: 25-May-1981

## 2017-05-05 ENCOUNTER — Ambulatory Visit: Payer: PRIVATE HEALTH INSURANCE | Admitting: Physical Therapy

## 2017-05-05 ENCOUNTER — Encounter: Payer: Self-pay | Admitting: Physical Therapy

## 2017-05-05 DIAGNOSIS — M6281 Muscle weakness (generalized): Secondary | ICD-10-CM

## 2017-05-05 DIAGNOSIS — M25561 Pain in right knee: Secondary | ICD-10-CM | POA: Diagnosis not present

## 2017-05-05 DIAGNOSIS — M25661 Stiffness of right knee, not elsewhere classified: Secondary | ICD-10-CM

## 2017-05-05 DIAGNOSIS — R269 Unspecified abnormalities of gait and mobility: Secondary | ICD-10-CM

## 2017-05-05 NOTE — Therapy (Signed)
[facility] [address] [place], [place], [address] Phone: [phone]   Fax:  [phone]  Physical Therapy Treatment  Patient Details  Name: Brenda Sellers MRN: [id number] Date of Birth: [birth year] Referring Provider: Gean Birchwood, MD  Encounter Date: 05/05/2017      PT End of Session - 05/05/17 0952    Visit Number 5   Number of Visits 8   Date for PT Re-Evaluation 05/13/17   PT Start Time 0939   PT Stop Time 1024   PT Time Calculation (min) 45 min   Activity Tolerance Patient tolerated treatment well;Patient limited by pain   Behavior During Therapy [facility] for tasks assessed/performed      Past Medical History:  Diagnosis Date  . Anxiety   . Complication of anesthesia   . Nerve pain   . Pericardial cyst   . PONV (postoperative nausea and vomiting)    Last surgery 06/2014- nausea not vomitting was pre medicated.    Past Surgical History:  Procedure Laterality Date  . ABDOMINAL HYSTERECTOMY    . ANTERIOR CRUCIATE LIGAMENT REPAIR Left 1998   Menicus, PCL Screw  . CHOLECYSTECTOMY    . CHONDROPLASTY Right 04/10/2017   Procedure: CHONDROPLASTY;  Surgeon: Gean Birchwood, MD;  Location: [facility];  Service: Orthopedics;  Laterality: Right;  . KNEE ARTHROSCOPY Right    Menicus  . KNEE ARTHROSCOPY Right 04/10/2017   Procedure: ARTHROSCOPY KNEE;  Surgeon: Gean Birchwood, MD;  Location: [facility];  Service: Orthopedics;  Laterality: Right;  . KNEE ARTHROSCOPY WITH LATERAL MENISECTOMY Right 04/10/2017   Procedure: KNEE ARTHROSCOPY WITH  PARTIAL LATERAL MENISECTOMY;  Surgeon: Gean Birchwood, MD;  Location: [facility];  Service: Orthopedics;  Laterality: Right;  . LAPAROSCOPIC TOTAL HYSTERECTOMY  07/04/14  . ORIF ELBOW FRACTURE Left 04/04/2015   Procedure: OPEN REDUCTION INTERNAL FIXATION (ORIF) LEFT ELBOW/OLECRANON FRACTURE;  Surgeon: Bradly Bienenstock, MD;  [facility];  Service: Orthopedics;   Laterality: Left;  . TONSILLECTOMY      There were no vitals filed for this visit.      Subjective Assessment - 05/05/17 0938    Subjective Pt reports that her R knee sometimes feels like it's "catching" which is painful and then other times it feels like she can't bend or straighten her R knee when walking.  This is not consistent with any specific activity. Pt reports she began experiencing pain in posterior R knee ~1 wk ago, ice seems to help with this.    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  Pt. kicked in R knee by pt. resulting in meniscal tear/ pain.  Pt. was initially immobilized and told to rest but pain never got better.     Limitations Standing;Walking;House hold activities;Lifting   Diagnostic tests MRI   Patient Stated Goals Increase R knee ROM/ strength to improve pain-free mobility/ return to work without limitaitons.     Currently in Pain? Yes   Pain Score 5    Pain Location Knee   Pain Orientation Posterior   Pain Descriptors / Indicators Aching   Pain Type Chronic pain   Pain Onset More than a month ago   Pain Frequency Constant   Multiple Pain Sites No       TREATMENT  Manual Therapy:  Patellar mobs grade I-II inf/sup/med/lat 2x20 sec each direction. ?  R prox. Tibia AP grade III mobs. 4x30 seconds. ?  Supine R hamstring  and R DF stretch x30 seconds ?  STM to distal R quad ("feels good").   Therapeutic Exercise: ?  Total Gym squats level 22 2x10  Total gym heel raises 20x each at level 22. ?  Supine RLE bicycles 10x3  R supine SLR 10x3  RLE bridging with GTB 10x3  R Clamshells with GTB 10x3  Standing prostretch for R gastroc stretch 30 sec x 2.  Standing R TKE with GTB 2x15            PT Education - 05/05/17 0951    Education provided Yes   Education Details Exercise technique   Person(s) Educated Patient   Methods Explanation;Demonstration;Verbal cues   Comprehension Verbalized understanding;Returned  demonstration;Verbal cues required;Need further instruction             PT Long Term Goals - 04/21/17 1258      PT LONG TERM GOAL #1   Title Pt. independent with HEP to increase R knee AROM to WNL as compared to L knee to improve pain-free mobility.   Baseline R knee -2 to 113 deg. (pain limited).     Time 4   Period Weeks   Status New     PT LONG TERM GOAL #2   Title Pt. will increase LEFS to >50 out of 80 to improve pain-free mobility/ normalized gait pattern.     Baseline LEFS: 6 out of 80.     Time 4   Period Weeks   Status New     PT LONG TERM GOAL #3   Title Pt. able to ambulate with normalized gait pattern and no assistive device on level surfaces safely.     Baseline R antalgic gait pattern with use of SPC   Time 4   Period Weeks   Status New     PT LONG TERM GOAL #4   Title Pt. able to ascend/descend 10 steps with recip. pattern and no R knee pain to improve functional mobility.     Baseline Ascends stairs with recip. pattern and descends with step to pattern.    Time 4   Period Weeks   Status New     PT LONG TERM GOAL #5   Title Pt. able to return to work with no R knee pain/limitations.     Baseline pt. currently out of work   Time 4   Period Weeks   Status New               Plan - 05/05/17 1003    Clinical Impression Statement Pt responded positively to STM R distal quad with a reported decrease in tightness and pain.  She demonstrates fatigue with RLE strengthening exercises, especially with SLR.  Pt with occasional popping in R knee following strenghtening exercises likely due to tightness and swelling.  Instructed pt to move R knee through AROM several times between strengthening exercises to prevent this which was successful this session. Pt will benefit from continued skilled PT interventions for improved strength, ROM, and functinal use of RLE.     Rehab Potential Excellent   PT Frequency 2x / week   PT Duration 4 weeks   PT  Treatment/Interventions ADLs/Self Care Home Management;Cryotherapy;Moist Heat;Neuromuscular re-education;Gait training;Stair training;Functional mobility training;Therapeutic activities;Therapeutic exercise;Balance training;Patient/family education;Manual techniques;Passive range of motion   PT Next Visit Plan Increase R quad/ LE muscle strength/ pain mgmt.     PT Home Exercise Plan see HEP   Consulted and Agree with Plan of Care Patient  Patient will benefit from skilled therapeutic intervention in order to improve the following deficits and impairments:  Abnormal gait, Improper body mechanics, Pain, Decreased mobility, Decreased activity tolerance, Decreased endurance, Decreased range of motion, Decreased strength, Hypomobility, Impaired flexibility, Decreased balance, Difficulty walking, Increased edema  Visit Diagnosis: Acute pain of right knee  Gait difficulty  Muscle weakness (generalized)  Joint stiffness of knee, right     Problem List Patient Active Problem List   Diagnosis Date Noted  . Left radial head fracture 04/04/2015    Encarnacion Chu PT, DPT 05/05/2017, 10:25 AM  [facility] [address] [place], [place], [address] Phone: [phone]   Fax:  [phone]  Name: Brenda Sellers MRN: [id number] Date of Birth: 12-15-1980

## 2017-05-07 ENCOUNTER — Encounter: Payer: Self-pay | Admitting: Physical Therapy

## 2017-05-07 ENCOUNTER — Ambulatory Visit: Payer: PRIVATE HEALTH INSURANCE | Admitting: Physical Therapy

## 2017-05-07 DIAGNOSIS — M25561 Pain in right knee: Secondary | ICD-10-CM

## 2017-05-07 DIAGNOSIS — M25661 Stiffness of right knee, not elsewhere classified: Secondary | ICD-10-CM

## 2017-05-07 DIAGNOSIS — M6281 Muscle weakness (generalized): Secondary | ICD-10-CM

## 2017-05-07 DIAGNOSIS — R269 Unspecified abnormalities of gait and mobility: Secondary | ICD-10-CM

## 2017-05-07 NOTE — Therapy (Signed)
[facility] [address] [place], [place], [address] Phone: [phone]   Fax:  [phone]  Physical Therapy Treatment  Patient Details  Name: Brenda Sellers MRN: [id number] Date of Birth: 01/06/1981 Referring Provider: Gean Birchwood, MD  Encounter Date: 05/07/2017      PT End of Session - 05/07/17 0945    Visit Number 6   Number of Visits 8   Date for PT Re-Evaluation 05/13/17   PT Start Time 0945   PT Stop Time 1026   PT Time Calculation (min) 41 min   Activity Tolerance Patient tolerated treatment well;Patient limited by pain   Behavior During Therapy [facility] for tasks assessed/performed      Past Medical History:  Diagnosis Date  . Anxiety   . Complication of anesthesia   . Nerve pain   . Pericardial cyst   . PONV (postoperative nausea and vomiting)    Last surgery 06/2014- nausea not vomitting was pre medicated.    Past Surgical History:  Procedure Laterality Date  . ABDOMINAL HYSTERECTOMY    . ANTERIOR CRUCIATE LIGAMENT REPAIR Left 1998   Menicus, PCL Screw  . CHOLECYSTECTOMY    . CHONDROPLASTY Right 04/10/2017   Procedure: CHONDROPLASTY;  Surgeon: Gean Birchwood, MD;  Location: [facility];  Service: Orthopedics;  Laterality: Right;  . KNEE ARTHROSCOPY Right    Menicus  . KNEE ARTHROSCOPY Right 04/10/2017   Procedure: ARTHROSCOPY KNEE;  Surgeon: Gean Birchwood, MD;  Location: [facility];  Service: Orthopedics;  Laterality: Right;  . KNEE ARTHROSCOPY WITH LATERAL MENISECTOMY Right 04/10/2017   Procedure: KNEE ARTHROSCOPY WITH  PARTIAL LATERAL MENISECTOMY;  Surgeon: Gean Birchwood, MD;  Location: [facility];  Service: Orthopedics;  Laterality: Right;  . LAPAROSCOPIC TOTAL HYSTERECTOMY  07/04/14  . ORIF ELBOW FRACTURE Left 04/04/2015   Procedure: OPEN REDUCTION INTERNAL FIXATION (ORIF) LEFT ELBOW/OLECRANON FRACTURE;  Surgeon: Bradly Bienenstock, MD;  [facility];  Service: Orthopedics;   Laterality: Left;  . TONSILLECTOMY      There were no vitals filed for this visit.      Subjective Assessment - 05/07/17 0948    Subjective Pt reports her R knee feels very tight and swollen today.  She says if she turns to her L or if she bends her knee she inconsistently has a sharp pain in her R anterior knee.  She iced her knee last night which did not seem to help much.  Pt reports the pain will wake her up if she turns in bed.   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  Pt. kicked in R knee by pt. resulting in meniscal tear/ pain.  Pt. was initially immobilized and told to rest but pain never got better.     Limitations Standing;Walking;House hold activities;Lifting   Diagnostic tests MRI   Patient Stated Goals Increase R knee ROM/ strength to improve pain-free mobility/ return to work without limitaitons.     Currently in Pain? Yes   Pain Score 10-Worst pain ever  when turning to the L. 7/10 achiness when sitting still   Pain Location Knee   Pain Orientation Right   Pain Descriptors / Indicators Aching;Sharp   Pain Type Chronic pain   Pain Onset More than a month ago   Multiple Pain Sites No      TREATMENT   Manual Therapy:  Patellar mobs grade I-II inf/sup/med/lat 2x20 sec each direction.  ?  R  prox. Tibia AP grade I-II mobs. 4x30 seconds.   PROM R knee into flexion and extension x15 each direction?   Supine R hamstring and R DF stretch x30 seconds  ?  STM to distal R quad ("feels good").     Therapeutic Exercise: ?  Seated R knee flexion and extension AROM x15 each direction ?  Supine RLE bicycles 10x3   R supine SLR 10x3   BLE bridging with GTB 10x3   Standing prostretch for R gastroc stretch 30 sec x 2.   Standing R TKE with GTB 2x15     Ice to R knee x15 minutes following session                             PT Education - 05/07/17 0945    Education provided Yes   Education Details Exercise  technique; encouraged pt to ice R knee as able at work and again when she returns home from work   [employer]) Educated Patient   Methods Explanation;Demonstration;Verbal cues   Comprehension Verbalized understanding;Returned demonstration;Verbal cues required;Need further instruction             PT Long Term Goals - 04/21/17 1258      PT LONG TERM GOAL #1   Title Pt. independent with HEP to increase R knee AROM to WNL as compared to L knee to improve pain-free mobility.   Baseline R knee -2 to 113 deg. (pain limited).     Time 4   Period Weeks   Status New     PT LONG TERM GOAL #2   Title Pt. will increase LEFS to >50 out of 80 to improve pain-free mobility/ normalized gait pattern.     Baseline LEFS: 6 out of 80.     Time 4   Period Weeks   Status New     PT LONG TERM GOAL #3   Title Pt. able to ambulate with normalized gait pattern and no assistive device on level surfaces safely.     Baseline R antalgic gait pattern with use of SPC   Time 4   Period Weeks   Status New     PT LONG TERM GOAL #4   Title Pt. able to ascend/descend 10 steps with recip. pattern and no R knee pain to improve functional mobility.     Baseline Ascends stairs with recip. pattern and descends with step to pattern.    Time 4   Period Weeks   Status New     PT LONG TERM GOAL #5   Title Pt. able to return to work with no R knee pain/limitations.     Baseline pt. currently out of work   Time 4   Period Weeks   Status New               Plan - 05/07/17 1002    Clinical Impression Statement Pt presents with increased R knee swelling and tightness which responded well to passive and active R knee ROM and gentle grade I-II mobilizations to R patella and tibia.  Pt reports improvement in R knee tightness at end of session. Encouraged pt to take seated rest breaks and ice R knee at work as able and to ice again when she returns home from work.  Pt will benefit from continued skilled PT  interventions for decreased swelling and pain for improved QOL and functional use of RLE.    Rehab Potential Excellent  PT Frequency 2x / week   PT Duration 4 weeks   PT Treatment/Interventions ADLs/Self Care Home Management;Cryotherapy;Moist Heat;Neuromuscular re-education;Gait training;Stair training;Functional mobility training;Therapeutic activities;Therapeutic exercise;Balance training;Patient/family education;Manual techniques;Passive range of motion   PT Next Visit Plan Increase R quad/ LE muscle strength/ pain mgmt.     PT Home Exercise Plan see HEP   Consulted and Agree with Plan of Care Patient      Patient will benefit from skilled therapeutic intervention in order to improve the following deficits and impairments:  Abnormal gait, Improper body mechanics, Pain, Decreased mobility, Decreased activity tolerance, Decreased endurance, Decreased range of motion, Decreased strength, Hypomobility, Impaired flexibility, Decreased balance, Difficulty walking, Increased edema  Visit Diagnosis: Acute pain of right knee  Gait difficulty  Muscle weakness (generalized)  Joint stiffness of knee, right     Problem List Patient Active Problem List   Diagnosis Date Noted  . Left radial head fracture 04/04/2015    Encarnacion ChuAshley Abashian PT, DPT 05/07/2017, 10:27 AM  [facility] [address] [place], [place], [address] Phone: [phone]   Fax:  [phone]  Name: Brenda Sellers MRN: [id number] Date of Birth: October 18, 1981

## 2017-05-11 ENCOUNTER — Encounter: Payer: Self-pay | Admitting: Physical Therapy

## 2017-05-12 ENCOUNTER — Other Ambulatory Visit (HOSPITAL_COMMUNITY): Payer: Self-pay | Admitting: Orthopedic Surgery

## 2017-05-12 DIAGNOSIS — M7989 Other specified soft tissue disorders: Principal | ICD-10-CM

## 2017-05-12 DIAGNOSIS — M79661 Pain in right lower leg: Secondary | ICD-10-CM

## 2017-05-13 ENCOUNTER — Encounter: Payer: Self-pay | Admitting: Physical Therapy

## 2017-05-13 ENCOUNTER — Encounter (INDEPENDENT_AMBULATORY_CARE_PROVIDER_SITE_OTHER): Payer: Self-pay

## 2017-05-13 ENCOUNTER — Ambulatory Visit (HOSPITAL_COMMUNITY)
Admission: RE | Admit: 2017-05-13 | Discharge: 2017-05-13 | Disposition: A | Discharge status: Home / Self Care | Payer: PRIVATE HEALTH INSURANCE | Source: Ambulatory Visit | Attending: Internal Medicine | Admitting: Internal Medicine

## 2017-05-13 DIAGNOSIS — M79661 Pain in right lower leg: Secondary | ICD-10-CM | POA: Diagnosis not present

## 2017-05-13 DIAGNOSIS — M7989 Other specified soft tissue disorders: Secondary | ICD-10-CM | POA: Insufficient documentation

## 2017-05-13 NOTE — Progress Notes (Signed)
**  Preliminary report by tech**  Right lower extremity venous duplex complete. There is no evidence of deep or superficial vein thrombosis involving the right lower extremity. All visualized vessels appear patent and compressible. There is no evidence of a Baker's cyst on the right. Results were given to Darl PikesSusan at Dr. Wadie Lessenowan's office.  05/13/17 8:54 AM Olen CordialGreg Lorie Melichar RVT

## 2017-05-14 ENCOUNTER — Ambulatory Visit: Payer: PRIVATE HEALTH INSURANCE | Attending: Orthopedic Surgery | Admitting: Physical Therapy

## 2017-05-14 DIAGNOSIS — M6281 Muscle weakness (generalized): Secondary | ICD-10-CM | POA: Insufficient documentation

## 2017-05-14 DIAGNOSIS — M25661 Stiffness of right knee, not elsewhere classified: Secondary | ICD-10-CM | POA: Insufficient documentation

## 2017-05-14 DIAGNOSIS — M25561 Pain in right knee: Secondary | ICD-10-CM | POA: Diagnosis not present

## 2017-05-14 DIAGNOSIS — R269 Unspecified abnormalities of gait and mobility: Secondary | ICD-10-CM | POA: Insufficient documentation

## 2017-05-15 NOTE — Therapy (Signed)
[facility] [address] [place], [place], [address] Phone: [phone]   Fax:  [phone]  Physical Therapy Treatment  Patient Details  Name: Brenda Sellers MRN: [id number] Date of Birth: 1981-03-02 Referring Provider: Gean Birchwood, MD  Encounter Date: 05/14/2017      PT End of Session - 05/15/17 1501    Visit Number 7   Number of Visits 15   Date for PT Re-Evaluation 06/11/17   PT Start Time 1653   PT Stop Time 1747   PT Time Calculation (min) 54 min   Activity Tolerance Patient tolerated treatment well;Patient limited by pain   Behavior During Therapy [facility] for tasks assessed/performed      Past Medical History:  Diagnosis Date  . Anxiety   . Complication of anesthesia   . Nerve pain   . Pericardial cyst   . PONV (postoperative nausea and vomiting)    Last surgery 06/2014- nausea not vomitting was pre medicated.    Past Surgical History:  Procedure Laterality Date  . ABDOMINAL HYSTERECTOMY    . ANTERIOR CRUCIATE LIGAMENT REPAIR Left 1998   Menicus, PCL Screw  . CHOLECYSTECTOMY    . CHONDROPLASTY Right 04/10/2017   Procedure: CHONDROPLASTY;  Surgeon: Gean Birchwood, MD;  Location: [facility];  Service: Orthopedics;  Laterality: Right;  . KNEE ARTHROSCOPY Right    Menicus  . KNEE ARTHROSCOPY Right 04/10/2017   Procedure: ARTHROSCOPY KNEE;  Surgeon: Gean Birchwood, MD;  Location: [facility];  Service: Orthopedics;  Laterality: Right;  . KNEE ARTHROSCOPY WITH LATERAL MENISECTOMY Right 04/10/2017   Procedure: KNEE ARTHROSCOPY WITH  PARTIAL LATERAL MENISECTOMY;  Surgeon: Gean Birchwood, MD;  Location: [facility];  Service: Orthopedics;  Laterality: Right;  . LAPAROSCOPIC TOTAL HYSTERECTOMY  07/04/14  . ORIF ELBOW FRACTURE Left 04/04/2015   Procedure: OPEN REDUCTION INTERNAL FIXATION (ORIF) LEFT ELBOW/OLECRANON FRACTURE;  Surgeon: Bradly Bienenstock, MD;  [facility];  Service: Orthopedics;   Laterality: Left;  . TONSILLECTOMY      There were no vitals filed for this visit.      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  Pt. kicked in R knee by pt. resulting in meniscal tear/ pain.  Pt. was initially immobilized and told to rest but pain never got better.     Limitations Standing;Walking;House hold activities;Lifting   Diagnostic tests MRI   Patient Stated Goals Increase R knee ROM/ strength to improve pain-free mobility/ return to work without limitaitons.     Currently in Pain? Yes   Pain Score 3    Pain Orientation Right   Pain Descriptors / Indicators Aching                                      PT Long Term Goals - 04/21/17 1258      PT LONG TERM GOAL #1   Title Pt. independent with HEP to increase R knee AROM to WNL as compared to L knee to improve pain-free mobility.   Baseline R knee -2 to 113 deg. (pain limited).     Time 4   Period Weeks   Status New     PT LONG TERM GOAL #2   Title Pt. will increase LEFS to >50 out of 80 to improve pain-free mobility/ normalized gait pattern.  Baseline LEFS: 6 out of 80.     Time 4   Period Weeks   Status New     PT LONG TERM GOAL #3   Title Pt. able to ambulate with normalized gait pattern and no assistive device on level surfaces safely.     Baseline R antalgic gait pattern with use of SPC   Time 4   Period Weeks   Status New     PT LONG TERM GOAL #4   Title Pt. able to ascend/descend 10 steps with recip. pattern and no R knee pain to improve functional mobility.     Baseline Ascends stairs with recip. pattern and descends with step to pattern.    Time 4   Period Weeks   Status New     PT LONG TERM GOAL #5   Title Pt. able to return to work with no R knee pain/limitations.     Baseline pt. currently out of work   Time 4   Period Weeks   Status New               Plan - 05/15/17 1503    Clinical  Presentation Stable   Clinical Decision Making Moderate   PT Frequency 2x / week   PT Duration 4 weeks   PT Treatment/Interventions ADLs/Self Care Home Management;Cryotherapy;Moist Heat;Neuromuscular re-education;Gait training;Stair training;Functional mobility training;Therapeutic activities;Therapeutic exercise;Balance training;Patient/family education;Manual techniques;Passive range of motion   PT Next Visit Plan Increase R quad/ LE muscle strength/ pain mgmt.     PT Home Exercise Plan see HEP   Consulted and Agree with Plan of Care Patient      Patient will benefit from skilled therapeutic intervention in order to improve the following deficits and impairments:  Abnormal gait, Improper body mechanics, Pain, Decreased mobility, Decreased activity tolerance, Decreased endurance, Decreased range of motion, Decreased strength, Hypomobility, Impaired flexibility, Decreased balance, Difficulty walking, Increased edema  Visit Diagnosis: Acute pain of right knee  Gait difficulty  Muscle weakness (generalized)  Joint stiffness of knee, right     Problem List Patient Active Problem List   Diagnosis Date Noted  . Left radial head fracture 04/04/2015    Cammie McgeeSherk, Michael C 05/15/2017, 3:04 PM  [facility] [address] [place], [place], [address] Phone: [phone]   Fax:  [phone]  Name: Brenda Sellers MRN: [id number] Date of Birth: [birth year]

## 2017-05-18 ENCOUNTER — Ambulatory Visit: Payer: PRIVATE HEALTH INSURANCE | Admitting: Physical Therapy

## 2017-05-18 ENCOUNTER — Encounter: Payer: Self-pay | Admitting: Physical Therapy

## 2017-05-18 DIAGNOSIS — M25661 Stiffness of right knee, not elsewhere classified: Secondary | ICD-10-CM | POA: Diagnosis not present

## 2017-05-18 DIAGNOSIS — M25561 Pain in right knee: Secondary | ICD-10-CM | POA: Diagnosis not present

## 2017-05-18 DIAGNOSIS — M6281 Muscle weakness (generalized): Secondary | ICD-10-CM

## 2017-05-18 DIAGNOSIS — R269 Unspecified abnormalities of gait and mobility: Secondary | ICD-10-CM

## 2017-05-19 ENCOUNTER — Encounter: Payer: Self-pay | Admitting: Physical Therapy

## 2017-05-19 NOTE — Therapy (Signed)
[place] [facility] [address] [place], [place], [address] Phone: [phone]   Fax:  [phone]  Physical Therapy Treatment  Patient Details  Name: Brenda SiStacy I Sellers MRN: [id number] Date of Birth: January 23, 1981 Referring Provider: Gean BirchwoodFrank Rowan, MD  Encounter Date: 05/18/2017      PT End of Session - 05/19/17 1712    Visit Number 8   Number of Visits 15   Date for PT Re-Evaluation 06/11/17   PT Start Time 1516   PT Stop Time 1629   PT Time Calculation (min) 73 min   Activity Tolerance Patient tolerated treatment well;Patient limited by pain   Behavior [facility] for tasks assessed/performed      Past Medical History:  Diagnosis Date  . Anxiety   . Complication of anesthesia   . Nerve pain   . Pericardial cyst   . PONV (postoperative nausea and vomiting)    Last surgery 06/2014- nausea not vomitting was pre medicated.    Past Surgical History:  Procedure Laterality Date  . ABDOMINAL HYSTERECTOMY    . ANTERIOR CRUCIATE LIGAMENT REPAIR Left 1998   Menicus, PCL Screw  . CHOLECYSTECTOMY    . CHONDROPLASTY Right 04/10/2017   Procedure: CHONDROPLASTY;  Surgeon: Gean Birchwoodowan, Frank, MD;  Location: [facility];  Service: Orthopedics;  Laterality: Right;  . KNEE ARTHROSCOPY Right    Menicus  . KNEE ARTHROSCOPY Right 04/10/2017   Procedure: ARTHROSCOPY KNEE;  Surgeon: Gean Birchwoodowan, Frank, MD;  Location: [facility];  Service: Orthopedics;  Laterality: Right;  . KNEE ARTHROSCOPY WITH LATERAL MENISECTOMY Right 04/10/2017   Procedure: KNEE ARTHROSCOPY WITH  PARTIAL LATERAL MENISECTOMY;  Surgeon: Gean Birchwoodowan, Frank, MD;  Location: [facility];  Service: Orthopedics;  Laterality: Right;  . LAPAROSCOPIC TOTAL HYSTERECTOMY  07/04/14  . ORIF ELBOW FRACTURE Left 04/04/2015   Procedure: OPEN REDUCTION INTERNAL FIXATION (ORIF) LEFT ELBOW/OLECRANON FRACTURE;  Surgeon: Bradly BienenstockFred Ortmann, MD;  [facility];  Service: Orthopedics;   Laterality: Left;  . TONSILLECTOMY      There were no vitals filed for this visit.      Subjective Assessment - 05/18/17 1558    Subjective Pt. reports R knee "stiffness" and 5/10 pain currently.  7/10 pain at worst.  [place] and returns to work on Thursday.     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  Pt. kicked in R knee by pt. resulting in meniscal tear/ pain.  Pt. was initially immobilized and told to rest but pain never got better.     Limitations Standing;Walking;House hold activities;Lifting   Diagnostic tests MRI   Patient Stated Goals Increase R knee ROM/ strength to improve pain-free mobility/ return to work without limitaitons.     Currently in Pain? Yes   Pain Score 5    Pain Location Knee     (-) DVT reported at MD office.  Improved swelling in R knee/lower leg today.      Manual Therapy:  Patellar mobs grade I-II inf/sup/med/lat 2x20 sec each direction.  ?  R prox. Tibia AP grade I-II mobs. 4x30 seconds.   PROM R knee into flexion and extension x15 each direction?   Supine R hamstring and R gastroc stretch x30 seconds  ?  STM to distal R quad./hamstring/ gastroc    Therapeutic Exercise: ?  Recip. Stair climbing with no UE assist 10x  Seated R knee flexion and extension  AROM x10 each direction (static holds)  Eccentric step downs with 3"/6" plinths 10x2. ?  Supine RLE bicycles/ SLR/ bridging 20x each.  BLE bridging with GTB 10x3   Standing hip abd./ lateral steps in //-bars with upright posture  BOSU step ups (forward/ lunges) 10x2 each.    TG knee flexion (toe in/out/ midline) 15x each.   Heel raises 20x   Will ice R knee at home          PT Long Term Goals - 04/21/17 1258      PT LONG TERM GOAL #1   Title Pt. independent with HEP to increase R knee AROM to WNL as compared to L knee to improve pain-free mobility.   Baseline R knee -2 to 113 deg. (pain  limited).     Time 4   Period Weeks   Status New     PT LONG TERM GOAL #2   Title Pt. will increase LEFS to >50 out of 80 to improve pain-free mobility/ normalized gait pattern.     Baseline LEFS: 6 out of 80.     Time 4   Period Weeks   Status New     PT LONG TERM GOAL #3   Title Pt. able to ambulate with normalized gait pattern and no assistive device on level surfaces safely.     Baseline R antalgic gait pattern with use of SPC   Time 4   Period Weeks   Status New     PT LONG TERM GOAL #4   Title Pt. able to ascend/descend 10 steps with recip. pattern and no R knee pain to improve functional mobility.     Baseline Ascends stairs with recip. pattern and descends with step to pattern.    Time 4   Period Weeks   Status New     PT LONG TERM GOAL #5   Title Pt. able to return to work with no R knee pain/limitations.     Baseline pt. currently out of work   Time 4   Period Weeks   Status New             Plan - 05/19/17 1713    Clinical Impression Statement Moderate R knee/ LE muscle fatigue and soreness after ther.ex.  Increase knee flexion with VMO/VLO focus on TG today.  Pt. completed all ex. without use of knee brace.  Ascending stairs with recip. pattern and no UE assist safely.     Clinical Presentation Stable   Clinical Decision Making Moderate   Rehab Potential Excellent   PT Frequency 2x / week   PT Duration 4 weeks   PT Treatment/Interventions ADLs/Self Care Home Management;Cryotherapy;Moist Heat;Neuromuscular re-education;Gait training;Stair training;Functional mobility training;Therapeutic activities;Therapeutic exercise;Balance training;Patient/family education;Manual techniques;Passive range of motion   PT Next Visit Plan Increase R quad/ LE muscle strength/ pain mgmt.  Discuss fishing trip.  Check stability of knee brace.     PT Home Exercise Plan see HEP   Consulted and Agree with Plan of Care Patient      Patient will benefit from skilled therapeutic  intervention in order to improve the following deficits and impairments:  Abnormal gait, Improper body mechanics, Pain, Decreased mobility, Decreased activity tolerance, Decreased endurance, Decreased range of motion, Decreased strength, Hypomobility, Impaired flexibility, Decreased balance, Difficulty walking, Increased edema  Visit Diagnosis: Acute pain of right knee  Gait difficulty  Muscle weakness (generalized)  Joint stiffness of knee, right     Problem List Patient Active Problem List  Diagnosis Date Noted  . Left radial head fracture 04/04/2015   Cammie Mcgee, PT, DPT # [phone] 05/19/2017, 5:17 PM  [place] [facility] [address] [place], [place], [address] Phone: [phone]   Fax:  [phone]  Name: Brenda Sellers MRN: [id number] Date of Birth: 1981/09/14

## 2017-05-21 ENCOUNTER — Encounter: Payer: Self-pay | Admitting: Physical Therapy

## 2017-05-28 ENCOUNTER — Encounter: Payer: Self-pay | Admitting: Physical Therapy

## 2017-05-28 ENCOUNTER — Ambulatory Visit: Payer: PRIVATE HEALTH INSURANCE | Attending: Orthopedic Surgery | Admitting: Physical Therapy

## 2017-05-28 DIAGNOSIS — M25561 Pain in right knee: Secondary | ICD-10-CM | POA: Diagnosis present

## 2017-05-28 DIAGNOSIS — M6281 Muscle weakness (generalized): Secondary | ICD-10-CM | POA: Insufficient documentation

## 2017-05-28 DIAGNOSIS — M25661 Stiffness of right knee, not elsewhere classified: Secondary | ICD-10-CM

## 2017-05-28 DIAGNOSIS — R2689 Other abnormalities of gait and mobility: Secondary | ICD-10-CM | POA: Diagnosis present

## 2017-05-28 DIAGNOSIS — R269 Unspecified abnormalities of gait and mobility: Secondary | ICD-10-CM | POA: Insufficient documentation

## 2017-05-28 DIAGNOSIS — G8929 Other chronic pain: Secondary | ICD-10-CM | POA: Diagnosis present

## 2017-05-29 NOTE — Therapy (Signed)
[place] [facility] [address] [place], [place], [address] Phone: [phone]   Fax:  [phone]  Physical Therapy Treatment  Patient Details  Name: Brenda Sellers MRN: [id number] Date of Birth: 16-Jan-1981 Referring Provider: Gean Birchwood, MD  Encounter Date: 05/28/2017      PT End of Session - 05/28/17 1542    Visit Number 9   Number of Visits 15   Date for PT Re-Evaluation 06/11/17   PT Start Time 1446   PT Stop Time 1544   PT Time Calculation (min) 58 min   Activity Tolerance Patient tolerated treatment well;Patient limited by pain   Behavior During Therapy [facility] for tasks assessed/performed      Past Medical History:  Diagnosis Date  . Anxiety   . Complication of anesthesia   . Nerve pain   . Pericardial cyst   . PONV (postoperative nausea and vomiting)    Last surgery 06/2014- nausea not vomitting was pre medicated.    Past Surgical History:  Procedure Laterality Date  . ABDOMINAL HYSTERECTOMY    . ANTERIOR CRUCIATE LIGAMENT REPAIR Left 1998   Menicus, PCL Screw  . CHOLECYSTECTOMY    . CHONDROPLASTY Right 04/10/2017   Procedure: CHONDROPLASTY;  Surgeon: Gean Birchwood, MD;  Location: [facility];  Service: Orthopedics;  Laterality: Right;  . KNEE ARTHROSCOPY Right    Menicus  . KNEE ARTHROSCOPY Right 04/10/2017   Procedure: ARTHROSCOPY KNEE;  Surgeon: Gean Birchwood, MD;  Location: [facility];  Service: Orthopedics;  Laterality: Right;  . KNEE ARTHROSCOPY WITH LATERAL MENISECTOMY Right 04/10/2017   Procedure: KNEE ARTHROSCOPY WITH  PARTIAL LATERAL MENISECTOMY;  Surgeon: Gean Birchwood, MD;  Location:  [facility];  Service: Orthopedics;  Laterality: Right;  . LAPAROSCOPIC TOTAL HYSTERECTOMY  07/04/14  . ORIF ELBOW FRACTURE Left 04/04/2015   Procedure: OPEN REDUCTION INTERNAL FIXATION (ORIF) LEFT ELBOW/OLECRANON FRACTURE;  Surgeon: Bradly Bienenstock, MD;  [facility];  Service: Orthopedics;   Laterality: Left;  . TONSILLECTOMY      There were no vitals filed for this visit.      Subjective Assessment - 05/28/17 1536    Subjective Pt. reports R knee pain is 4/10 currently and it feels "a little achey." Pt. states her R [place] reports she did not do that much activity and was sitting off and on to rest. Pt reports her next MD appointment is on 7/10.    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  Pt. kicked in R knee by pt. resulting in meniscal tear/ pain.  Pt. was initially immobilized and told to rest but pain never got better.     Limitations Standing;Walking;House hold activities;Lifting   Diagnostic tests MRI   Patient Stated Goals Increase R knee ROM/ strength to improve pain-free mobility/ return to work without limitaitons.     Currently in Pain? Yes   Pain Score 4    Pain Location Knee   Pain Orientation Right   Pain Descriptors / Indicators Aching   Pain Type Chronic pain   Pain Onset More than a month ago   Pain Frequency Constant   Multiple Pain Sites No                                 PT Education - 05/28/17 1541  Education provided Yes   Education Details Pt educated on continuance of HEP and proper gait mechanics   Person(s) Educated Patient   Methods Explanation;Verbal cues   Comprehension Verbalized understanding;Returned demonstration             PT Long Term Goals - 04/21/17 1258      PT LONG TERM GOAL #1   Title Pt. independent with HEP to increase R knee AROM to WNL as compared to L knee to improve pain-free mobility.   Baseline R knee -2 to 113 deg. (pain limited).     Time 4   Period Weeks   Status New     PT LONG TERM GOAL #2   Title Pt. will increase LEFS to >50 out of 80 to improve pain-free mobility/ normalized gait pattern.     Baseline LEFS: 6 out of 80.     Time 4   Period Weeks   Status New     PT LONG TERM GOAL  #3   Title Pt. able to ambulate with normalized gait pattern and no assistive device on level surfaces safely.     Baseline R antalgic gait pattern with use of SPC   Time 4   Period Weeks   Status New     PT LONG TERM GOAL #4   Title Pt. able to ascend/descend 10 steps with recip. pattern and no R knee pain to improve functional mobility.     Baseline Ascends stairs with recip. pattern and descends with step to pattern.    Time 4   Period Weeks   Status New     PT LONG TERM GOAL #5   Title Pt. able to return to work with no R knee pain/limitations.     Baseline pt. currently out of work   Time 4   Period Weeks   Status New               Plan - 05/28/17 1546    Clinical Impression Statement Pt presents with R knee pain and decreased LE strength. Pt. reported 7/10 pain during step ups with RLE and required rest break before moving on to a different exercise. Pt. tolerated manual therapy well with an increase in knee flexion ROM and decrease in knee pain afterward. Pt. will continue to benefit from skilled PT in order to decrease R knee pain, increase ROM and strength in order to return to full activity at work and in the community.   Clinical Presentation Stable   Clinical Decision Making Moderate   Rehab Potential Excellent   PT Frequency 2x / week   PT Duration 4 weeks   PT Treatment/Interventions ADLs/Self Care Home Management;Cryotherapy;Moist Heat;Neuromuscular re-education;Gait training;Stair training;Functional mobility training;Therapeutic activities;Therapeutic exercise;Balance training;Patient/family education;Manual techniques;Passive range of motion   PT Next Visit Plan Increase R quad/ LE muscle strength/ pain mgmt.  Discuss fishing trip.  Check stability of knee brace.     PT Home Exercise Plan see HEP   Consulted and Agree with Plan of Care Patient      Patient will benefit from skilled therapeutic intervention in order to improve the following deficits and  impairments:  Abnormal gait, Improper body mechanics, Pain, Decreased mobility, Decreased activity tolerance, Decreased endurance, Decreased range of motion, Decreased strength, Hypomobility, Impaired flexibility, Decreased balance, Difficulty walking, Increased edema  Visit Diagnosis: Chronic pain of right knee  Other abnormalities of gait and mobility  Stiffness of right knee, not elsewhere classified     Problem List Patient  Active Problem List   Diagnosis Date Noted  . Left radial head fracture 04/04/2015    Brenda Sellers, Brenda Sellers 05/29/2017, 3:44 PM  [facility] [address] [place], [place], [address] Phone: [phone]   Fax:  [phone]  Name: Brenda Sellers MRN: [id number] Date of Birth: 03-19-1981

## 2017-06-02 ENCOUNTER — Ambulatory Visit: Payer: PRIVATE HEALTH INSURANCE | Admitting: Physical Therapy

## 2017-06-02 ENCOUNTER — Other Ambulatory Visit: Payer: Self-pay | Admitting: Orthopedic Surgery

## 2017-06-02 DIAGNOSIS — M25561 Pain in right knee: Secondary | ICD-10-CM

## 2017-06-04 ENCOUNTER — Ambulatory Visit: Payer: PRIVATE HEALTH INSURANCE | Admitting: Physical Therapy

## 2017-06-04 ENCOUNTER — Encounter: Payer: Self-pay | Admitting: Physical Therapy

## 2017-06-04 DIAGNOSIS — M6281 Muscle weakness (generalized): Secondary | ICD-10-CM

## 2017-06-04 DIAGNOSIS — M25561 Pain in right knee: Secondary | ICD-10-CM | POA: Diagnosis not present

## 2017-06-04 DIAGNOSIS — R269 Unspecified abnormalities of gait and mobility: Secondary | ICD-10-CM

## 2017-06-04 DIAGNOSIS — R2689 Other abnormalities of gait and mobility: Secondary | ICD-10-CM

## 2017-06-04 DIAGNOSIS — M25661 Stiffness of right knee, not elsewhere classified: Secondary | ICD-10-CM

## 2017-06-04 DIAGNOSIS — G8929 Other chronic pain: Secondary | ICD-10-CM

## 2017-06-04 NOTE — Therapy (Signed)
[facility] [facility] [address] [place], [place], [address] Phone: [phone]   Fax:  [phone]  Physical Therapy Treatment  Patient Details  Name: Brenda Sellers MRN: [id number] Date of Birth: 09/28/1981 Referring Provider: Gean Birchwood, MD  Encounter Date: 06/04/2017      PT End of Session - 06/04/17 1738    Visit Number 10   Number of Visits 15   Date for PT Re-Evaluation 06/11/17   PT Start Time 1601   PT Stop Time 1653   PT Time Calculation (min) 52 min   Activity Tolerance Patient tolerated treatment well;Patient limited by pain   Behavior During Therapy [facility] for tasks assessed/performed      Past Medical History:  Diagnosis Date  . Anxiety   . Complication of anesthesia   . Nerve pain   . Pericardial cyst   . PONV (postoperative nausea and vomiting)    Last surgery 06/2014- nausea not vomitting was pre medicated.    Past Surgical History:  Procedure Laterality Date  . ABDOMINAL HYSTERECTOMY    . ANTERIOR CRUCIATE LIGAMENT REPAIR Left 1998   Menicus, PCL Screw  . CHOLECYSTECTOMY    . CHONDROPLASTY Right 04/10/2017   Procedure: CHONDROPLASTY;  Surgeon: Gean Birchwood, MD;  Location: [facility];  Service: Orthopedics;  Laterality: Right;  . KNEE ARTHROSCOPY Right    Menicus  . KNEE ARTHROSCOPY Right 04/10/2017   Procedure: ARTHROSCOPY KNEE;  Surgeon: Gean Birchwood, MD;  Location: [facility];  Service: Orthopedics;  Laterality: Right;  . KNEE ARTHROSCOPY WITH LATERAL MENISECTOMY Right 04/10/2017   Procedure: KNEE ARTHROSCOPY WITH  PARTIAL LATERAL MENISECTOMY;  Surgeon: Gean Birchwood, MD;  Location:  S[facility]ERY [facility];  Service: Orthopedics;  Laterality: Right;  . LAPAROSCOPIC TOTAL HYSTERECTOMY  07/04/14  . ORIF ELBOW FRACTURE Left 04/04/2015   Procedure: OPEN REDUCTION INTERNAL FIXATION (ORIF) LEFT ELBOW/OLECRANON FRACTURE;  Surgeon: Bradly Bienenstock, MD;  [facility];  Service: Orthopedics;   Laterality: Left;  . TONSILLECTOMY      There were no vitals filed for this visit.      Subjective Assessment - 06/04/17 1730    Subjective Pt. reports R knee pain is 7/10 and required the use of a cane for ambulation yesterday. Pt. reports MD appt was "okay" and he wants her to have another MRI on 7/26 to rule out any pathology of mensicus or LCL. Pt. reports she was upset about this news and really does not want another surgery.   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  Pt. kicked in R knee by pt. resulting in meniscal tear/ pain.  Pt. was initially immobilized and told to rest but pain never got better.     Limitations Standing;Walking;House hold activities;Lifting   Diagnostic tests MRI   Patient Stated Goals Increase R knee ROM/ strength to improve pain-free mobility/ return to work without limitaitons.     Currently in Pain? Yes   Pain Score 7    Pain Location Knee   Pain Orientation Right   Pain Descriptors / Indicators Aching   Pain Type Chronic pain   Pain Onset More than a month ago   Pain Frequency Constant   Multiple Pain Sites No     Treatment: Passive knee flexion in supine x10 STM to R lateral knee along LCL and lateral meniscus x29min Passive knee flexion in supine Ice massage to R lateral knee x48min  TherEx: Contract relax knee  flexion in supine x2. Pt. Could not tolerate and reported 10/10 knee pain along R lateral knee Eccentric step downs x5. Pt. Could not tolerate secondary to pain Ambulation [id number]       PT Education - 06/04/17 1738    Education provided Yes   Education Details ice massage to lateral knee to decrease swelling   Person(s) Educated Patient   Methods Explanation   Comprehension Verbalized understanding             PT Long Term Goals - 04/21/17 1258      PT LONG TERM GOAL #1   Title Pt. independent with HEP to increase R knee AROM to WNL as compared to L knee to improve pain-free mobility.    Baseline R knee -2 to 113 deg. (pain limited).     Time 4   Period Weeks   Status New     PT LONG TERM GOAL #2   Title Pt. will increase LEFS to >50 out of 80 to improve pain-free mobility/ normalized gait pattern.     Baseline LEFS: 6 out of 80.     Time 4   Period Weeks   Status New     PT LONG TERM GOAL #3   Title Pt. able to ambulate with normalized gait pattern and no assistive device on level surfaces safely.     Baseline R antalgic gait pattern with use of SPC   Time 4   Period Weeks   Status New     PT LONG TERM GOAL #4   Title Pt. able to ascend/descend 10 steps with recip. pattern and no R knee pain to improve functional mobility.     Baseline Ascends stairs with recip. pattern and descends with step to pattern.    Time 4   Period Weeks   Status New     PT LONG TERM GOAL #5   Title Pt. able to return to work with no R knee pain/limitations.     Baseline pt. currently out of work   Time 4   Period Weeks   Status New               Plan - 06/04/17 1743    Clinical Impression Statement Pt reports to PT with moderate to severe right knee pain and ambulates with an antalgic gait secondary to pain. During ambulation, pt. will suddenly have severe pain in R knee requiring her to stop walking for a few minutes. She then recovers and can walk again, but with antalgic gait. Pt. reports tenderness to palpation of R lateral knee along LCL and lateral meniscus. PT attempted contract relax technique to increase knee flexion, but pt could not tolerate secondary to "10/10" R lateral knee pain. Pt tolerated ice massage to R lateral knee and reported decrease in pain. Eccentric step downs on 3" beam on R leg could not be tolerated by pt due to pain and exercise was stopped. Pt. educated on ice massage to R lateral knee to decrease pain and inflammation.   Rehab Potential Excellent   PT Frequency 2x / week   PT Duration 4 weeks   PT Treatment/Interventions ADLs/Self Care Home  Management;Cryotherapy;Moist Heat;Neuromuscular re-education;Gait training;Stair training;Functional mobility training;Therapeutic activities;Therapeutic exercise;Balance training;Patient/family education;Manual techniques;Passive range of motion   PT Next Visit Plan Increase R quad/ LE muscle strength/ pain mgmt.  Discuss fishing trip.  Check stability of knee brace.     PT Home Exercise Plan see HEP   Consulted and Agree with  Plan of Care Patient      Patient will benefit from skilled therapeutic intervention in order to improve the following deficits and impairments:  Abnormal gait, Improper body mechanics, Pain, Decreased mobility, Decreased activity tolerance, Decreased endurance, Decreased range of motion, Decreased strength, Hypomobility, Impaired flexibility, Decreased balance, Difficulty walking, Increased edema  Visit Diagnosis: Chronic pain of right knee  Other abnormalities of gait and mobility  Stiffness of right knee, not elsewhere classified  Gait difficulty  Muscle weakness (generalized)  Joint stiffness of knee, right     Problem List Patient Active Problem List   Diagnosis Date Noted  . Left radial head fracture 04/04/2015   Cammie Mcgee, PT, DPT # [id number] Nickola Major, SPT 06/05/2017, 5:08 PM  [place] [facility] [address] [place], [place], [address] Phone: [phone]   Fax:  [phone]  Name: Brenda Sellers MRN: [id number] Date of Birth: [birth year]

## 2017-06-08 ENCOUNTER — Ambulatory Visit: Payer: PRIVATE HEALTH INSURANCE | Admitting: Physical Therapy

## 2017-06-08 DIAGNOSIS — R2689 Other abnormalities of gait and mobility: Secondary | ICD-10-CM

## 2017-06-08 DIAGNOSIS — M6281 Muscle weakness (generalized): Secondary | ICD-10-CM

## 2017-06-08 DIAGNOSIS — M25561 Pain in right knee: Principal | ICD-10-CM

## 2017-06-08 DIAGNOSIS — G8929 Other chronic pain: Secondary | ICD-10-CM

## 2017-06-08 DIAGNOSIS — R269 Unspecified abnormalities of gait and mobility: Secondary | ICD-10-CM

## 2017-06-08 DIAGNOSIS — M25661 Stiffness of right knee, not elsewhere classified: Secondary | ICD-10-CM

## 2017-06-08 NOTE — Therapy (Signed)
[facility] [address] [place], [place], [address] Phone: [phone]   Fax:  [phone]  Physical Therapy Treatment  Patient Details  Name: Brenda Sellers MRN: [id number] Date of Birth: [birth year] Referring Provider: Gean BirchwoodFrank Rowan, MD  Encounter Date: 06/08/2017      PT End of Session - 06/08/17 1707    Visit Number 11   Number of Visits 15   Date for PT Re-Evaluation 06/11/17   PT Start Time 1347   PT Stop Time 1445   PT Time Calculation (min) 58 min   Activity Tolerance Patient tolerated treatment well;Patient limited by pain   Behavior During Therapy [facility] for tasks assessed/performed      Past Medical History:  Diagnosis Date  . Anxiety   . Complication of anesthesia   . Nerve pain   . Pericardial cyst   . PONV (postoperative nausea and vomiting)    Last surgery 06/2014- nausea not vomitting was pre medicated.    Past Surgical History:  Procedure Laterality Date  . ABDOMINAL HYSTERECTOMY    . ANTERIOR CRUCIATE LIGAMENT REPAIR Left 1998   Menicus, PCL Screw  . CHOLECYSTECTOMY    . CHONDROPLASTY Right 04/10/2017   Procedure: CHONDROPLASTY;  Surgeon: Gean Birchwoodowan, Frank, MD;  Location: [facility];  Service: Orthopedics;  Laterality: Right;  . KNEE ARTHROSCOPY Right    Menicus  . KNEE ARTHROSCOPY Right 04/10/2017   Procedure: ARTHROSCOPY KNEE;  Surgeon: Gean Birchwoodowan, Frank, MD;  Location: [facility];  Service: Orthopedics;  Laterality: Right;  . KNEE ARTHROSCOPY WITH LATERAL MENISECTOMY Right 04/10/2017   Procedure: KNEE ARTHROSCOPY WITH  PARTIAL LATERAL MENISECTOMY;  Surgeon: Gean Birchwoodowan, Frank, MD;  Location: [facility];  Service: Orthopedics;  Laterality: Right;  . LAPAROSCOPIC TOTAL HYSTERECTOMY  07/04/14  . ORIF ELBOW FRACTURE Left 04/04/2015   Procedure: OPEN REDUCTION INTERNAL FIXATION (ORIF) LEFT ELBOW/OLECRANON FRACTURE;  Surgeon: Bradly BienenstockFred Ortmann, MD;  [facility];  Service: Orthopedics;   Laterality: Left;  . TONSILLECTOMY      There were no vitals filed for this visit.      Subjective Assessment - 06/08/17 1701    Subjective Pt. states she has been off work since Friday and knee is feeling okay today.  Pt. reports 2/10 R knee pain currently.     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  Pt. kicked in R knee by pt. resulting in meniscal tear/ pain.  Pt. was initially immobilized and told to rest but pain never got better.     Limitations Standing;Walking;House hold activities;Lifting   Diagnostic tests MRI   Patient Stated Goals Increase R knee ROM/ strength to improve pain-free mobility/ return to work without limitaitons.     Currently in Pain? Yes   Pain Score 2    Pain Location Knee   Pain Orientation Right   Pain Descriptors / Indicators Aching   Pain Type Chronic pain   Pain Onset More than a month ago   Pain Frequency Constant      Treatment:  TherEx:  Scifit L6 10 min. B LE/UE (warm-up/no charge). TG knee flexion 20x (midline/toe in/toe out)/ heel raises 20x each. BOSU R LE lunges 5 sec. Holds 15x each (no UE assist).  Step ups 15x.  BOSU wt. Shifting L/R with no UE assist.   Ambulation in clinic working on consistent step pattern/ heel strike Hallway activities: high marching/ tandem gait f/b/ braiding.  2 episodes of  R knee pain with braiding/ stepping up on BOSU ball.     Manual tx.:  Supine R prox. Tibia AP grade II-III mobs. 3 x 30 sec. Supine R knee flexion/ extension stretches 3x each. Patellar mobs. (all planes) STM to R distal quad./hamstring/ prox. gastroc region (minimal tenderness noted).    Pt. Will ice R knee when she returns home after grocery shopping       PT Long Term Goals - 04/21/17 1258      PT LONG TERM GOAL #1   Title Pt. independent with HEP to increase R knee AROM to WNL as compared to L knee to improve pain-free mobility.   Baseline R knee -2 to 113 deg. (pain limited).     Time 4    Period Weeks   Status New     PT LONG TERM GOAL #2   Title Pt. will increase LEFS to >50 out of 80 to improve pain-free mobility/ normalized gait pattern.     Baseline LEFS: 6 out of 80.     Time 4   Period Weeks   Status New     PT LONG TERM GOAL #3   Title Pt. able to ambulate with normalized gait pattern and no assistive device on level surfaces safely.     Baseline R antalgic gait pattern with use of SPC   Time 4   Period Weeks   Status New     PT LONG TERM GOAL #4   Title Pt. able to ascend/descend 10 steps with recip. pattern and no R knee pain to improve functional mobility.     Baseline Ascends stairs with recip. pattern and descends with step to pattern.    Time 4   Period Weeks   Status New     PT LONG TERM GOAL #5   Title Pt. able to return to work with no R knee pain/limitations.     Baseline pt. currently out of work   Time 4   Period Weeks   Status New            Plan - 06/08/17 1709    Clinical Impression Statement Marked improvement in gait pattern/ pain management with ther.ex. today as compared to last 2 treatement sessions.  Pt. continues to have episodes of "sharp" pain with occasional wt. bearing/ knee rotation tasks during braiding ex.  Good patellar mobs./ knee flexion but pain limited iwth medial knee stress testing.     Clinical Presentation Stable   Clinical Decision Making Moderate   Rehab Potential Excellent   PT Frequency 2x / week   PT Duration 4 weeks   PT Treatment/Interventions ADLs/Self Care Home Management;Cryotherapy;Moist Heat;Neuromuscular re-education;Gait training;Stair training;Functional mobility training;Therapeutic activities;Therapeutic exercise;Balance training;Patient/family education;Manual techniques;Passive range of motion   PT Next Visit Plan Increase R quad/ LE muscle strength/ pain mgmt.  CHECK GOALS/ RECERT NEXT TX.     PT Home Exercise Plan see HEP   Consulted and Agree with Plan of Care Patient      Patient  will benefit from skilled therapeutic intervention in order to improve the following deficits and impairments:  Abnormal gait, Improper body mechanics, Pain, Decreased mobility, Decreased activity tolerance, Decreased endurance, Decreased range of motion, Decreased strength, Hypomobility, Impaired flexibility, Decreased balance, Difficulty walking, Increased edema  Visit Diagnosis: Chronic pain of right knee  Other abnormalities of gait and mobility  Stiffness of right knee, not elsewhere classified  Gait difficulty  Muscle weakness (generalized)     Problem List Patient  Active Problem List   Diagnosis Date Noted  . Left radial head fracture 04/04/2015   Cammie Mcgee, PT, DPT # [id number] 06/08/2017, 5:34 PM  [facility] [address] [place], [place], [address] Phone: [phone]   Fax:  [phone]  Name: Brenda Sellers MRN: [id number] Date of Birth: [birth year]

## 2017-06-11 ENCOUNTER — Ambulatory Visit: Payer: PRIVATE HEALTH INSURANCE | Admitting: Physical Therapy

## 2017-06-15 ENCOUNTER — Ambulatory Visit: Payer: PRIVATE HEALTH INSURANCE | Admitting: Physical Therapy

## 2017-06-16 ENCOUNTER — Ambulatory Visit: Payer: PRIVATE HEALTH INSURANCE | Admitting: Physical Therapy

## 2017-06-17 ENCOUNTER — Ambulatory Visit: Payer: PRIVATE HEALTH INSURANCE | Admitting: Physical Therapy

## 2017-06-17 ENCOUNTER — Encounter: Payer: Self-pay | Admitting: Physical Therapy

## 2017-06-17 DIAGNOSIS — M25561 Pain in right knee: Secondary | ICD-10-CM | POA: Diagnosis not present

## 2017-06-17 DIAGNOSIS — M25661 Stiffness of right knee, not elsewhere classified: Secondary | ICD-10-CM

## 2017-06-17 DIAGNOSIS — R2689 Other abnormalities of gait and mobility: Secondary | ICD-10-CM

## 2017-06-17 DIAGNOSIS — M6281 Muscle weakness (generalized): Secondary | ICD-10-CM

## 2017-06-17 DIAGNOSIS — G8929 Other chronic pain: Secondary | ICD-10-CM

## 2017-06-17 DIAGNOSIS — R269 Unspecified abnormalities of gait and mobility: Secondary | ICD-10-CM

## 2017-06-17 NOTE — Therapy (Signed)
[place] [facility] [address] [place], [place], [address] Phone: [phone]   Fax:  [phone]  Physical Therapy Treatment  Patient Details  Name: Brenda Sellers MRN: [id number] Date of Birth: 1980/12/02 Referring Provider: Frederik Pear, MD  Encounter Date: 06/17/2017      PT End of Session - 06/17/17 1820    Visit Number 12   Number of Visits 20   Date for PT Re-Evaluation 07/15/17   PT Start Time 8250   PT Stop Time 1636   PT Time Calculation (min) 59 min   Equipment Utilized During Treatment Gait belt   Activity Tolerance Patient tolerated treatment well;Patient limited by pain   Behavior During Therapy [facility] for tasks assessed/performed      Past Medical History:  Diagnosis Date  . Anxiety   . Complication of anesthesia   . Nerve pain   . Pericardial cyst   . PONV (postoperative nausea and vomiting)    Last surgery 06/2014- nausea not vomitting was pre medicated.    Past Surgical History:  Procedure Laterality Date  . ABDOMINAL HYSTERECTOMY    . ANTERIOR CRUCIATE LIGAMENT REPAIR Left 1998   Menicus, PCL Screw  . CHOLECYSTECTOMY    . CHONDROPLASTY Right 04/10/2017   Procedure: CHONDROPLASTY;  Surgeon: Frederik Pear, MD;  Location: [place];  Service: Orthopedics;  Laterality: Right;  . KNEE ARTHROSCOPY Right    Menicus  . KNEE ARTHROSCOPY Right 04/10/2017   Procedure: ARTHROSCOPY KNEE;  Surgeon: Frederik Pear, MD;  Location: [place];  Service: Orthopedics;  Laterality: Right;  . KNEE ARTHROSCOPY WITH LATERAL MENISECTOMY Right 04/10/2017   Procedure: KNEE ARTHROSCOPY WITH  PARTIAL LATERAL MENISECTOMY;  Surgeon: Frederik Pear, MD;  Location: [place];  Service: Orthopedics;  Laterality: Right;  . LAPAROSCOPIC TOTAL HYSTERECTOMY  07/04/14  . ORIF ELBOW FRACTURE Left 04/04/2015   Procedure: OPEN REDUCTION INTERNAL FIXATION (ORIF) LEFT ELBOW/OLECRANON FRACTURE;  Surgeon: Iran Planas, MD;  Location: [place];  Service: Orthopedics;  Laterality: Left;  . TONSILLECTOMY      There were no vitals filed for this visit.      Subjective Assessment - 06/17/17 1817    Subjective Pt reports 6/10 R knee pain today and states she has had a rough past week with a death in the family. Pt states she had to do a lot of standing this past week and her knee has been swollen. She reports catching on the lateral aspect of her R knee and states the pain is "terrible" when it occurs.   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  Pt. kicked in R knee by pt. resulting in meniscal tear/ pain.  Pt. was initially immobilized and told to rest but pain never got better.     Limitations Standing;Walking;House hold activities;Lifting   Diagnostic tests MRI   Patient Stated Goals Increase R knee ROM/ strength to improve pain-free mobility/ return to work without limitaitons.     Currently in Pain? Yes   Pain Score 6    Pain Location Knee   Pain Orientation Right   Pain Descriptors / Indicators Aching   Pain Type Chronic pain   Pain Onset More than a month ago   Pain Frequency Constant   Multiple Pain Sites No      Treatment:  Ther-Ex:  Scifit L6 10 min. B LE/UE (warm-up/no charge). Prone active knee flexion x20 Palpation to lateral aspect of  knee during flexion revealed catching/grinding sensation and pt reported mod-severe pain with flexion Quad sets in supine x15 Mini squats in // bars x20. Pt reported pain with flexion Eccentric single leg step downs 15x2 BLE. Pt reported pain with flexion BOSU wt. Shifting L/R with no UE assist.     Manual tx.: Prone passive knee flexion x20. Palpation to lateral aspect of knee during flexion caused catching/grinding sensation and pt reported mod-severe pain with flexion STM to R distal quad./hamstring/ prox. gastroc region. Tenderness noted at posterolateral aspect of knee Ice x65mn        PT Education -  06/17/17 1819    Education provided Yes   Education Details ice and plan of care   Person(s) Educated Patient   Methods Explanation   Comprehension Verbalized understanding             PT Long Term Goals - 06/18/17 0826      PT LONG TERM GOAL #1   Title Pt. independent with HEP to increase R knee AROM to WNL as compared to L knee to improve pain-free mobility.   Baseline pain limited R flexion/ sharp catching over R lateral aspect of knee.     Time 4   Period Weeks   Status Not Met     PT LONG TERM GOAL #2   Title Pt. will increase LEFS to >50 out of 80 to improve pain-free mobility/ normalized gait pattern.     Baseline LEFS: 6 out of 80 (04/21/17)   Time 4   Period Weeks   Status On-going     PT LONG TERM GOAL #3   Title Pt. able to ambulate with normalized gait pattern and no assistive device on level surfaces safely.     Baseline R antalgic gait pattern with use of knee brace (no SPC use at this time).     Time 4   Period Weeks   Status Not Met     PT LONG TERM GOAL #4   Title Pt. able to ascend/descend 10 steps with recip. pattern and no R knee pain to improve functional mobility.     Baseline Ascends stairs with recip. pattern and descends with step to pattern.  Significant knee pain.     Time 4   Period Weeks   Status Not Met     PT LONG TERM GOAL #5   Title Pt. able to return to work with no R knee pain/limitations.     Baseline pt. working with light duty restrictions (primarily sitting).     Time 4   Period Weeks   Status Not Met             Plan - 06/17/17 1821    Clinical Impression Statement Pt presents with decreased ability to tolerate passive or active R knee flexion and complains of 7/10 pain with flexion activities. Upon palpation of lateral aspect of R knee, PT felt catching and grinding sensation while pt actively flexed her knee. Pt. will undergo MRI tomorrow and PT will discuss plan of care with pt once results are received   Clinical  Presentation Stable   Clinical Decision Making Moderate   Rehab Potential Excellent   PT Frequency 2x / week   PT Duration 4 weeks   PT Treatment/Interventions ADLs/Self Care Home Management;Cryotherapy;Moist Heat;Neuromuscular re-education;Gait training;Stair training;Functional mobility training;Therapeutic activities;Therapeutic exercise;Balance training;Patient/family education;Manual techniques;Passive range of motion   PT Next Visit Plan Increase R quad/ LE muscle strength/ pain mgmt.   PT Home  Exercise Plan see HEP   Consulted and Agree with Plan of Care Patient      Patient will benefit from skilled therapeutic intervention in order to improve the following deficits and impairments:  Abnormal gait, Improper body mechanics, Pain, Decreased mobility, Decreased activity tolerance, Decreased endurance, Decreased range of motion, Decreased strength, Hypomobility, Impaired flexibility, Decreased balance, Difficulty walking, Increased edema  Visit Diagnosis: Chronic pain of right knee  Other abnormalities of gait and mobility  Stiffness of right knee, not elsewhere classified  Gait difficulty  Muscle weakness (generalized)  Joint stiffness of knee, right     Problem List Patient Active Problem List   Diagnosis Date Noted  . Left radial head fracture 04/04/2015   Pura Spice, PT, DPT # [id number] Betsy Coder, SPT 06/18/2017, 8:29 AM  [place] [facility] [address] [place], [place], [address] Phone: [phone]   Fax:  [phone]  Name: Brenda Sellers MRN: [id number] Date of Birth: 1981/11/06

## 2017-06-18 ENCOUNTER — Ambulatory Visit
Admission: RE | Admit: 2017-06-18 | Discharge: 2017-06-18 | Disposition: A | Discharge status: Home / Self Care | Payer: 59 | Source: Ambulatory Visit | Attending: Orthopedic Surgery | Admitting: Orthopedic Surgery

## 2017-06-18 DIAGNOSIS — M25561 Pain in right knee: Secondary | ICD-10-CM | POA: Diagnosis present

## 2017-06-18 DIAGNOSIS — M1711 Unilateral primary osteoarthritis, right knee: Secondary | ICD-10-CM | POA: Diagnosis not present

## 2017-06-18 DIAGNOSIS — M25461 Effusion, right knee: Secondary | ICD-10-CM | POA: Insufficient documentation

## 2017-06-18 DIAGNOSIS — M7121 Synovial cyst of popliteal space [Baker], right knee: Secondary | ICD-10-CM | POA: Insufficient documentation

## 2017-06-22 ENCOUNTER — Ambulatory Visit: Payer: PRIVATE HEALTH INSURANCE | Admitting: Physical Therapy

## 2017-06-29 DIAGNOSIS — I471 Supraventricular tachycardia: Secondary | ICD-10-CM | POA: Diagnosis not present

## 2017-07-01 DIAGNOSIS — Z0000 Encounter for general adult medical examination without abnormal findings: Secondary | ICD-10-CM | POA: Diagnosis not present

## 2017-07-01 DIAGNOSIS — E538 Deficiency of other specified B group vitamins: Secondary | ICD-10-CM | POA: Diagnosis not present

## 2017-07-26 DIAGNOSIS — R05 Cough: Secondary | ICD-10-CM | POA: Diagnosis not present

## 2017-07-26 DIAGNOSIS — H6693 Otitis media, unspecified, bilateral: Secondary | ICD-10-CM | POA: Diagnosis not present

## 2017-07-26 DIAGNOSIS — R110 Nausea: Secondary | ICD-10-CM | POA: Diagnosis not present

## 2017-07-29 ENCOUNTER — Emergency Department: Payer: 59

## 2017-07-29 ENCOUNTER — Emergency Department
Admission: EM | Admit: 2017-07-29 | Discharge: 2017-07-29 | Disposition: A | Discharge status: Home / Self Care | Payer: 59 | Attending: Student in an Organized Health Care Education/Training Program | Admitting: Student in an Organized Health Care Education/Training Program

## 2017-07-29 DIAGNOSIS — R059 Cough, unspecified: Secondary | ICD-10-CM

## 2017-07-29 DIAGNOSIS — Z87891 Personal history of nicotine dependence: Secondary | ICD-10-CM | POA: Insufficient documentation

## 2017-07-29 DIAGNOSIS — J111 Influenza due to unidentified influenza virus with other respiratory manifestations: Secondary | ICD-10-CM

## 2017-07-29 DIAGNOSIS — R69 Illness, unspecified: Secondary | ICD-10-CM | POA: Insufficient documentation

## 2017-07-29 DIAGNOSIS — Z79899 Other long term (current) drug therapy: Secondary | ICD-10-CM | POA: Diagnosis not present

## 2017-07-29 DIAGNOSIS — R05 Cough: Secondary | ICD-10-CM | POA: Insufficient documentation

## 2017-07-29 DIAGNOSIS — E860 Dehydration: Secondary | ICD-10-CM | POA: Diagnosis not present

## 2017-07-29 DIAGNOSIS — R509 Fever, unspecified: Secondary | ICD-10-CM | POA: Diagnosis present

## 2017-07-29 DIAGNOSIS — R0602 Shortness of breath: Secondary | ICD-10-CM | POA: Diagnosis not present

## 2017-07-29 LAB — CBC
HCT: 43.1 % (ref 35.0–47.0)
Hemoglobin: 14.8 g/dL (ref 12.0–16.0)
MCH: 29.5 pg (ref 26.0–34.0)
MCHC: 34.4 g/dL (ref 32.0–36.0)
MCV: 85.7 fL (ref 80.0–100.0)
Platelets: 340 10*3/uL (ref 150–440)
RBC: 5.03 MIL/uL (ref 3.80–5.20)
RDW: 12.4 % (ref 11.5–14.5)
WBC: 10.3 10*3/uL (ref 3.6–11.0)

## 2017-07-29 LAB — COMPREHENSIVE METABOLIC PANEL
ALBUMIN: 4.5 g/dL (ref 3.5–5.0)
ALT: 26 U/L (ref 14–54)
ANION GAP: 10 (ref 5–15)
AST: 23 U/L (ref 15–41)
Alkaline Phosphatase: 63 U/L (ref 38–126)
BILIRUBIN TOTAL: 0.7 mg/dL (ref 0.3–1.2)
BUN: 8 mg/dL (ref 6–20)
CALCIUM: 9.6 mg/dL (ref 8.9–10.3)
CO2: 26 mmol/L (ref 22–32)
Chloride: 104 mmol/L (ref 101–111)
Creatinine, Ser: 0.72 mg/dL (ref 0.44–1.00)
GFR calc Af Amer: >60 mL/min (ref >60)
GLUCOSE: 97 mg/dL (ref 65–99)
POTASSIUM: 4.1 mmol/L (ref 3.5–5.1)
Sodium: 140 mmol/L (ref 135–145)
TOTAL PROTEIN: 7.8 g/dL (ref 6.5–8.1)

## 2017-07-29 LAB — URINALYSIS, COMPLETE (UACMP) WITH MICROSCOPIC
BILIRUBIN URINE: NEGATIVE
Bacteria, UA: NONE SEEN
GLUCOSE, UA: NEGATIVE mg/dL
HGB URINE DIPSTICK: NEGATIVE
Ketones, ur: 5 mg/dL — AB
LEUKOCYTES UA: NEGATIVE
NITRITE: NEGATIVE
Protein, ur: NEGATIVE mg/dL
SPECIFIC GRAVITY, URINE: 1.015 (ref 1.005–1.030)
pH: 7 (ref 5.0–8.0)

## 2017-07-29 LAB — LIPASE, BLOOD: Lipase: 20 U/L (ref 11–51)

## 2017-07-29 MED ORDER — METHYLPREDNISOLONE SODIUM SUCC 125 MG IJ SOLR
60.0000 mg | Freq: Once | INTRAMUSCULAR | Status: AC
  Administered 2017-07-29: 60 mg via INTRAVENOUS
  Filled 2017-07-29: qty 0.96
  Filled 2017-07-29: qty 2

## 2017-07-29 MED ORDER — DOXYCYCLINE HYCLATE 100 MG PO TABS
100.0000 mg | ORAL_TABLET | Freq: Once | ORAL | Status: DC
  Filled 2017-07-29: qty 1

## 2017-07-29 MED ORDER — ACETAMINOPHEN 325 MG PO TABS
ORAL_TABLET | ORAL | Status: AC
  Administered 2017-07-29: 650 mg via ORAL
  Filled 2017-07-29: qty 2

## 2017-07-29 MED ORDER — HYDROCOD POLST-CPM POLST ER 10-8 MG/5ML PO SUER: 5.0000 mL | Freq: Every evening | ORAL | 0 refills | Status: DC | PRN

## 2017-07-29 MED ORDER — HYDROCOD POLST-CPM POLST ER 10-8 MG/5ML PO SUER
ORAL | Status: AC
  Administered 2017-07-29: 5 mL via ORAL
  Filled 2017-07-29: qty 5

## 2017-07-29 MED ORDER — DOXYCYCLINE HYCLATE 50 MG PO CAPS: 100.0000 mg | ORAL_CAPSULE | Freq: Two times a day (BID) | ORAL | 0 refills | Status: AC

## 2017-07-29 MED ORDER — SODIUM CHLORIDE 0.9 % IV BOLUS (SEPSIS)
1000.0000 mL | Freq: Once | INTRAVENOUS | Status: AC
  Administered 2017-07-29: 1000 mL via INTRAVENOUS

## 2017-07-29 MED ORDER — ACETAMINOPHEN 325 MG PO TABS
650.0000 mg | ORAL_TABLET | Freq: Once | ORAL | Status: AC
  Administered 2017-07-29: 650 mg via ORAL

## 2017-07-29 MED ORDER — PROMETHAZINE HCL 12.5 MG PO TABS: 12.5000 mg | ORAL_TABLET | Freq: Four times a day (QID) | ORAL | 0 refills | Status: DC | PRN

## 2017-07-29 MED ORDER — LIDOCAINE HCL (PF) 4 % IJ SOLN
4.0000 mL | Freq: Once | INTRAMUSCULAR | Status: AC
  Administered 2017-07-29: 4 mL via RESPIRATORY_TRACT
  Filled 2017-07-29 (×2): qty 5

## 2017-07-29 MED ORDER — PROMETHAZINE HCL 25 MG/ML IJ SOLN
12.5000 mg | Freq: Four times a day (QID) | INTRAMUSCULAR | Status: DC | PRN
  Administered 2017-07-29: 12.5 mg via INTRAVENOUS
  Filled 2017-07-29: qty 1

## 2017-07-29 MED ORDER — PREDNISONE 10 MG PO TABS: 10.0000 mg | ORAL_TABLET | Freq: Every day | ORAL | 0 refills | Status: DC

## 2017-07-29 MED ORDER — IPRATROPIUM-ALBUTEROL 0.5-2.5 (3) MG/3ML IN SOLN
RESPIRATORY_TRACT | Status: AC
  Administered 2017-07-29: 3 mL via RESPIRATORY_TRACT
  Filled 2017-07-29: qty 3

## 2017-07-29 MED ORDER — IPRATROPIUM-ALBUTEROL 0.5-2.5 (3) MG/3ML IN SOLN
3.0000 mL | Freq: Once | RESPIRATORY_TRACT | Status: AC
  Administered 2017-07-29: 3 mL via RESPIRATORY_TRACT
  Filled 2017-07-29: qty 3

## 2017-07-29 MED ORDER — HYDROCOD POLST-CPM POLST ER 10-8 MG/5ML PO SUER
5.0000 mL | Freq: Once | ORAL | Status: AC
  Administered 2017-07-29: 5 mL via ORAL
  Filled 2017-07-29: qty 5

## 2017-07-29 MED ORDER — ALBUTEROL SULFATE HFA 108 (90 BASE) MCG/ACT IN AERS: 2.0000 | INHALATION_SPRAY | Freq: Four times a day (QID) | RESPIRATORY_TRACT | 2 refills | Status: DC | PRN

## 2017-07-29 MED ORDER — PROMETHAZINE HCL 25 MG/ML IJ SOLN: 12.5000 mg | Freq: Four times a day (QID) | INTRAMUSCULAR | Status: DC | PRN

## 2017-07-29 NOTE — ED Provider Notes (Signed)
[facility] Emergency Department Provider Note    First MD Initiated Contact with Patient 07/29/17 1816     (approximate)  I have reviewed the triage vital signs and the nursing notes.   HISTORY  Chief Complaint Fever; URI; and Diarrhea    HPI Brenda Sellers is a 36 y.o. female presents with chief complaint of 2 weeks of cough congestion chills now having nausea vomiting diarrhea after recently being started on Keflex. Patient was seen in the walk-in clinic over the weekend was given the Keflex without any significant improvement in her cough. Has not been prescribed albuterol treatments. No previous history of bronchitis or asthma. Denies any abdominal pain. No blood in her stool. Still having thick productive cough.   Past Medical History:  Diagnosis Date  . Anxiety   . Complication of anesthesia   . Nerve pain   . Pericardial cyst   . PONV (postoperative nausea and vomiting)    Last surgery 06/2014- nausea not vomitting was pre medicated.   No family history on file. Past Surgical History:  Procedure Laterality Date  . ABDOMINAL HYSTERECTOMY    . ANTERIOR CRUCIATE LIGAMENT REPAIR Left 1998   Menicus, PCL Screw  . CHOLECYSTECTOMY    . CHONDROPLASTY Right 04/10/2017   Procedure: CHONDROPLASTY;  Surgeon: Gean Birchwoodowan, Frank, MD;  Location: [facility];  Service: Orthopedics;  Laterality: Right;  . KNEE ARTHROSCOPY Right    Menicus  . KNEE ARTHROSCOPY Right 04/10/2017   Procedure: ARTHROSCOPY KNEE;  Surgeon: Gean Birchwoodowan, Frank, MD;  Location: [facility];  Service: Orthopedics;  Laterality: Right;  . KNEE ARTHROSCOPY WITH LATERAL MENISECTOMY Right 04/10/2017   Procedure: KNEE ARTHROSCOPY WITH  PARTIAL LATERAL MENISECTOMY;  Surgeon: Gean Birchwoodowan, Frank, MD;  Location: [facility];  Service: Orthopedics;  Laterality: Right;  . LAPAROSCOPIC TOTAL HYSTERECTOMY  07/04/14  . ORIF ELBOW FRACTURE Left 04/04/2015   Procedure: OPEN  REDUCTION INTERNAL FIXATION (ORIF) LEFT ELBOW/OLECRANON FRACTURE;  Surgeon: Bradly BienenstockFred Ortmann, MD;  [facility];  Service: Orthopedics;  Laterality: Left;  . TONSILLECTOMY     Patient Active Problem List   Diagnosis Date Noted  . Left radial head fracture 04/04/2015      Prior to Admission medications   Medication Sig Start Date End Date Taking? Authorizing Provider  ALPRAZolam (XANAX) 0.25 MG tablet Take 0.25 mg by mouth every 4 (four) hours as needed for anxiety.    [provider]  buPROPion (WELLBUTRIN) 100 MG tablet Take 100 mg by mouth 2 (two) times daily.    [provider]  diazepam (VALIUM) 5 MG tablet Take 5 mg by mouth at bedtime.    [provider]  DULoxetine (CYMBALTA) 20 MG capsule Take 20 mg by mouth daily.    [provider]  gabapentin (NEURONTIN) 300 MG capsule Take 300 mg by mouth 2 (two) times daily.    [provider]  methocarbamol (ROBAXIN) 500 MG tablet Take 2 tablets (1,000 mg total) by mouth 4 (four) times daily as needed (Pain). 01/11/16   Pisciotta, Joni ReiningNicole, PA-C  oxyCODONE-acetaminophen (ROXICET) 5-325 MG tablet Take 1 tablet by mouth every 6 (six) hours as needed. 04/10/17   Allena KatzPhillips, Eric K, PA-C  prochlorperazine (COMPAZINE) 10 MG tablet Take 1 tablet (10 mg total) by mouth every 6 (six) hours as needed for nausea. 01/29/17 01/29/18  Jeanmarie PlantMcShane, James A, MD  traMADol (ULTRAM-ER) 100 MG 24 hr tablet Take 100 mg by mouth daily. 150mg  TID  [provider]    Allergies Augmentin [amoxicillin-pot clavulanate] and Vicodin [hydrocodone-acetaminophen]    Social History Social History  Substance Use Topics  . Smoking status: Former Smoker    Years: 10.00  . Smokeless tobacco: Never Used     Comment: Quit 2006  . Alcohol use No    Review of Systems Patient denies headaches, rhinorrhea, blurry vision, numbness, shortness of breath, chest pain, edema, cough, abdominal pain, nausea, vomiting, diarrhea, dysuria,  fevers, rashes or hallucinations unless otherwise stated above in HPI. ____________________________________________   PHYSICAL EXAM:  VITAL SIGNS: Vitals:   07/29/17 1643 07/29/17 1644  BP:  (!) 116/98  Pulse: 90   Resp:    Temp: 98.7 F (37.1 C)   SpO2: 100%     Constitutional: Alert and oriented. Well appearing and in no acute distress. Eyes: Conjunctivae are normal.  Head: Atraumatic. Nose: No congestion/rhinnorhea. Mouth/Throat: Mucous membranes are moist.   Neck: No stridor. Painless ROM.  Cardiovascular: Normal rate, regular rhythm. Grossly normal heart sounds.  Good peripheral circulation. Respiratory: Normal respiratory effort.  No retractions. Lungs with coarse breathsounds throughout Gastrointestinal: Soft and nontender. No distention. No abdominal bruits. No CVA tenderness. Genitourinary:  Musculoskeletal: No lower extremity tenderness nor edema.  No joint effusions. Neurologic:  Normal speech and language. No gross focal neurologic deficits are appreciated. No facial droop Skin:  Skin is warm, dry and intact. No rash noted. Psychiatric: Mood and affect are normal. Speech and behavior are normal.  ____________________________________________   LABS (all labs ordered are listed, but only abnormal results are displayed)  Results for orders placed or performed during the hospital encounter of 07/29/17 (from the past 24 hour(s))  Lipase, blood     Status: None   Collection Time: 07/29/17  4:44 PM  Result Value Ref Range   Lipase 20 11 - 51 U/L  Comprehensive metabolic panel     Status: None   Collection Time: 07/29/17  4:44 PM  Result Value Ref Range   Sodium 140 135 - 145 mmol/L   Potassium 4.1 3.5 - 5.1 mmol/L   Chloride 104 101 - 111 mmol/L   CO2 26 22 - 32 mmol/L   Glucose, Bld 97 65 - 99 mg/dL   BUN 8 6 - 20 mg/dL   Creatinine, Ser 6.96 0.44 - 1.00 mg/dL   Calcium 9.6 8.9 - 29.5 mg/dL   Total Protein 7.8 6.5 - 8.1 g/dL   Albumin 4.5 3.5 - 5.0 g/dL    AST 23 15 - 41 U/L   ALT 26 14 - 54 U/L   Alkaline Phosphatase 63 38 - 126 U/L   Total Bilirubin 0.7 0.3 - 1.2 mg/dL   GFR calc non Af Amer >60 >60 mL/min   GFR calc Af Amer >60 >60 mL/min   Anion gap 10 5 - 15  CBC     Status: None   Collection Time: 07/29/17  4:44 PM  Result Value Ref Range   WBC 10.3 3.6 - 11.0 K/uL   RBC 5.03 3.80 - 5.20 MIL/uL   Hemoglobin 14.8 12.0 - 16.0 g/dL   HCT 28.4 13.2 - 44.0 %   MCV 85.7 80.0 - 100.0 fL   MCH 29.5 26.0 - 34.0 pg   MCHC 34.4 32.0 - 36.0 g/dL   RDW 10.2 72.5 - 36.6 %   Platelets 340 150 - 440 K/uL  Urinalysis, Complete w Microscopic     Status: Abnormal   Collection Time: 07/29/17  4:44 PM  Result  Value Ref Range   Color, Urine YELLOW (A) YELLOW   APPearance CLEAR (A) CLEAR   Specific Gravity, Urine 1.015 1.005 - 1.030   pH 7.0 5.0 - 8.0   Glucose, UA NEGATIVE NEGATIVE mg/dL   Hgb urine dipstick NEGATIVE NEGATIVE   Bilirubin Urine NEGATIVE NEGATIVE   Ketones, ur 5 (A) NEGATIVE mg/dL   Protein, ur NEGATIVE NEGATIVE mg/dL   Nitrite NEGATIVE NEGATIVE   Leukocytes, UA NEGATIVE NEGATIVE   RBC / HPF 0-5 0 - 5 RBC/hpf   WBC, UA 0-5 0 - 5 WBC/hpf   Bacteria, UA NONE SEEN NONE SEEN   Squamous Epithelial / LPF 0-5 (A) NONE SEEN   Mucus PRESENT    ____________________________________________ ____________________________________________  RADIOLOGY  I personally reviewed all radiographic images ordered to evaluate for the above acute complaints and reviewed radiology reports and findings.  These findings were personally discussed with the patient.  Please see medical record for radiology report.  ____________________________________________   PROCEDURES  Procedure(s) performed:  Procedures    Critical Care performed: no ____________________________________________   INITIAL IMPRESSION / ASSESSMENT AND PLAN / ED COURSE  Pertinent labs & imaging results that were available during my care of the patient were reviewed by me  and considered in my medical decision making (see chart for details).  DDX: copd, pna, ptx, chf, uri, flu, cap  Brenda Sellers is a 36 y.o. who presents to the ED with cough congestion chills for 2 weeks now with nausea vomiting diarrhea. Patient is well-appearing afebrile without any hypoxia. She does have a thick productive cough. Physical exam is more consistent with some component of bronchitis.  Low risk wells and PERC negative.  Chest x-ray shows no consolidation she has no leukocytosis. Seems less consistent with lobar pneumonia. Patient given nebulizers as well as IV fluids and steroid burst. We will give doxycycline as this would be better coverage for community acquired pneumonia. Patient not having any episodes of vomiting or diarrhea in the ER. She is tolerating oral hydration. Patient is stable for follow-up with her PCP.      ____________________________________________   FINAL CLINICAL IMPRESSION(S) / ED DIAGNOSES  Final diagnoses:  Cough  Influenza-like illness      NEW MEDICATIONS STARTED DURING THIS VISIT:  New Prescriptions   No medications on file     Note:  This document was prepared using Dragon voice recognition software and may include unintentional dictation errors.    Willy Eddy, MD 07/29/17 2107

## 2017-07-29 NOTE — ED Triage Notes (Signed)
[facility], congestion fever chills for the past 2 weeks and  For the past week having N/V/D..Marland Kitchen

## 2017-07-29 NOTE — ED Notes (Signed)
Pt states she would like tylenol for headache, EDP notified, see MAR for follow up.

## 2017-10-08 IMAGING — MR MR SHOULDER*L* W/O CM
4 of 5 series · 19 of 40 positions shown · non-contrast
Comparison: None.

CLINICAL DATA: Left shoulder pain and swelling. Fall 6 weeks ago
while roller-skating.

EXAM:
MRI OF THE LEFT SHOULDER WITHOUT CONTRAST
TECHNIQUE: Multiplanar, multisequence MR imaging of the shoulder was performed.
No intravenous contrast was administered.

[Series 3: T2 fat-sat · axial · 4.0mm · 0.23mm/px · z∈[-59,+29]mm · 5 of 23 slices shown (1 of 3)]
[im 1/23]
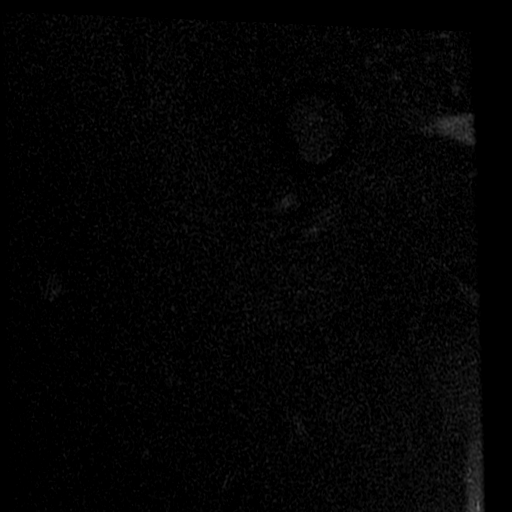
[im 4/23]
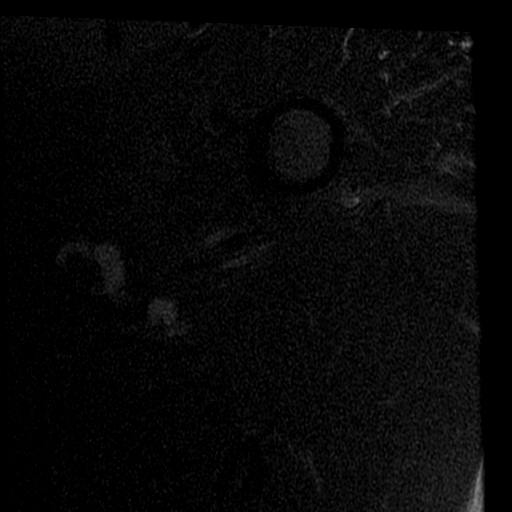
[im 7/23]
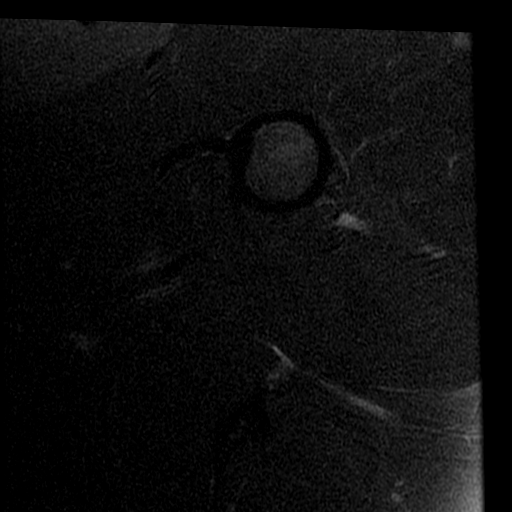
[im 13/23]
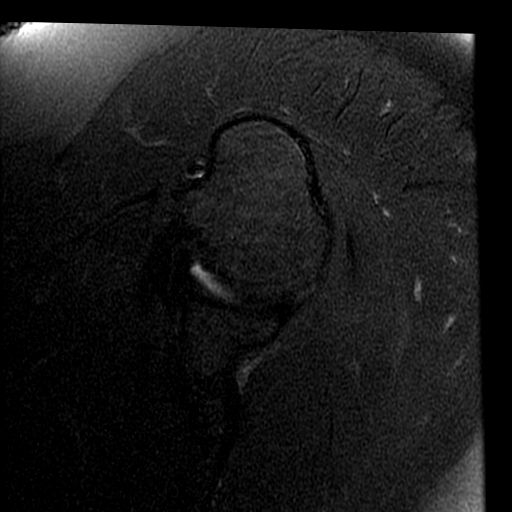
[im 19/23]
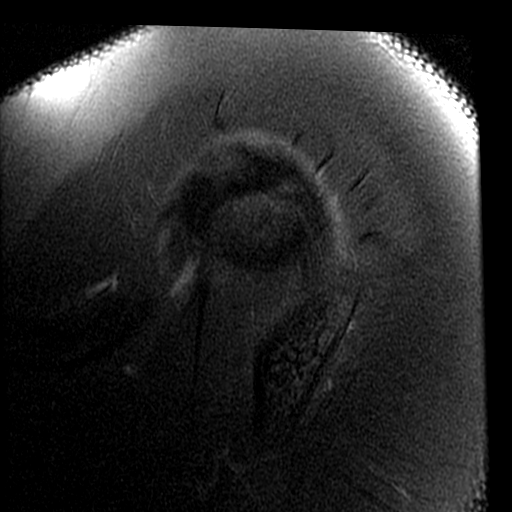

[Series 4: T2 fat-sat · oblique · 4.0mm · 0.29mm/px · 3 of 20 slices shown (2 of 3)]
[im 3/20]
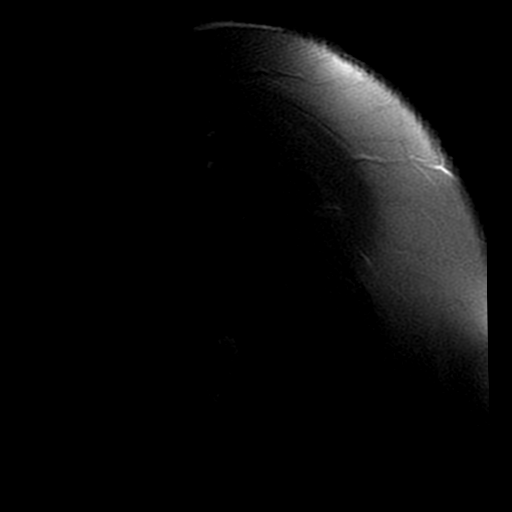
[im 11/20]
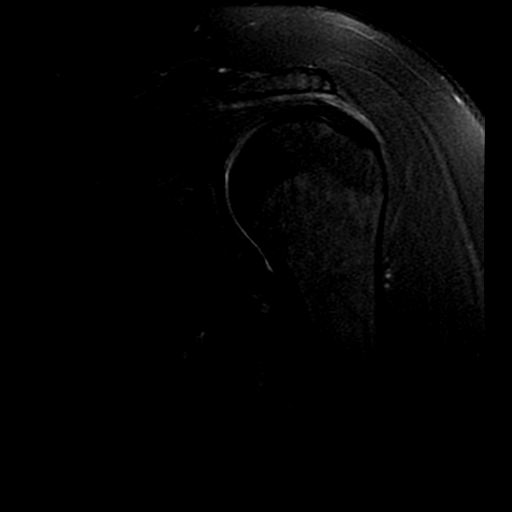
[im 17/20]
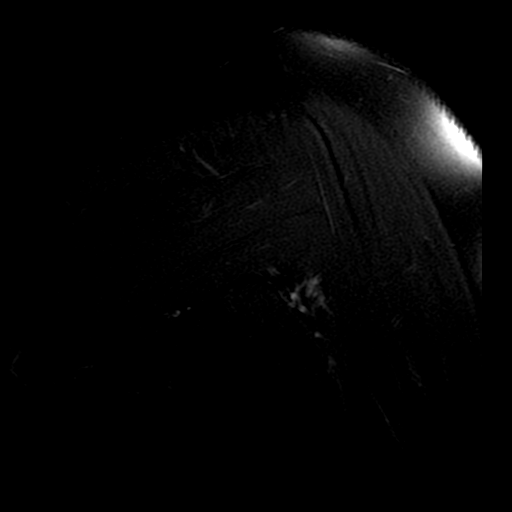

[Series 5: PD · oblique · 4.0mm · 0.29mm/px · 8 of 20 slices shown]
[im 1/20]
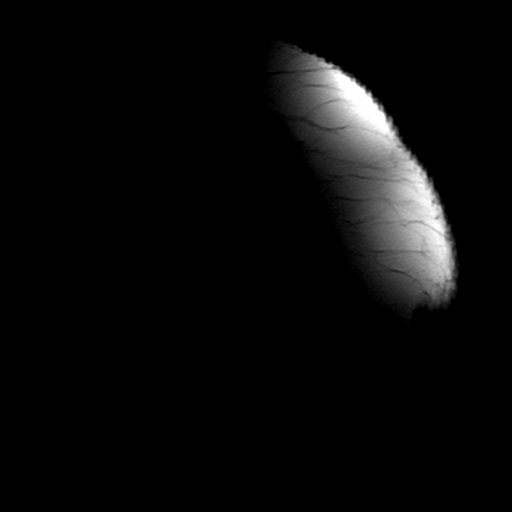
[im 3/20]
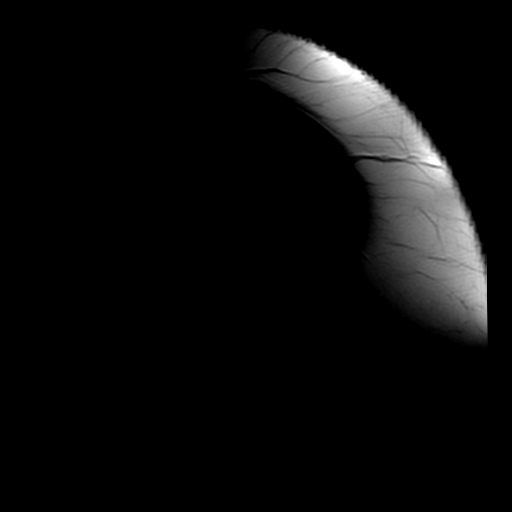
[im 6/20]
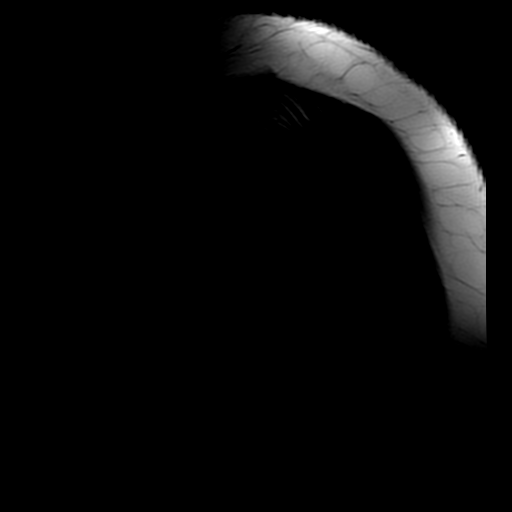
[im 9/20]
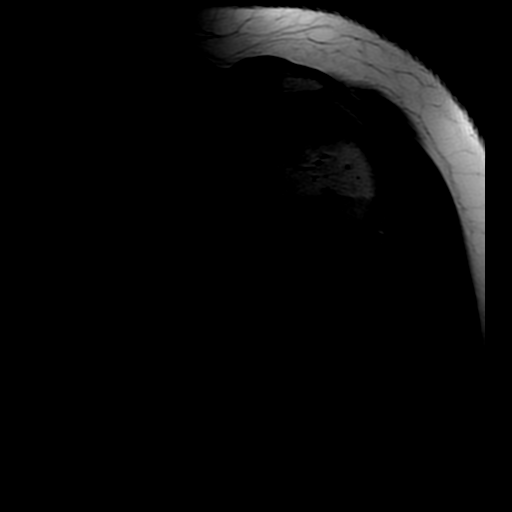
[im 11/20]
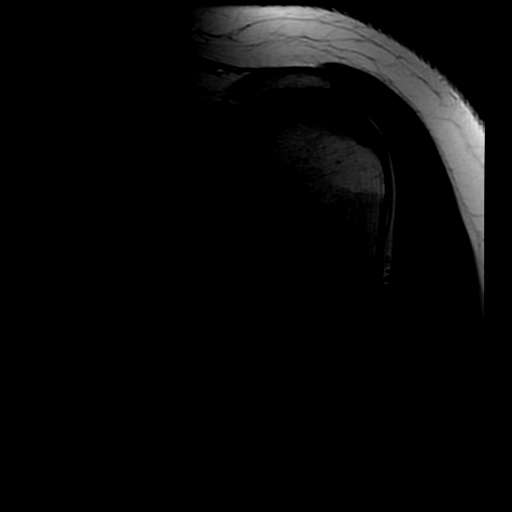
[im 14/20]
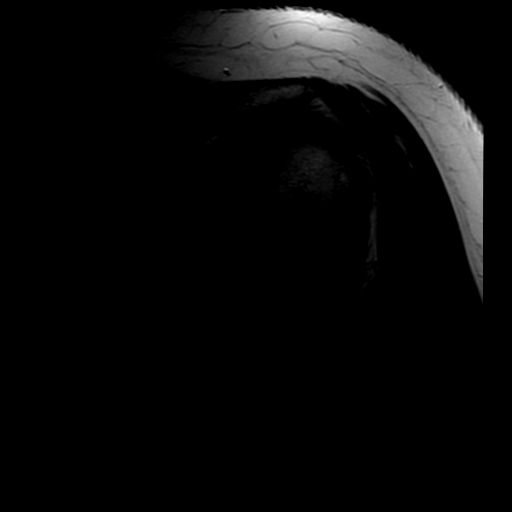
[im 17/20]
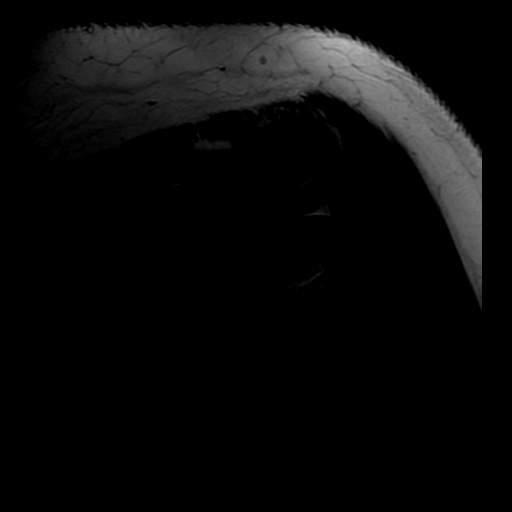
[im 20/20]
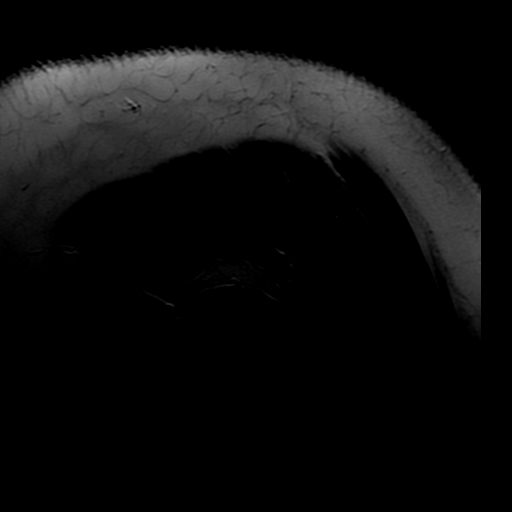

[Series 7: T2 fat-sat · oblique · 4.0mm · 0.29mm/px · 3 of 20 slices shown (3 of 3)]
[im 3/20]
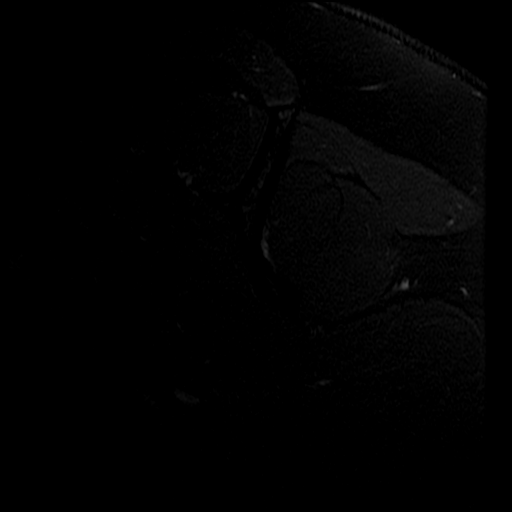
[im 11/20]
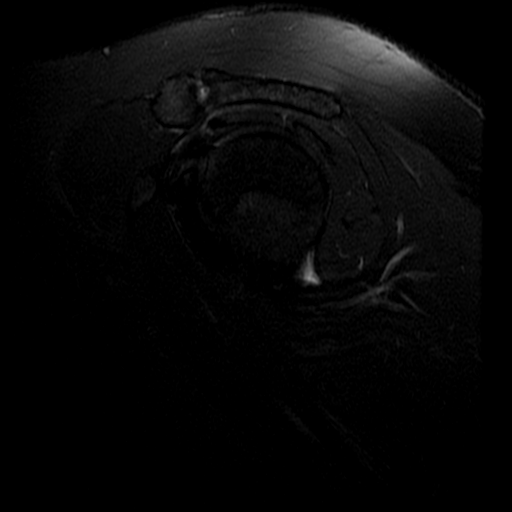
[im 17/20]
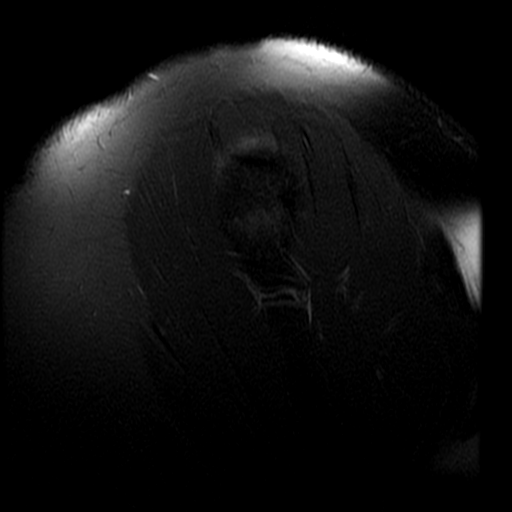

[19 of 40 positions shown; findings below may reference images not displayed]

FINDINGS: Rotator cuff: Mild distal supraspinatus tendinopathy particularly
along the bursal side of the tendon as on image 12 series 4. No
rotator cuff tear identified.

Muscles:  Unremarkable

Biceps long head:  Unremarkable

Acromioclavicular Joint: Minimal degenerative AC joint arthropathy.
Subacromial morphology is type 2 (curved). There is a small but
abnormal amount of fluid in the subacromial subdeltoid bursa.

Glenohumeral Joint: Unremarkable

Labrum:  Grossly unremarkable

Bones: No significant extra-articular osseous abnormalities
identified.
IMPRESSION: 1. Mild subacromial subdeltoid bursitis.
2. Mild supraspinatus tendinopathy particularly along the distal
bursal side of the tendon, without overt tear.

## 2017-11-25 ENCOUNTER — Other Ambulatory Visit: Payer: Self-pay | Admitting: Orthopedic Surgery

## 2017-11-25 NOTE — Pre-Procedure Instructions (Signed)
Sampson SiStacy I Natal  11/25/2017      [facility][id number] - [place], [place] - [id number] [place] [id number] Phone: [phone] Fax: [phone]  [facility] [address] - [place], [place] - [address] [place] [address] Phone: [phone] Fax: [phone]  [facility] [place], [place] - [address] [place] [address] Phone: [phone] Fax: [phone]    Your procedure is scheduled on Jan 14   [facility] Ad[facility] at [employer].  Call this number if you have problems the morning of surgery:  [phone]   Remember:  Do not eat food or drink liquids after midnight.  Take these medicines the morning of surgery with A SIP OF WATER Albuterol inhaler (Bring with you on the day of surgery), Alprazolam (Xanax) if needed, Bupropion (Wellbutrin), Duloxetine (Cymbalta), Gabapentin (Neurontin), Methocarbamol (Robaxin) if needed, Oxycodone (Roxicet) or tramadol (Ultram) if needed, Prednisone (Deltasone), Promethazine (Phenergan) or compazine if needed  Stop taking aspirin, BC's, Goody's, herbal medications, Fish Oil, Aleve, Ibuprofen, Advil, Motrin, Vitamins    Do not wear jewelry, make-up or nail polish.  Do not wear lotions, powders, or perfumes, or deodorant.  Do not shave 48 hours prior to surgery.  Men may shave face and neck.  Do not bring valuables to the hospital.  [facility] is not responsible for any belongings or valuables.  Contacts, dentures or bridgework may not be worn into surgery.  Leave your suitcase in the car.  After surgery it may be brought to your room.  For patients admitted to the hospital, discharge time will be determined by your treatment team.  Patients discharged the day of surgery will not be allowed to drive home.    Special instructions:   - Preparing for Surgery  Before  surgery, you can play an important role.  Because skin is not sterile, your skin needs to be as free of germs as possible.  You can reduce the number of germs on you skin by washing with CHG (chlorahexidine gluconate) soap before surgery.  CHG is an antiseptic cleaner which kills germs and bonds with the skin to continue killing germs even after washing.  Please DO NOT use if you have an allergy to CHG or antibacterial soaps.  If your skin becomes reddened/irritated stop using the CHG and inform your nurse when you arrive at Short Stay.  Do not shave (including legs and underarms) for at least 48 hours prior to the first CHG shower.  You may shave your face.  Please follow these instructions carefully:   1.  Shower with CHG Soap the night before surgery and the morning of Surgery.  2.  If you choose to wash your hair, wash your hair first as usual with your  normal shampoo.  3.  After you shampoo, rinse your hair and body thoroughly to remove the  Shampoo.  4.  Use CHG as you would any other liquid soap.  You can apply chg directly   to the skin and wash gently with scrungie or a clean washcloth.  5.  Apply the CHG Soap to your body ONLY FROM THE NECK DOWN.    Do not use on open wounds or open sores.  Avoid contact with your eyes,   ears, mouth and genitals (private parts).  Wash genitals (private parts)  with your normal soap.  6.  Wash thoroughly, paying special attention to the area where your surgery  will be performed.  7.  Thoroughly rinse your body with warm water from the neck down.  8.  DO NOT shower/wash with your normal soap after using and rinsing off the CHG Soap.  9.  Pat yourself dry with a clean towel.            10.  Wear clean pajamas.            11.  Place clean sheets on your bed the night of your first shower and do not sleep with pets.  Day of Surgery  Do not apply any lotions/deoderants the morning of surgery.  Please wear clean clothes to the hospital/surgery  center.     Please read over the following fact sheets that you were given. Pain Booklet, Coughing and Deep Breathing, MRSA Information and Surgical Site Infection Prevention

## 2017-11-26 ENCOUNTER — Encounter (HOSPITAL_COMMUNITY): Payer: Self-pay

## 2017-11-26 ENCOUNTER — Other Ambulatory Visit: Payer: Self-pay

## 2017-11-26 ENCOUNTER — Encounter (HOSPITAL_COMMUNITY)
Admission: RE | Admit: 2017-11-26 | Discharge: 2017-11-26 | Disposition: A | Discharge status: Home / Self Care | Payer: PRIVATE HEALTH INSURANCE | Source: Ambulatory Visit | Attending: Orthopedic Surgery | Admitting: Orthopedic Surgery

## 2017-11-26 DIAGNOSIS — Z01812 Encounter for preprocedural laboratory examination: Secondary | ICD-10-CM | POA: Diagnosis present

## 2017-11-26 DIAGNOSIS — I318 Other specified diseases of pericardium: Secondary | ICD-10-CM | POA: Insufficient documentation

## 2017-11-26 DIAGNOSIS — Z01810 Encounter for preprocedural cardiovascular examination: Secondary | ICD-10-CM | POA: Diagnosis present

## 2017-11-26 HISTORY — DX: Headache: R51

## 2017-11-26 HISTORY — DX: Headache, unspecified: R51.9

## 2017-11-26 HISTORY — DX: Gastro-esophageal reflux disease without esophagitis: K21.9

## 2017-11-26 HISTORY — DX: Unspecified osteoarthritis, unspecified site: M19.90

## 2017-11-26 HISTORY — DX: Supraventricular tachycardia: I47.1

## 2017-11-26 HISTORY — DX: Supraventricular tachycardia, unspecified: I47.10

## 2017-11-26 LAB — CBC
HCT: 37.7 % (ref 36.0–46.0)
HEMOGLOBIN: 12.9 g/dL (ref 12.0–15.0)
MCH: 30.5 pg (ref 26.0–34.0)
MCHC: 34.2 g/dL (ref 30.0–36.0)
MCV: 89.1 fL (ref 78.0–100.0)
Platelets: 258 10*3/uL (ref 150–400)
RBC: 4.23 MIL/uL (ref 3.87–5.11)
RDW: 12.4 % (ref 11.5–15.5)
WBC: 7.7 10*3/uL (ref 4.0–10.5)

## 2017-11-26 LAB — BASIC METABOLIC PANEL
ANION GAP: 7 (ref 5–15)
BUN: 11 mg/dL (ref 6–20)
CHLORIDE: 105 mmol/L (ref 101–111)
CO2: 27 mmol/L (ref 22–32)
Calcium: 9.3 mg/dL (ref 8.9–10.3)
Creatinine, Ser: 0.73 mg/dL (ref 0.44–1.00)
GFR calc non Af Amer: >60 mL/min (ref >60)
Glucose, Bld: 120 mg/dL — ABNORMAL HIGH (ref 65–99)
POTASSIUM: 3.9 mmol/L (ref 3.5–5.1)
SODIUM: 139 mmol/L (ref 135–145)

## 2017-11-26 LAB — SURGICAL PCR SCREEN
MRSA, PCR: NEGATIVE
STAPHYLOCOCCUS AUREUS: NEGATIVE

## 2017-11-26 LAB — HCG, SERUM, QUALITATIVE: Preg, Serum: NEGATIVE

## 2017-11-27 ENCOUNTER — Encounter (HOSPITAL_COMMUNITY): Payer: Self-pay

## 2017-11-27 NOTE — Progress Notes (Signed)
Anesthesia Chart Review:  Pt is a 37 year old female scheduled for R total knee arthroplasty on 12/07/2017 with Brenda BirchwoodFrank Rowan, MD  - PCP is Brenda PunchesMark Miller, MD (notes in care everywhere)   - Saw cardiologist Brenda BusmanBruce Kowaski, MD 08/19/16 for SOB, PSVT.  Echo, holter monitor, stress test ordered.  Only echo was ever done.  Pt hasn't been back (notes in care everywhere).   - Pt saw thoracic surgeon Brenda Lollarmelo Milano, MD 08/20/16 for pericardial cyst (initial visit in 2014 for this problem). CT confirms pericardial cyst. Pt is unsure of when she is to f/u.   PMH includes:   - Pericardial cyst, PSVT, post-op N/V, GERD. Former smoker. BMI 34.   -S/p R knee arthroscopy 04/10/17. S/p ORIF L olecranon fx 04/04/15.   Medications reviewed.  BP (!) 106/56   Pulse 87   Temp 36.8 C   Resp 20   Ht 5\' 9"  (1.753 m)   Wt 231 lb (104.8 kg)   SpO2 100%   BMI 34.11 kg/m   Preoperative labs reviewed.    CXR 07/29/17: No active cardiopulmonary disease.  EKG 11/26/17: NSR. Nonspecific T wave abnormality  CT AAA 09/05/16 (care everywhere) 1. Normal appearance of the abdominal aorta without aneurysmal dilation or dissection. 2. Trace effusion and small focus of consolidation at the left lung base. 3. Hepatic steatosis. 4. 43 x 25 mm low-attenuation lesion adjacent the pericardium likely a pericardial cyst.   CT chest 09/04/16 (care everywhere):  1. No evidence of acute pulmonary embolism. 2. Left medial lower lobe consolidative opacity may represent infection.  Recommend radiographic follow-up to ensure resolution and to exclude other causes.  3. There is a cystic lesion adjacent to the right cardiophrenic angle favored to represent a pericardial cyst.  Echo 09/04/16 (care everywhere):  1. Normal LV systolic function. EF 55% 2. Normal LA pressures with normal diastolic function. 3. Normal RV systolic function. 4. No valvular regurgitation. 5. No valvular stenosis. 6. Linear echo density noted in the abdominal  aorta. Clinical correlations chest. 7. Pericardial cyst not appreciated on today's exam. 8. Poor sound transmission   Reviewed case with Dr. Sandford Craze. Jackson. If no changes, I anticipate pt can proceed with surgery as scheduled.   Brenda Mastngela Laquiesha Piacente, FNP-BC [facility] Phone: [phone] 11/27/2017 3:22 PM

## 2017-12-04 DIAGNOSIS — M1711 Unilateral primary osteoarthritis, right knee: Secondary | ICD-10-CM | POA: Diagnosis present

## 2017-12-04 MED ORDER — TRANEXAMIC ACID 1000 MG/10ML IV SOLN
2000.0000 mg | INTRAVENOUS | Status: DC
  Filled 2017-12-04: qty 20

## 2017-12-04 MED ORDER — VANCOMYCIN HCL 10 G IV SOLR
1500.0000 mg | INTRAVENOUS | Status: AC
  Administered 2017-12-07: 1500 mg via INTRAVENOUS
  Filled 2017-12-04: qty 1500

## 2017-12-04 NOTE — H&P (Signed)
TOTAL KNEE ADMISSION H&P  Patient is being admitted for right total knee arthroplasty.  Subjective:  Chief Complaint:right knee pain.  HPI: Brenda Sellers, 37 y.o. female, has a history of pain and functional disability in the right knee due to arthritis and has failed non-surgical conservative treatments for greater than 12 weeks to includeNSAID's and/or analgesics, corticosteriod injections, viscosupplementation injections, flexibility and strengthening excercises, supervised PT with diminished ADL's post treatment, use of assistive devices, weight reduction as appropriate and activity modification.  Onset of symptoms was abrupt, starting 1 years ago with rapidlly worsening course since that time. The patient noted prior procedures on the knee to include  arthroscopy on the right knee(s).  Patient currently rates pain in the right knee(s) at 10 out of 10 with activity. Patient has night pain, worsening of pain with activity and weight bearing, pain that interferes with activities of daily living, pain with passive range of motion, crepitus and joint swelling.  Patient has evidence of joint space narrowing by imaging studies. There is no active infection.  Patient Active Problem List   Diagnosis Date Noted  . Left radial head fracture 04/04/2015   Past Medical History:  Diagnosis Date  . Anxiety   . Arthritis   . Complication of anesthesia   . GERD (gastroesophageal reflux disease)    OCC  . Headache   . Nerve pain   . Pericardial cyst   . PONV (postoperative nausea and vomiting)    Last surgery 06/2014- nausea not vomitting was pre medicated.  Marland Kitchen PSVT (paroxysmal supraventricular tachycardia) (HCC)     Past Surgical History:  Procedure Laterality Date  . ABDOMINAL HYSTERECTOMY    . ANTERIOR CRUCIATE LIGAMENT REPAIR Left 1998   Menicus, PCL Screw  . CHOLECYSTECTOMY    . CHONDROPLASTY Right 04/10/2017   Procedure: CHONDROPLASTY;  Surgeon: Gean Birchwood, MD;  Location: [facility];  Service: Orthopedics;  Laterality: Right;  . KNEE ARTHROSCOPY Right    Menicus  . KNEE ARTHROSCOPY Right 04/10/2017   Procedure: ARTHROSCOPY KNEE;  Surgeon: Gean Birchwood, MD;  Location: [facility];  Service: Orthopedics;  Laterality: Right;  . KNEE ARTHROSCOPY WITH LATERAL MENISECTOMY Right 04/10/2017   Procedure: KNEE ARTHROSCOPY WITH  PARTIAL LATERAL MENISECTOMY;  Surgeon: Gean Birchwood, MD;  Location: [facility];  Service: Orthopedics;  Laterality: Right;  . LAPAROSCOPIC TOTAL HYSTERECTOMY  07/04/14  . ORIF ELBOW FRACTURE Left 04/04/2015   Procedure: OPEN REDUCTION INTERNAL FIXATION (ORIF) LEFT ELBOW/OLECRANON FRACTURE;  Surgeon: Bradly Bienenstock, MD;  Location: [facility];  Service: Orthopedics;  Laterality: Left;  . TONSILLECTOMY      Current Facility-Administered Medications  Medication Dose Route Frequency Provider Last Rate Last Dose  . [START ON 12/07/2017] tranexamic acid (CYKLOKAPRON) 2,000 mg in sodium chloride 0.9 % 50 mL Topical Application  2,000 mg Topical To OR Gean Birchwood, MD      . Melene Muller ON 12/07/2017] vancomycin (VANCOCIN) 1,500 mg in sodium chloride 0.9 % 500 mL IVPB  1,500 mg Intravenous On Call to OR Gean Birchwood, MD       Current Outpatient Medications  Medication Sig Dispense Refill Last Dose  . ALPRAZolam (XANAX) 0.5 MG tablet Take 0.5 mg by mouth 2 (two) times daily as needed for anxiety.    04/10/2017 at Unknown time  . buPROPion (WELLBUTRIN XL) 150 MG 24 hr tablet Take 150 mg by mouth daily.     . diazepam (VALIUM) 5 MG tablet Take 5 mg by mouth at  bedtime as needed for muscle spasms.    04/09/2017 at Unknown time  . DULoxetine (CYMBALTA) 60 MG capsule Take 60 mg by mouth daily.    04/09/2017 at Unknown time  . gabapentin (NEURONTIN) 300 MG capsule Take 300 mg by mouth 2 (two) times daily.   04/09/2017 at Unknown time  . ibuprofen (ADVIL,MOTRIN) 200 MG tablet Take 800 mg by mouth every 6 (six) hours as needed for headache or moderate  pain.     . methocarbamol (ROBAXIN) 750 MG tablet Take 750 mg by mouth 2 (two) times daily as needed for muscle spasms.     . Multiple Vitamins-Minerals (MULTIVITAMIN PO) Take 1 packet by mouth daily. Essential Oil Supplement Packet - 8 tablets total     . traMADol (ULTRAM) 50 MG tablet Take 50 mg by mouth See admin instructions. Take 50 mg by mouth up to 5 times daily as needed for pain     . albuterol (PROVENTIL HFA;VENTOLIN HFA) 108 (90 Base) MCG/ACT inhaler Inhale 2 puffs into the lungs every 6 (six) hours as needed for wheezing or shortness of breath. (Patient not taking: Reported on 11/25/2017) 1 Inhaler 2 Completed Course at Unknown time  . chlorpheniramine-HYDROcodone (TUSSIONEX PENNKINETIC ER) 10-8 MG/5ML SUER Take 5 mLs by mouth at bedtime as needed for cough. (Patient not taking: Reported on 11/25/2017) 40 mL 0 Completed Course at Unknown time  . oxyCODONE-acetaminophen (ROXICET) 5-325 MG tablet Take 1 tablet by mouth every 6 (six) hours as needed. (Patient not taking: Reported on 11/25/2017) 15 tablet 0 Completed Course at Unknown time  . predniSONE (DELTASONE) 10 MG tablet Take 1 tablet (10 mg total) by mouth daily. Day 1-2: Take 50 mg  ( 5 pills) Day 3-4 : Take 40 mg (4pills) Day 5-6: Take 30 mg (3 pills) Day 7-8:  Take 20 mg (2 pills) Day 9:  Take 10mg  (1 pill) (Patient not taking: Reported on 11/25/2017) 29 tablet 0 Completed Course at Unknown time  . prochlorperazine (COMPAZINE) 10 MG tablet Take 1 tablet (10 mg total) by mouth every 6 (six) hours as needed for nausea. (Patient not taking: Reported on 11/25/2017) 30 tablet 1 Completed Course at Unknown time  . promethazine (PHENERGAN) 12.5 MG tablet Take 1 tablet (12.5 mg total) by mouth every 6 (six) hours as needed for nausea or vomiting. (Patient not taking: Reported on 11/25/2017) 12 tablet 0 Completed Course at Unknown time   Allergies  Allergen Reactions  . Augmentin [Amoxicillin-Pot Clavulanate] Rash and Other (See Comments)    Can take  any other PCNs  . Vicodin [Hydrocodone-Acetaminophen] Nausea And Vomiting, Rash and Nausea Only  . Codeine Sulfate Nausea And Vomiting  . Hydrocodone-Acetaminophen Nausea Only  . Hydrocodone-Homatropine Other (See Comments)  . Penicillin G Other (See Comments)    Social History   Tobacco Use  . Smoking status: Former Smoker    Years: 10.00  . Smokeless tobacco: Never Used  . Tobacco comment: Quit 2006  Substance Use Topics  . Alcohol use: Yes    Comment: OCC    No family history on file.   Review of Systems  Constitutional: Negative.   HENT: Positive for sinus pain and tinnitus.   Eyes: Negative.   Respiratory: Negative.   Cardiovascular: Negative.   Gastrointestinal: Negative.   Genitourinary: Negative.   Musculoskeletal: Positive for joint pain.  Skin: Negative.   Neurological: Positive for focal weakness and headaches.  Endo/Heme/Allergies: Negative.   Psychiatric/Behavioral: Positive for depression.    Objective:  Physical  Exam  Constitutional: She is oriented to person, place, and time. She appears well-developed and well-nourished.  HENT:  Head: Normocephalic and atraumatic.  Eyes: Pupils are equal, round, and reactive to light.  Neck: Normal range of motion. Neck supple.  Cardiovascular: Intact distal pulses.  Respiratory: Effort normal.  Musculoskeletal: She exhibits tenderness.  Patient has a mild effusion.  No instability.  Calves are soft and nontender.  She is neurovascular intact distally.  Neurological: She is alert and oriented to person, place, and time.  Skin: Skin is warm and dry.  Psychiatric: She has a normal mood and affect. Her behavior is normal. Judgment and thought content normal.    Vital signs in last 24 hours:    Labs:   Estimated body mass index is 34.11 kg/m as calculated from the following:   Height as of 11/26/17: 5\' 9"  (1.753 m).   Weight as of 11/26/17: 104.8 kg (231 lb).   Imaging Review Plain radiographs demonstrate  moderate arthritis.  The femorotibial angle is 9, normal being up to 7.  I also reviewed the photographs from surgery showing grade II chondromalacia of the lateral compartment.  Assessment/Plan:  End stage arthritis, right knee   The patient history, physical examination, clinical judgment of the provider and imaging studies are consistent with end stage degenerative joint disease of the right knee(s) and total knee arthroplasty is deemed medically necessary. The treatment options including medical management, injection therapy arthroscopy and arthroplasty were discussed at length. The risks and benefits of total knee arthroplasty were presented and reviewed. The risks due to aseptic loosening, infection, stiffness, patella tracking problems, thromboembolic complications and other imponderables were discussed. The patient acknowledged the explanation, agreed to proceed with the plan and consent was signed. Patient is being admitted for inpatient treatment for surgery, pain control, PT, OT, prophylactic antibiotics, VTE prophylaxis, progressive ambulation and ADL's and discharge planning. The patient is planning to be discharged home with home health services.

## 2017-12-07 ENCOUNTER — Inpatient Hospital Stay (HOSPITAL_COMMUNITY)
Admission: RE | Admit: 2017-12-07 | Discharge: 2017-12-09 | DRG: 470 | Disposition: A | Discharge status: Home Health | Payer: PRIVATE HEALTH INSURANCE | Source: Ambulatory Visit | Attending: Orthopedic Surgery | Admitting: Orthopedic Surgery

## 2017-12-07 ENCOUNTER — Inpatient Hospital Stay (HOSPITAL_COMMUNITY): Payer: PRIVATE HEALTH INSURANCE | Admitting: Vascular Surgery

## 2017-12-07 ENCOUNTER — Encounter (HOSPITAL_COMMUNITY)
Admission: RE | Disposition: A | Discharge status: Home Health | Payer: Self-pay | Source: Ambulatory Visit | Attending: Orthopedic Surgery

## 2017-12-07 ENCOUNTER — Inpatient Hospital Stay (HOSPITAL_COMMUNITY): Payer: PRIVATE HEALTH INSURANCE | Admitting: Anesthesiology

## 2017-12-07 ENCOUNTER — Encounter (HOSPITAL_COMMUNITY): Payer: Self-pay | Admitting: *Deleted

## 2017-12-07 DIAGNOSIS — M1611 Unilateral primary osteoarthritis, right hip: Secondary | ICD-10-CM | POA: Diagnosis present

## 2017-12-07 DIAGNOSIS — Z6834 Body mass index (BMI) 34.0-34.9, adult: Secondary | ICD-10-CM | POA: Diagnosis not present

## 2017-12-07 DIAGNOSIS — F419 Anxiety disorder, unspecified: Secondary | ICD-10-CM | POA: Diagnosis present

## 2017-12-07 DIAGNOSIS — Z881 Allergy status to other antibiotic agents status: Secondary | ICD-10-CM

## 2017-12-07 DIAGNOSIS — D62 Acute posthemorrhagic anemia: Secondary | ICD-10-CM | POA: Diagnosis not present

## 2017-12-07 DIAGNOSIS — Z885 Allergy status to narcotic agent status: Secondary | ICD-10-CM | POA: Diagnosis not present

## 2017-12-07 DIAGNOSIS — M1711 Unilateral primary osteoarthritis, right knee: Principal | ICD-10-CM | POA: Diagnosis present

## 2017-12-07 DIAGNOSIS — M25561 Pain in right knee: Secondary | ICD-10-CM | POA: Diagnosis present

## 2017-12-07 DIAGNOSIS — Z87891 Personal history of nicotine dependence: Secondary | ICD-10-CM | POA: Diagnosis not present

## 2017-12-07 DIAGNOSIS — E6609 Other obesity due to excess calories: Secondary | ICD-10-CM | POA: Diagnosis present

## 2017-12-07 DIAGNOSIS — Z880 Allergy status to penicillin: Secondary | ICD-10-CM | POA: Diagnosis not present

## 2017-12-07 DIAGNOSIS — K219 Gastro-esophageal reflux disease without esophagitis: Secondary | ICD-10-CM | POA: Diagnosis present

## 2017-12-07 HISTORY — PX: TOTAL KNEE ARTHROPLASTY: SHX125

## 2017-12-07 LAB — CBC
HEMATOCRIT: 38.1 % (ref 36.0–46.0)
Hemoglobin: 12.9 g/dL (ref 12.0–15.0)
MCH: 29.9 pg (ref 26.0–34.0)
MCHC: 33.9 g/dL (ref 30.0–36.0)
MCV: 88.4 fL (ref 78.0–100.0)
PLATELETS: 246 10*3/uL (ref 150–400)
RBC: 4.31 MIL/uL (ref 3.87–5.11)
RDW: 11.8 % (ref 11.5–15.5)
WBC: 7.3 10*3/uL (ref 4.0–10.5)

## 2017-12-07 LAB — DIFFERENTIAL
BASOS ABS: 0 10*3/uL (ref 0.0–0.1)
Basophils Relative: 0 %
Eosinophils Absolute: 0.1 10*3/uL (ref 0.0–0.7)
Eosinophils Relative: 2 %
LYMPHS PCT: 33 %
Lymphs Abs: 2.4 10*3/uL (ref 0.7–4.0)
MONO ABS: 0.5 10*3/uL (ref 0.1–1.0)
MONOS PCT: 7 %
NEUTROS ABS: 4.2 10*3/uL (ref 1.7–7.7)
Neutrophils Relative %: 58 %

## 2017-12-07 LAB — APTT: APTT: 29 s (ref 24–36)

## 2017-12-07 LAB — PROTIME-INR
INR: 0.99
Prothrombin Time: 13 seconds (ref 11.4–15.2)

## 2017-12-07 LAB — ABO/RH: ABO/RH(D): O POS

## 2017-12-07 LAB — TYPE AND SCREEN
ABO/RH(D): O POS
ANTIBODY SCREEN: NEGATIVE

## 2017-12-07 SURGERY — ARTHROPLASTY, KNEE, TOTAL
Anesthesia: Spinal | Site: Knee | Laterality: Right

## 2017-12-07 MED ORDER — SENNOSIDES-DOCUSATE SODIUM 8.6-50 MG PO TABS: 1.0000 | ORAL_TABLET | Freq: Every evening | ORAL | Status: DC | PRN

## 2017-12-07 MED ORDER — MENTHOL 3 MG MT LOZG
1.0000 | LOZENGE | OROMUCOSAL | Status: DC | PRN
Start: 2017-12-07 — End: 2017-12-09

## 2017-12-07 MED ORDER — TRANEXAMIC ACID 1000 MG/10ML IV SOLN
1000.0000 mg | INTRAVENOUS | Status: AC
  Administered 2017-12-07: 1000 mg via INTRAVENOUS
  Filled 2017-12-07: qty 1100

## 2017-12-07 MED ORDER — PHENYLEPHRINE 40 MCG/ML (10ML) SYRINGE FOR IV PUSH (FOR BLOOD PRESSURE SUPPORT)
PREFILLED_SYRINGE | INTRAVENOUS | Status: AC
  Filled 2017-12-07: qty 10

## 2017-12-07 MED ORDER — ONDANSETRON HCL 4 MG/2ML IJ SOLN: 4.0000 mg | Freq: Once | INTRAMUSCULAR | Status: DC | PRN

## 2017-12-07 MED ORDER — ACETAMINOPHEN 325 MG PO TABS
650.0000 mg | ORAL_TABLET | ORAL | Status: DC | PRN
  Administered 2017-12-07 – 2017-12-09 (×2): 650 mg via ORAL
  Filled 2017-12-07 (×2): qty 2

## 2017-12-07 MED ORDER — BUPIVACAINE-EPINEPHRINE 0.5% -1:200000 IJ SOLN
INTRAMUSCULAR | Status: DC | PRN
  Administered 2017-12-07: 50 mL

## 2017-12-07 MED ORDER — DIAZEPAM 5 MG PO TABS
5.0000 mg | ORAL_TABLET | Freq: Every evening | ORAL | Status: DC | PRN
  Administered 2017-12-08 – 2017-12-09 (×2): 5 mg via ORAL
  Filled 2017-12-07 (×2): qty 1

## 2017-12-07 MED ORDER — CELECOXIB 200 MG PO CAPS
200.0000 mg | ORAL_CAPSULE | Freq: Two times a day (BID) | ORAL | Status: DC
  Administered 2017-12-07 – 2017-12-09 (×4): 200 mg via ORAL
  Filled 2017-12-07 (×4): qty 1

## 2017-12-07 MED ORDER — PHENOL 1.4 % MT LIQD: 1.0000 | OROMUCOSAL | Status: DC | PRN

## 2017-12-07 MED ORDER — ALUM & MAG HYDROXIDE-SIMETH 200-200-20 MG/5ML PO SUSP: 30.0000 mL | ORAL | Status: DC | PRN

## 2017-12-07 MED ORDER — IBUPROFEN 200 MG PO TABS
800.0000 mg | ORAL_TABLET | Freq: Four times a day (QID) | ORAL | Status: DC | PRN
  Administered 2017-12-07 – 2017-12-08 (×3): 800 mg via ORAL
  Filled 2017-12-07 (×2): qty 4
  Filled 2017-12-07 (×2): qty 1
  Filled 2017-12-07: qty 4

## 2017-12-07 MED ORDER — METHOCARBAMOL 500 MG PO TABS
500.0000 mg | ORAL_TABLET | Freq: Four times a day (QID) | ORAL | Status: DC | PRN
  Administered 2017-12-07 – 2017-12-09 (×5): 500 mg via ORAL
  Filled 2017-12-07 (×5): qty 1

## 2017-12-07 MED ORDER — DEXAMETHASONE SODIUM PHOSPHATE 10 MG/ML IJ SOLN
10.0000 mg | Freq: Once | INTRAMUSCULAR | Status: AC
  Administered 2017-12-08: 10 mg via INTRAVENOUS
  Filled 2017-12-07 (×2): qty 1

## 2017-12-07 MED ORDER — MIDAZOLAM HCL 2 MG/2ML IJ SOLN
1.0000 mg | INTRAMUSCULAR | Status: DC | PRN
  Administered 2017-12-07: 2 mg via INTRAVENOUS

## 2017-12-07 MED ORDER — TRANEXAMIC ACID 1000 MG/10ML IV SOLN
1000.0000 mg | Freq: Once | INTRAVENOUS | Status: AC
  Administered 2017-12-07: 1000 mg via INTRAVENOUS
  Filled 2017-12-07: qty 10

## 2017-12-07 MED ORDER — METOCLOPRAMIDE HCL 5 MG PO TABS: 5.0000 mg | ORAL_TABLET | Freq: Three times a day (TID) | ORAL | Status: DC | PRN

## 2017-12-07 MED ORDER — PROPOFOL 10 MG/ML IV BOLUS
INTRAVENOUS | Status: AC
  Filled 2017-12-07: qty 20

## 2017-12-07 MED ORDER — FENTANYL CITRATE (PF) 100 MCG/2ML IJ SOLN
INTRAMUSCULAR | Status: AC
  Administered 2017-12-07: 100 ug via INTRAVENOUS
  Filled 2017-12-07: qty 2

## 2017-12-07 MED ORDER — METOCLOPRAMIDE HCL 5 MG/ML IJ SOLN: 5.0000 mg | Freq: Three times a day (TID) | INTRAMUSCULAR | Status: DC | PRN

## 2017-12-07 MED ORDER — METHOCARBAMOL 750 MG PO TABS: 750.0000 mg | ORAL_TABLET | Freq: Four times a day (QID) | ORAL | 0 refills | Status: DC | PRN

## 2017-12-07 MED ORDER — ASPIRIN EC 325 MG PO TBEC
325.0000 mg | DELAYED_RELEASE_TABLET | Freq: Every day | ORAL | Status: DC
  Administered 2017-12-08 – 2017-12-09 (×2): 325 mg via ORAL
  Filled 2017-12-07 (×2): qty 1

## 2017-12-07 MED ORDER — PROPOFOL 500 MG/50ML IV EMUL
INTRAVENOUS | Status: DC | PRN
  Administered 2017-12-07: 100 ug/kg/min via INTRAVENOUS

## 2017-12-07 MED ORDER — DOCUSATE SODIUM 100 MG PO CAPS
100.0000 mg | ORAL_CAPSULE | Freq: Two times a day (BID) | ORAL | Status: DC
  Administered 2017-12-08 – 2017-12-09 (×3): 100 mg via ORAL
  Filled 2017-12-07 (×4): qty 1

## 2017-12-07 MED ORDER — ALPRAZOLAM 0.5 MG PO TABS: 0.5000 mg | ORAL_TABLET | Freq: Two times a day (BID) | ORAL | Status: DC | PRN

## 2017-12-07 MED ORDER — LACTATED RINGERS IV SOLN
INTRAVENOUS | Status: DC
  Administered 2017-12-07: 09:00:00 via INTRAVENOUS

## 2017-12-07 MED ORDER — SODIUM CHLORIDE 0.9 % IJ SOLN
INTRAMUSCULAR | Status: DC | PRN
  Administered 2017-12-07: 50 mL

## 2017-12-07 MED ORDER — ACETAMINOPHEN 650 MG RE SUPP: 650.0000 mg | RECTAL | Status: DC | PRN

## 2017-12-07 MED ORDER — CHLORHEXIDINE GLUCONATE 4 % EX LIQD: 60.0000 mL | Freq: Once | CUTANEOUS | Status: DC

## 2017-12-07 MED ORDER — LACTATED RINGERS IV SOLN
INTRAVENOUS | Status: DC | PRN
  Administered 2017-12-07 (×2): via INTRAVENOUS

## 2017-12-07 MED ORDER — BISACODYL 5 MG PO TBEC: 5.0000 mg | DELAYED_RELEASE_TABLET | Freq: Every day | ORAL | Status: DC | PRN

## 2017-12-07 MED ORDER — OXYCODONE HCL 5 MG PO TABS: 5.0000 mg | ORAL_TABLET | ORAL | Status: DC | PRN

## 2017-12-07 MED ORDER — MEPERIDINE HCL 25 MG/ML IJ SOLN: 6.2500 mg | INTRAMUSCULAR | Status: DC | PRN

## 2017-12-07 MED ORDER — ONDANSETRON HCL 4 MG PO TABS: 4.0000 mg | ORAL_TABLET | Freq: Four times a day (QID) | ORAL | Status: DC | PRN

## 2017-12-07 MED ORDER — ONDANSETRON HCL 4 MG/2ML IJ SOLN: 4.0000 mg | Freq: Four times a day (QID) | INTRAMUSCULAR | Status: DC | PRN

## 2017-12-07 MED ORDER — PREDNISONE 10 MG PO TABS: 10.0000 mg | ORAL_TABLET | Freq: Every day | ORAL | Status: DC

## 2017-12-07 MED ORDER — ONDANSETRON HCL 4 MG/2ML IJ SOLN
INTRAMUSCULAR | Status: DC | PRN
  Administered 2017-12-07: 4 mg via INTRAVENOUS

## 2017-12-07 MED ORDER — BUPIVACAINE LIPOSOME 1.3 % IJ SUSP
20.0000 mL | Freq: Once | INTRAMUSCULAR | Status: DC
  Filled 2017-12-07: qty 20

## 2017-12-07 MED ORDER — BUPIVACAINE LIPOSOME 1.3 % IJ SUSP
INTRAMUSCULAR | Status: DC | PRN
  Administered 2017-12-07: 20 mL

## 2017-12-07 MED ORDER — OXYCODONE-ACETAMINOPHEN 5-325 MG PO TABS: 1.0000 | ORAL_TABLET | ORAL | 0 refills | Status: DC | PRN

## 2017-12-07 MED ORDER — BUPIVACAINE-EPINEPHRINE (PF) 0.5% -1:200000 IJ SOLN
INTRAMUSCULAR | Status: AC
  Filled 2017-12-07: qty 30

## 2017-12-07 MED ORDER — MIDAZOLAM HCL 2 MG/2ML IJ SOLN
INTRAMUSCULAR | Status: AC
  Filled 2017-12-07: qty 2

## 2017-12-07 MED ORDER — CEFUROXIME SODIUM 1.5 G IV SOLR
INTRAVENOUS | Status: AC
  Filled 2017-12-07: qty 1.5

## 2017-12-07 MED ORDER — FLEET ENEMA 7-19 GM/118ML RE ENEM: 1.0000 | ENEMA | Freq: Once | RECTAL | Status: DC | PRN

## 2017-12-07 MED ORDER — DIPHENHYDRAMINE HCL 12.5 MG/5ML PO ELIX: 12.5000 mg | ORAL_SOLUTION | ORAL | Status: DC | PRN

## 2017-12-07 MED ORDER — SODIUM CHLORIDE 0.9 % IR SOLN
Status: DC | PRN
  Administered 2017-12-07: 3000 mL

## 2017-12-07 MED ORDER — SODIUM CHLORIDE 0.9 % IJ SOLN: INTRAMUSCULAR | Status: DC | PRN

## 2017-12-07 MED ORDER — HYDROMORPHONE HCL 1 MG/ML IJ SOLN
0.5000 mg | INTRAMUSCULAR | Status: DC | PRN
  Administered 2017-12-07 – 2017-12-09 (×14): 0.5 mg via INTRAVENOUS
  Filled 2017-12-07 (×14): qty 1

## 2017-12-07 MED ORDER — HYDROMORPHONE HCL 1 MG/ML IJ SOLN: 0.2500 mg | INTRAMUSCULAR | Status: DC | PRN

## 2017-12-07 MED ORDER — BUPIVACAINE IN DEXTROSE 0.75-8.25 % IT SOLN
INTRATHECAL | Status: DC | PRN
Start: 2017-12-07 — End: 2017-12-07
  Administered 2017-12-07: 2 mL via INTRATHECAL

## 2017-12-07 MED ORDER — FENTANYL CITRATE (PF) 250 MCG/5ML IJ SOLN
INTRAMUSCULAR | Status: AC
  Filled 2017-12-07: qty 5

## 2017-12-07 MED ORDER — ALBUTEROL SULFATE (2.5 MG/3ML) 0.083% IN NEBU: 2.5000 mg | INHALATION_SOLUTION | Freq: Four times a day (QID) | RESPIRATORY_TRACT | Status: DC | PRN

## 2017-12-07 MED ORDER — GABAPENTIN 300 MG PO CAPS
300.0000 mg | ORAL_CAPSULE | Freq: Three times a day (TID) | ORAL | Status: DC
  Administered 2017-12-07 – 2017-12-09 (×7): 300 mg via ORAL
  Filled 2017-12-07 (×7): qty 1

## 2017-12-07 MED ORDER — TRANEXAMIC ACID 1000 MG/10ML IV SOLN
2000.0000 mg | INTRAVENOUS | Status: DC
  Filled 2017-12-07: qty 20

## 2017-12-07 MED ORDER — BUPROPION HCL ER (XL) 150 MG PO TB24
150.0000 mg | ORAL_TABLET | Freq: Every day | ORAL | Status: DC
  Administered 2017-12-07 – 2017-12-09 (×3): 150 mg via ORAL
  Filled 2017-12-07 (×3): qty 1

## 2017-12-07 MED ORDER — PROPOFOL 10 MG/ML IV BOLUS
INTRAVENOUS | Status: AC
Start: 2017-12-07 — End: 2017-12-07
  Filled 2017-12-07: qty 20

## 2017-12-07 MED ORDER — MIDAZOLAM HCL 2 MG/2ML IJ SOLN
INTRAMUSCULAR | Status: AC
  Administered 2017-12-07: 2 mg via INTRAVENOUS
  Filled 2017-12-07: qty 2

## 2017-12-07 MED ORDER — PROPOFOL 10 MG/ML IV BOLUS
INTRAVENOUS | Status: DC | PRN
  Administered 2017-12-07: 20 mg via INTRAVENOUS

## 2017-12-07 MED ORDER — OXYCODONE HCL 5 MG PO TABS
10.0000 mg | ORAL_TABLET | ORAL | Status: DC | PRN
  Administered 2017-12-07 – 2017-12-09 (×14): 10 mg via ORAL
  Filled 2017-12-07 (×14): qty 2

## 2017-12-07 MED ORDER — DEXTROSE 5 % IV SOLN: 500.0000 mg | Freq: Four times a day (QID) | INTRAVENOUS | Status: DC | PRN

## 2017-12-07 MED ORDER — DULOXETINE HCL 60 MG PO CPEP
60.0000 mg | ORAL_CAPSULE | Freq: Every day | ORAL | Status: DC
  Administered 2017-12-07 – 2017-12-09 (×3): 60 mg via ORAL
  Filled 2017-12-07 (×3): qty 1

## 2017-12-07 MED ORDER — ASPIRIN EC 325 MG PO TBEC: 325.0000 mg | DELAYED_RELEASE_TABLET | Freq: Two times a day (BID) | ORAL | 0 refills | Status: DC

## 2017-12-07 MED ORDER — FENTANYL CITRATE (PF) 100 MCG/2ML IJ SOLN
50.0000 ug | INTRAMUSCULAR | Status: DC | PRN
  Administered 2017-12-07: 100 ug via INTRAVENOUS

## 2017-12-07 MED ORDER — METHOCARBAMOL 750 MG PO TABS
750.0000 mg | ORAL_TABLET | Freq: Two times a day (BID) | ORAL | Status: DC | PRN
  Administered 2017-12-08: 750 mg via ORAL
  Filled 2017-12-07 (×2): qty 1

## 2017-12-07 MED ORDER — PROMETHAZINE HCL 25 MG PO TABS
12.5000 mg | ORAL_TABLET | Freq: Four times a day (QID) | ORAL | Status: DC | PRN
Start: 2017-12-07 — End: 2017-12-07

## 2017-12-07 MED ORDER — MIDAZOLAM HCL 5 MG/5ML IJ SOLN
INTRAMUSCULAR | Status: DC | PRN
  Administered 2017-12-07: 2 mg via INTRAVENOUS

## 2017-12-07 MED ORDER — PROCHLORPERAZINE MALEATE 10 MG PO TABS
10.0000 mg | ORAL_TABLET | Freq: Four times a day (QID) | ORAL | Status: DC | PRN
  Filled 2017-12-07: qty 1

## 2017-12-07 MED ORDER — KCL IN DEXTROSE-NACL 20-5-0.45 MEQ/L-%-% IV SOLN
INTRAVENOUS | Status: DC
  Administered 2017-12-07: 18:00:00 via INTRAVENOUS
  Filled 2017-12-07: qty 1000

## 2017-12-07 MED ORDER — ROPIVACAINE HCL 7.5 MG/ML IJ SOLN
INTRAMUSCULAR | Status: DC | PRN
  Administered 2017-12-07: 20 mL via PERINEURAL

## 2017-12-07 SURGICAL SUPPLY — 55 items
BANDAGE ESMARK 6X9 LF (GAUZE/BANDAGES/DRESSINGS) ×1 IMPLANT
BLADE SAG 18X100X1.27 (BLADE) ×2 IMPLANT
BLADE SAW SGTL 13X75X1.27 (BLADE) ×2 IMPLANT
BNDG ELASTIC 6X10 VLCR STRL LF (GAUZE/BANDAGES/DRESSINGS) ×2 IMPLANT
BNDG ESMARK 6X9 LF (GAUZE/BANDAGES/DRESSINGS) ×2
BOWL SMART MIX CTS (DISPOSABLE) ×2 IMPLANT
CAPT KNEE TOTAL 3 ATTUNE ×2 IMPLANT
CEMENT HV SMART SET (Cement) ×4 IMPLANT
COVER SURGICAL LIGHT HANDLE (MISCELLANEOUS) ×2 IMPLANT
CUFF TOURNIQUET SINGLE 34IN LL (TOURNIQUET CUFF) ×2 IMPLANT
DRAPE EXTREMITY T 121X128X90 (DRAPE) ×2 IMPLANT
DRAPE HALF SHEET 40X57 (DRAPES) ×2 IMPLANT
DRAPE PROXIMA HALF (DRAPES) ×2 IMPLANT
DRAPE U-SHAPE 47X51 STRL (DRAPES) ×2 IMPLANT
DRSG AQUACEL AG ADV 3.5X10 (GAUZE/BANDAGES/DRESSINGS) ×2 IMPLANT
DURAPREP 26ML APPLICATOR (WOUND CARE) ×2 IMPLANT
ELECT REM PT RETURN 9FT ADLT (ELECTROSURGICAL) ×2
ELECTRODE REM PT RTRN 9FT ADLT (ELECTROSURGICAL) ×1 IMPLANT
GLOVE BIO SURGEON STRL SZ7.5 (GLOVE) ×2 IMPLANT
GLOVE BIO SURGEON STRL SZ8.5 (GLOVE) ×2 IMPLANT
GLOVE BIOGEL PI IND STRL 6.5 (GLOVE) ×1 IMPLANT
GLOVE BIOGEL PI IND STRL 7.0 (GLOVE) ×1 IMPLANT
GLOVE BIOGEL PI IND STRL 8 (GLOVE) ×1 IMPLANT
GLOVE BIOGEL PI IND STRL 9 (GLOVE) ×1 IMPLANT
GLOVE BIOGEL PI INDICATOR 6.5 (GLOVE) ×1
GLOVE BIOGEL PI INDICATOR 7.0 (GLOVE) ×1
GLOVE BIOGEL PI INDICATOR 8 (GLOVE) ×1
GLOVE BIOGEL PI INDICATOR 9 (GLOVE) ×1
GLOVE SURG SS PI 7.0 STRL IVOR (GLOVE) ×2 IMPLANT
GOWN STRL REUS W/ TWL LRG LVL3 (GOWN DISPOSABLE) ×1 IMPLANT
GOWN STRL REUS W/ TWL XL LVL3 (GOWN DISPOSABLE) ×2 IMPLANT
GOWN STRL REUS W/TWL LRG LVL3 (GOWN DISPOSABLE) ×1
GOWN STRL REUS W/TWL XL LVL3 (GOWN DISPOSABLE) ×2
HANDPIECE INTERPULSE COAX TIP (DISPOSABLE) ×1
HOOD PEEL AWAY FACE SHEILD DIS (HOOD) ×4 IMPLANT
KIT BASIN OR (CUSTOM PROCEDURE TRAY) ×2 IMPLANT
KIT ROOM TURNOVER OR (KITS) ×2 IMPLANT
MANIFOLD NEPTUNE II (INSTRUMENTS) ×2 IMPLANT
NEEDLE 22X1 1/2 (OR ONLY) (NEEDLE) ×4 IMPLANT
NS IRRIG 1000ML POUR BTL (IV SOLUTION) ×2 IMPLANT
PACK TOTAL JOINT (CUSTOM PROCEDURE TRAY) ×2 IMPLANT
PAD ARMBOARD 7.5X6 YLW CONV (MISCELLANEOUS) ×4 IMPLANT
SET HNDPC FAN SPRY TIP SCT (DISPOSABLE) ×1 IMPLANT
SUT VIC AB 0 CT1 27 (SUTURE) ×1
SUT VIC AB 0 CT1 27XBRD ANBCTR (SUTURE) ×1 IMPLANT
SUT VIC AB 1 CTX 36 (SUTURE) ×1
SUT VIC AB 1 CTX36XBRD ANBCTR (SUTURE) ×1 IMPLANT
SUT VIC AB 2-0 CT1 27 (SUTURE) ×1
SUT VIC AB 2-0 CT1 TAPERPNT 27 (SUTURE) ×1 IMPLANT
SUT VIC AB 3-0 CT1 27 (SUTURE) ×1
SUT VIC AB 3-0 CT1 TAPERPNT 27 (SUTURE) ×1 IMPLANT
SYR CONTROL 10ML LL (SYRINGE) ×4 IMPLANT
TOWEL OR 17X24 6PK STRL BLUE (TOWEL DISPOSABLE) ×2 IMPLANT
TOWEL OR 17X26 10 PK STRL BLUE (TOWEL DISPOSABLE) ×2 IMPLANT
TRAY CATH 16FR W/PLASTIC CATH (SET/KITS/TRAYS/PACK) IMPLANT

## 2017-12-07 NOTE — Anesthesia Postprocedure Evaluation (Signed)
Anesthesia Post Note  Patient: Brenda Sellers  Procedure(s) Performed: RIGHT TOTAL KNEE ARTHROPLASTY (Right Knee)     Patient location during evaluation: PACU Anesthesia Type: Spinal Level of consciousness: oriented and awake and alert Pain management: pain level controlled Vital Signs Assessment: post-procedure vital signs reviewed and stable Respiratory status: spontaneous breathing, respiratory function stable and patient connected to nasal cannula oxygen Cardiovascular status: blood pressure returned to baseline and stable Postop Assessment: no headache, no backache and no apparent nausea or vomiting Anesthetic complications: no    Last Vitals:  Vitals:   12/07/17 1221 12/07/17 1240  BP:  109/65  Pulse:  98  Resp:    Temp: 36.7 C 36.5 C  SpO2:  100%    Last Pain:  Vitals:   12/07/17 1240  TempSrc: Oral  PainSc:                  Giovanni Biby DAVID

## 2017-12-07 NOTE — Transfer of Care (Signed)
Immediate Anesthesia Transfer of Care Note  Patient: Brenda Sellers  Procedure(s) Performed: RIGHT TOTAL KNEE ARTHROPLASTY (Right Knee)  Patient Location: PACU  Anesthesia Type:Spinal  Level of Consciousness: awake, alert  and oriented  Airway & Oxygen Therapy: Patient Spontanous Breathing and Patient connected to nasal cannula oxygen  Post-op Assessment: Report given to RN and Post -op Vital signs reviewed and stable  Post vital signs: Reviewed and stable  Last Vitals:  Vitals:   12/07/17 0905 12/07/17 1104  BP: 128/77   Pulse: 88   Resp: 15   Temp:  (P) 36.4 C  SpO2: 100%     Last Pain:  Vitals:   12/07/17 0752  TempSrc: Oral  PainSc:       Patients Stated Pain Goal: 3 (12/07/17 0736)  Complications: No apparent anesthesia complications

## 2017-12-07 NOTE — Anesthesia Preprocedure Evaluation (Signed)
Anesthesia Evaluation  Patient identified by MRN, date of birth, ID band Patient awake    Reviewed: Allergy & Precautions, NPO status , Patient's Chart, lab work & pertinent test results  History of Anesthesia Complications (+) PONV  Airway Mallampati: I  TM Distance: >3 FB Neck ROM: Full    Dental   Pulmonary former smoker,    Pulmonary exam normal        Cardiovascular Normal cardiovascular exam     Neuro/Psych Anxiety    GI/Hepatic GERD  Medicated and Controlled,  Endo/Other    Renal/GU      Musculoskeletal   Abdominal   Peds  Hematology   Anesthesia Other Findings   Reproductive/Obstetrics                             Anesthesia Physical Anesthesia Plan  ASA: II  Anesthesia Plan: Spinal   Post-op Pain Management:  Regional for Post-op pain   Induction: Intravenous  PONV Risk Score and Plan: 3 and Ondansetron, Dexamethasone, Midazolam and Treatment may vary due to age or medical condition  Airway Management Planned: Simple Face Mask  Additional Equipment:   Intra-op Plan:   Post-operative Plan:   Informed Consent: I have reviewed the patients History and Physical, chart, labs and discussed the procedure including the risks, benefits and alternatives for the proposed anesthesia with the patient or authorized representative who has indicated his/her understanding and acceptance.     Plan Discussed with: CRNA and Surgeon  Anesthesia Plan Comments:         Anesthesia Quick Evaluation

## 2017-12-07 NOTE — Interval H&P Note (Signed)
History and Physical Interval Note:  12/07/2017 7:22 AM  Sampson SiStacy I Bells  has presented today for surgery, with the diagnosis of RIGHT KNEE OSTEOARTHRITIS  The various methods of treatment have been discussed with the patient and family. After consideration of risks, benefits and other options for treatment, the patient has consented to  Procedure(s): RIGHT TOTAL KNEE ARTHROPLASTY (Right) as a surgical intervention .  The patient's history has been reviewed, patient examined, no change in status, stable for surgery.  I have reviewed the patient's chart and labs.  Questions were answered to the patient's satisfaction.     Nestor LewandowskyFrank J Nathanel Tallman

## 2017-12-07 NOTE — Discharge Instructions (Signed)

## 2017-12-07 NOTE — Anesthesia Procedure Notes (Signed)
Spinal  Patient location during procedure: OR Start time: 12/07/2017 9:17 AM End time: 12/07/2017 9:20 AM Staffing Anesthesiologist: Arta Brucessey, Jalesia Loudenslager, MD Performed: anesthesiologist  Preanesthetic Checklist Completed: patient identified, surgical consent, pre-op evaluation, timeout performed, IV checked, risks and benefits discussed and monitors and equipment checked Spinal Block Patient position: sitting Prep: DuraPrep Patient monitoring: heart rate, cardiac monitor, continuous pulse ox and blood pressure Approach: right paramedian Location: L3-4 Injection technique: single-shot Needle Needle type: Pencan  Needle gauge: 24 G Needle length: 9 cm Needle insertion depth: 7 cm

## 2017-12-07 NOTE — Progress Notes (Signed)
Orthopedic Tech Progress Note Patient Details:  Brenda Sellers Rutigliano [birth year] [id number] [employer] [facility]  Patient ID: Brenda Sellers [place], female   DOB: [birth year], 37 y.o.   MRN: [id number]   Alvina ChouWilliams, Khalon Cansler C 12/07/2017, 7:09 PM

## 2017-12-07 NOTE — Evaluation (Signed)
Physical Therapy Evaluation Patient Details Name: DESIREE DAISE MRN: [id number] DOB: Aug 31, 1981 Today's Date: 12/07/2017   History of Present Illness  Pt is a 37 y/o female s/p elecitve R TKA. PMH includes anxiety, ACL repair, and pericardial cyst.   Clinical Impression  Pt is s/p surgery above with deficits below. PTA, pt was independent with functional mobility. Upon eval, pt very limited by post op pain. Also presenting with post op weakness and slightly decreased sensation in RLE. Distance limited to chair this session secondary to pain and required min to min guard assist with mobility. Reports her husband will be able to assist as needed upon d/c and has all necessary DME. Follow up recommendations per MD arrangements. Will continue to follow acutely to maximize functional mobility independence and safety.     Follow Up Recommendations DC plan and follow up therapy as arranged by surgeon;Supervision for mobility/OOB    Equipment Recommendations  None recommended by PT    Recommendations for Other Services OT consult     Precautions / Restrictions Precautions Precautions: Knee Precaution Booklet Issued: Yes (comment) Precaution Comments: Reviewed supine ther ex.  Restrictions Weight Bearing Restrictions: Yes RLE Weight Bearing: Weight bearing as tolerated      Mobility  Bed Mobility Overal bed mobility: Needs Assistance Bed Mobility: Supine to Sit     Supine to sit: Min assist     General bed mobility comments: Min A for RLE assist. Required increased time and use of bed rails and elevated HOB.   Transfers Overall transfer level: Needs assistance Equipment used: Rolling walker (2 wheeled) Transfers: Sit to/from [employer] Sit to Stand: Min assist Stand pivot transfers: Min guard       General transfer comment: Min A for lift assista nd steadying assist. Verbal cues for safe hand placment. Performed stand pivot to [facility] as pt in increased pain and  did not feel she could walk into the bathroom. Verbal cues for sequencing using RW.   Ambulation/Gait Ambulation/Gait assistance: Min guard Ambulation Distance (Feet): 5 Feet Assistive device: Rolling walker (2 wheeled) Gait Pattern/deviations: Step-to pattern;Decreased step length - right;Decreased step length - left;Decreased weight shift to right;Antalgic Gait velocity: Decreased Gait velocity interpretation: Below normal speed for age/gender General Gait Details: Slow, antalgic gait secondary to post op pain. Distance limited to chair from [facility], as pt reporting increased pain. RN in room and gave pain meds. Required cues for sequencing using RW.   Stairs            Wheelchair Mobility    Modified Rankin (Stroke Patients Only)       Balance Overall balance assessment: Needs assistance Sitting-balance support: No upper extremity supported;Feet supported Sitting balance-[name]: Good     Standing balance support: Bilateral upper extremity supported;During functional activity Standing balance-[name]: Poor Standing balance comment: Reliant on BUE support.                              Pertinent Vitals/Pain Pain Assessment: 0-10 Pain Score: 9  Pain Location: R knee  Pain Descriptors / Indicators: Constant;Shooting;Operative site guarding Pain Intervention(s): Limited activity within patient's tolerance;Monitored during session;Repositioned;RN gave pain meds during session;Patient requesting pain meds-RN notified    Home Living Family/patient expects to be discharged to:: Private residence Living Arrangements: Spouse/significant other;Children Available Help at Discharge: Family;Available 24 hours/day Type of Home: House Home Access: Stairs to enter Entrance Stairs-Rails: Right;Left;Can reach both Entrance Stairs-Number of Steps:  8 Home Layout: One level Home Equipment: Walker - 2 wheels;Bedside commode;Cane - single point      Prior Function  Level of Independence: Independent               Hand Dominance   Dominant Hand: Right    Extremity/Trunk Assessment   Upper Extremity Assessment Upper Extremity Assessment: Defer to OT evaluation    Lower Extremity Assessment Lower Extremity Assessment: RLE deficits/detail RLE Deficits / Details: Reports decreased sensation, however, able to feel some touch. Able to perform ther ex below. Deficits consistent with post op pain and weakness.     Cervical / Trunk Assessment Cervical / Trunk Assessment: Normal  Communication   Communication: No difficulties  Cognition Arousal/Alertness: Awake/alert Behavior During Therapy: WFL for tasks assessed/performed Overall Cognitive Status: Within Functional Limits for tasks assessed                                        General Comments General comments (skin integrity, edema, etc.): Pt's husband present during session.     Exercises Total Joint Exercises Ankle Circles/Pumps: AROM;Both;20 reps Quad Sets: AROM;Right;10 reps Towel Squeeze: AROM;Both;10 reps Heel Slides: AROM;Right;5 reps   Assessment/Plan    PT Assessment Patient needs continued PT services  PT Problem List Decreased strength;Decreased range of motion;Decreased activity tolerance;Decreased balance;Decreased mobility;Decreased knowledge of use of DME;Decreased knowledge of precautions;Pain       PT Treatment Interventions DME instruction;Functional mobility training;Stair training;Gait training;Therapeutic activities;Neuromuscular re-education;Therapeutic exercise;Balance training;Patient/family education    PT Goals (Current goals can be found in the Care Plan section)  Acute Rehab PT Goals Patient Stated Goal: to decrease pain  PT Goal Formulation: With patient Time For Goal Achievement: 12/21/17 Potential to Achieve Goals: Good    Frequency 7X/week   Barriers to discharge        Co-evaluation               AM-PAC PT "6  Clicks" Daily Activity  Outcome Measure Difficulty turning over in bed (including adjusting bedclothes, sheets and blankets)?: A Little Difficulty moving from lying on back to sitting on the side of the bed? : Unable Difficulty sitting down on and standing up from a chair with arms (e.g., wheelchair, bedside commode, etc,.)?: Unable Help needed moving to and from a bed to chair (including a wheelchair)?: A Little Help needed walking in hospital room?: A Little Help needed climbing 3-5 steps with a railing? : A Lot 6 Click Score: 13    End of Session Equipment Utilized During Treatment: Gait belt Activity Tolerance: Patient limited by pain Patient left: in chair;with call bell/phone within reach;with family/visitor present Nurse Communication: Mobility status;Patient requests pain meds PT Visit Diagnosis: Other abnormalities of gait and mobility (R26.89);Pain Pain - Right/Left: Right Pain - part of body: Knee    Time: [id number] PT Time Calculation (min) (ACUTE ONLY): 34 min   Charges:   PT Evaluation $PT Eval Low Complexity: 1 Low PT Treatments $Gait Training: 8-22 mins   PT G Codes:        Gladys DammeBrittany Arlisa Leclere, PT, DPT  Acute Rehabilitation Services  Pager: [phone]   Lehman PromBrittany S Clodagh Odenthal 12/07/2017, 5:36 PM

## 2017-12-07 NOTE — Anesthesia Procedure Notes (Signed)
Procedure Name: MAC Date/Time: 12/07/2017 9:15 AM Performed by: Eligha Bridegroom, CRNA Pre-anesthesia Checklist: Patient identified, Emergency Drugs available, Suction available, Patient being monitored and Timeout performed Patient Re-evaluated:Patient Re-evaluated prior to induction Oxygen Delivery Method: Nasal cannula Preoxygenation: Pre-oxygenation with 100% oxygen Induction Type: IV induction

## 2017-12-07 NOTE — Op Note (Signed)
PATIENT ID:      Brenda Sellers  MRN:     [id number] DOB/AGE:    [birth year] / 39 y.o.       OPERATIVE REPORT    DATE OF PROCEDURE:  12/07/2017       PREOPERATIVE DIAGNOSIS:   RIGHT KNEE OSTEOARTHRITIS      Estimated body mass index is 34.11 kg/m as calculated from the following:   Height as of 11/26/17: 5\' 9"  (1.753 m).   Weight as of 11/26/17: 231 lb (104.8 kg).                                                        POSTOPERATIVE DIAGNOSIS:   RIGHT KNEE OSTEOARTHRITIS                                                                      PROCEDURE:  Procedure(s): RIGHT TOTAL KNEE ARTHROPLASTY Using DepuyAttune RP implants #6NR Femur, #5Tibia, 7 mm Attune RP bearing, 38 Patella     SURGEON: Nestor Lewandowsky    ASSISTANT:   Tomi Likens. [employer]   (Present and scrubbed throughout the case, critical for assistance with exposure, retraction, instrumentation, and closure.)         ANESTHESIA: Spinal, 20cc Exparel, 50cc 0.25% Marcaine  EBL: 300  FLUID REPLACEMENT: 1500 crystalloid  TOURNIQUET TIME:  Drains: None  Tranexamic Acid: 1gm IV, 2gm topical  COMPLICATIONS:  None         INDICATIONS FOR PROCEDURE: The patient has  RIGHT KNEE OSTEOARTHRITIS, Val deformities, XR shows bone on bone arthritis, lateral subluxation of tibia. Patient has failed all conservative measures including anti-inflammatory medicines, narcotics, attempts at  exercise and weight loss, cortisone injections and viscosupplementation.  Risks and benefits of surgery have been discussed, questions answered.   DESCRIPTION OF PROCEDURE: The patient identified by armband, received  IV antibiotics, in the holding area at [facility]. Patient taken to the operating room, appropriate anesthetic  monitors were attached, and Spinal anesthesia was  induced. Tourniquet  applied high to the operative thigh. Lateral post and foot positioner  applied to the table, the lower extremity was then prepped and draped  in usual  sterile fashion from the toes to the tourniquet. Time-out procedure was performed. We began the operation, with the knee flexed 120 degrees, by making the anterior midline incision starting at handbreadth above the patella going over the patella 1 cm medial to and 4 cm distal to the tibial tubercle. Small bleeders in the skin and the  subcutaneous tissue identified and cauterized. Transverse retinaculum was incised and reflected medially and a medial parapatellar arthrotomy was accomplished. the patella was everted and theprepatellar fat pad resected. The superficial medial collateral  ligament was then elevated from anterior to posterior along the proximal  flare of the tibia and anterior half of the menisci resected. The knee was hyperflexed exposing bone on bone arthritis. Peripheral and notch osteophytes as well as the cruciate ligaments were then resected. We continued to  work our way around posteriorly along the proximal tibia,  and externally  rotated the tibia subluxing it out from underneath the femur. A McHale  retractor was placed through the notch and a lateral Hohmann retractor  placed, and we then drilled through the proximal tibia in line with the  axis of the tibia followed by an intramedullary guide rod and 2-degree  posterior slope cutting guide. The tibial cutting guide, 3 degree posterior sloped, was pinned into place allowing resection of 7 mm of bone medially and 2 mm of bone laterally. Satisfied with the tibial resection, we then  entered the distal femur 2 mm anterior to the PCL origin with the  intramedullary guide rod and applied the distal femoral cutting guide  set at 9 mm, with 5 degrees of valgus. This was pinned along the  epicondylar axis. At this point, the distal femoral cut was accomplished without difficulty. We then sized for a #6NR femoral component and pinned the guide in 0 degrees of external rotation. The chamfer cutting guide was pinned into place. The anterior,  posterior, and chamfer cuts were accomplished without difficulty followed by  the Attune RP box cutting guide and the box cut. We also removed posterior osteophytes from the posterior femoral condyles. At this  time, the knee was brought into full extension. We checked our  extension and flexion gaps and found them symmetric for a 7 mm bearing. Distracting in extension with a lamina spreader, the posterior horns of the menisci were removed, and Exparel, diluted to 60 cc, with 20cc NS, and 20cc 0.5% Marcaine,was injected into the capsule and synovium of the knee. The posterior patella cut was accomplished with the 9.5 mm Attune cutting guide, sized for a 38mm dome, and the fixation pegs drilled.The knee  was then once again hyperflexed exposing the proximal tibia. We sized for a # 5 tibial base plate, applied the smokestack and the conical reamer followed by the the Delta fin keel punch. We then hammered into place the Attune RP trial femoral component, drilled the lugs, inserted a  7 mm trial bearing, trial patellar button, and took the knee through range of motion from 0-130 degrees. No thumb pressure was required for patellar Tracking. At this point, the limb was wrapped with an Esmarch bandage and the tourniquet inflated to 350 mmHg. All trial components were removed, mating surfaces irrigated with pulse lavage, and dried with suction and sponges. 10 cc of the Exparel solution was applied to the cancellus bone of the patella distal femur and proximal tibia.  After waiting 1 minute, the bony surfaces were again, dried with sponges. A double batch of DePuy HV cement with 1500 mg of Zinacef was mixed and applied to all bony metallic mating surfaces except for the posterior condyles of the femur itself. In order, we hammered into place the tibial tray and removed excess cement, the femoral component and removed excess cement. The final Attune RP bearing  was inserted, and the knee brought to full extension with  compression.  The patellar button was clamped into place, and excess cement  removed. While the cement cured the wound was irrigated out with normal saline solution pulse lavage. Ligament stability and patellar tracking were checked and found to be excellent. The parapatellar arthrotomy was closed with  running #1 Vicryl suture. The subcutaneous tissue with 0 and 2-0 undyed  Vicryl suture, and the skin with running 3-0 SQ vicryl. A dressing of Xeroform,  4 x 4, dressing sponges, Webril, and Ace wrap applied. The patient  awakened, and taken  to recovery room without difficulty.   Nestor LewandowskyFrank J Detria Cummings 12/07/2017, 10:41 AM

## 2017-12-07 NOTE — Anesthesia Procedure Notes (Signed)
Anesthesia Regional Block: Adductor canal block   Pre-Anesthetic Checklist: ,, timeout performed, Correct Patient, Correct Site, Correct Laterality, Correct Procedure, Correct Position, site marked, Risks and benefits discussed,  Surgical consent,  Pre-op evaluation,  At surgeon's request and post-op pain management  Laterality: Right  Prep: chloraprep       Needles:  Injection technique: Single-shot  Needle Type: Echogenic Stimulator Needle     Needle Length: 9cm  Needle Gauge: 21     Additional Needles:   Narrative:  Start time: 12/07/2017 8:45 AM End time: 12/07/2017 8:55 AM Injection made incrementally with aspirations every 5 mL.  Performed by: Personally  Anesthesiologist: Arta Brucessey, Niah Heinle, MD  Additional Notes: Monitors applied. Patient sedated. Sterile prep and drape,hand hygiene and sterile gloves were used. Relevant anatomy identified.Needle position confirmed.Local anesthetic injected incrementally after negative aspiration. Local anesthetic spread visualized around nerve(s). Vascular puncture avoided. No complications. Image printed for medical record.The patient tolerated the procedure well.    Arta BruceKevin Aruna Nestler MD

## 2017-12-08 ENCOUNTER — Encounter (HOSPITAL_COMMUNITY): Payer: Self-pay | Admitting: Orthopedic Surgery

## 2017-12-08 LAB — CBC
HCT: 35 % — ABNORMAL LOW (ref 36.0–46.0)
Hemoglobin: 11.8 g/dL — ABNORMAL LOW (ref 12.0–15.0)
MCH: 30.6 pg (ref 26.0–34.0)
MCHC: 33.7 g/dL (ref 30.0–36.0)
MCV: 90.7 fL (ref 78.0–100.0)
PLATELETS: 218 10*3/uL (ref 150–400)
RBC: 3.86 MIL/uL — AB (ref 3.87–5.11)
RDW: 12.1 % (ref 11.5–15.5)
WBC: 6.9 10*3/uL (ref 4.0–10.5)

## 2017-12-08 NOTE — Progress Notes (Signed)
Physical Therapy Treatment Patient Details Name: Brenda Sellers MRN: [id number] DOB: [birth year] Today's Date: 12/08/2017    History of Present Illness Pt is a 37 y/o female s/p elecitve R TKA. PMH includes anxiety, ACL repair, and pericardial cyst.     PT Comments    Patient received in chair, pleasant and willing to participate in PT this afternoon. Note R knee AROM 9-77 degrees. Patient was able to gait train approximately 42f today with RW, VC for upright posture and heel toe gait pattern, gait distance limited by R knee pain and fatigue and 2 short standing rest breaks were required. Introduced sBest boywith B railings and step to pattern which patient found very challenging, visible muscle fatigue noted with stair climbing today, patient with difficulty in navigating just 2 steps and reports she will need to do 8 when she goes home. She was left up in the chair with all needs met, RN aware of request for pain medicine this afternoon.    Follow Up Recommendations  DC plan and follow up therapy as arranged by surgeon;Supervision for mobility/OOB     Equipment Recommendations  None recommended by PT    Recommendations for Other Services OT consult     Precautions / Restrictions Precautions Precautions: Knee Precaution Booklet Issued: Yes (comment) Precaution Comments: Reviewed supine ther ex.  Restrictions Weight Bearing Restrictions: Yes RLE Weight Bearing: Weight bearing as tolerated    Mobility  Bed Mobility               General bed mobility comments: DNT, received up in chair   Transfers Overall transfer level: Needs assistance Equipment used: Rolling walker (2 wheeled) Transfers: Sit to/from Stand Sit to Stand: Supervision         General transfer comment: S for safety   Ambulation/Gait Ambulation/Gait assistance: Supervision Ambulation Distance (Feet): 60 Feet Assistive device: Rolling walker (2 wheeled) Gait Pattern/deviations: Step-to  pattern;Decreased step length - right;Decreased step length - left;Decreased weight shift to right;Antalgic Gait velocity: Decreased   General Gait Details: cues for upright posture, heel toe pattern   Stairs Stairs: Yes   Stair Management: Two rails;Step to pattern;Forwards Number of Stairs: 2 General stair comments: Mod VC and encouragement, visible muscle shaking noted during stair navigation even in L LE   Wheelchair Mobility    Modified Rankin (Stroke Patients Only)       Balance Overall balance assessment: Needs assistance Sitting-balance support: No upper extremity supported;Feet supported Sitting balance-[name]: Good     Standing balance support: Bilateral upper extremity supported;During functional activity Standing balance-[name]: Fair Standing balance comment: Reliant on BUE support.                             Cognition Arousal/Alertness: Awake/alert Behavior During Therapy: WFL for tasks assessed/performed Overall Cognitive Status: Within Functional Limits for tasks assessed                                        Exercises Total Joint Exercises Goniometric ROM: R knee AROM 9 degrees extension, 77 degrees flexion     General Comments General comments (skin integrity, edema, etc.): husband present during session       Pertinent Vitals/Pain Pain Assessment: 0-10 Pain Score: 5  Pain Location: R knee  Pain Descriptors / Indicators: Constant;Shooting;Operative site guarding Pain Intervention(s): Limited activity within  patient's tolerance;Monitored during session;Patient requesting pain meds-RN notified    Home Living                      Prior Function            PT Goals (current goals can now be found in the care plan section) Acute Rehab PT Goals Patient Stated Goal: to decrease pain  PT Goal Formulation: With patient Time For Goal Achievement: 12/21/17 Potential to Achieve Goals: Good Progress  towards PT goals: Progressing toward goals    Frequency    7X/week      PT Plan Current plan remains appropriate    Co-evaluation              AM-PAC PT "6 Clicks" Daily Activity  Outcome Measure  Difficulty turning over in bed (including adjusting bedclothes, sheets and blankets)?: Unable Difficulty moving from lying on back to sitting on the side of the bed? : Unable Difficulty sitting down on and standing up from a chair with arms (e.g., wheelchair, bedside commode, etc,.)?: Unable Help needed moving to and from a bed to chair (including a wheelchair)?: A Little Help needed walking in hospital room?: A Little Help needed climbing 3-5 steps with a railing? : A Lot 6 Click Score: 11    End of Session Equipment Utilized During Treatment: Gait belt Activity Tolerance: Patient tolerated treatment well;Patient limited by pain Patient left: in chair;with call bell/phone within reach Nurse Communication: Patient requests pain meds PT Visit Diagnosis: Other abnormalities of gait and mobility (R26.89);Pain Pain - Right/Left: Right Pain - part of body: Knee     Time: 1350-1420 PT Time Calculation (min) (ACUTE ONLY): 30 min  Charges:  $Gait Training: 23-37 mins                    G Codes:  Functional Assessment Tool Used: AM-PAC 6 Clicks Basic Mobility;Clinical judgement    Deniece Ree PT, DPT, CBIS  Supplemental Physical Therapist [place]   Pager [phone]

## 2017-12-08 NOTE — Care Management Note (Signed)
Case Management Note  Patient Details  Name: Sampson SiStacy I Grater MRN: [id number] Date of Birth: 1981-06-22  Subjective/Objective:   37 yr old female s/p right total knee arthroplasty.                 Action/Plan: Patient is under worker's comp, Home Health and DME have been preoperatively arranged by Alexia FreestonePatty   [phone]. She will have family support at discharge.    Expected Discharge Date:    12/09/17              Expected Discharge Plan:  Home w Home Health Services  In-House Referral:  NA  Discharge planning Services  CM Consult  Post Acute Care Choice:  Durable Medical Equipment, Home Health Choice offered to:  NA(Worker's Comp arranged Home Health and DME)  DME Arranged:  3-N-1, Walker rolling DME Agency:     HH Arranged:  PT HH Agency:  [facility]  Status of Service:  Completed, signed off  If discussed at Long Length of Stay Meetings, dates discussed:    Additional Comments:  Durenda GuthrieBrady, Keiji Melland Naomi, RN 12/08/2017, 3:37 PM

## 2017-12-08 NOTE — Progress Notes (Signed)
OT Cancellation Note  Patient Details Name: Brenda Sellers MRN: [id number] DOB: Mar 27, 1981   Cancelled Treatment:    Reason Eval/Treat Not Completed: Pain limiting ability to participate; Pt in tears upon entering room due to high pain levels, RN present and administering pain meds; ice applied to knee. Will follow up as schedule permits and as Pt is able to participate.  Marcy SirenBreanna Shahed Yeoman, OT Pager [phone] 12/08/2017   Orlando PennerBreanna L Lauretta Sallas 12/08/2017, 10:32 AM

## 2017-12-08 NOTE — Plan of Care (Signed)
  Education: Knowledge of General Education information will improve 12/08/2017 0426 - Progressing by Olena Materobinson, Jarel Cuadra G, RN Note POC and pain management reviewed with pt.

## 2017-12-08 NOTE — Progress Notes (Signed)
Physical Therapy Treatment Patient Details Name: Brenda Sellers MRN: [id number] DOB: May 16, 1981 Today's Date: 12/08/2017    History of Present Illness Pt is a 37 y/o female s/p elecitve R TKA. PMH includes anxiety, ACL repair, and pericardial cyst.     PT Comments    Patient received in bed with high pain levels, pleasant and willing to attempt to work with PT this morning. Began session with total knee exercises, then proceeded with functional mobility with patient generally only requiring Min guard for all mobility this morning although very pain limited. Able to perform toileting and self-care in bathroom with S for safety. Gait distance and intensity of activities limited this morning due to high pain levels, RN notified regarding patient request for pain medicine. She was left up in the chair with all needs otherwise met this morning.     Follow Up Recommendations  DC plan and follow up therapy as arranged by surgeon;Supervision for mobility/OOB     Equipment Recommendations  None recommended by PT    Recommendations for Other Services OT consult     Precautions / Restrictions Precautions Precautions: Knee Precaution Booklet Issued: Yes (comment) Precaution Comments: Reviewed supine ther ex.  Restrictions Weight Bearing Restrictions: Yes RLE Weight Bearing: Weight bearing as tolerated    Mobility  Bed Mobility Overal bed mobility: Needs Assistance Bed Mobility: Supine to Sit     Supine to sit: Min guard     General bed mobility comments: VC for safety and sequencing, increased time and effort   Transfers Overall transfer level: Needs assistance Equipment used: Rolling walker (2 wheeled) Transfers: Sit to/from Stand Sit to Stand: Min guard         General transfer comment: VC and min guard for safety   Ambulation/Gait Ambulation/Gait assistance: Min guard Ambulation Distance (Feet): 20 Feet(83f to and from bathroom in room ) Assistive device: Rolling  walker (2 wheeled) Gait Pattern/deviations: Step-to pattern;Decreased step length - right;Decreased step length - left;Decreased weight shift to right;Antalgic     General Gait Details: continues with slow antalgic gait pattern secondary to pain however gait tolerance is improving, occasional cues for safety with RW    Stairs            Wheelchair Mobility    Modified Rankin (Stroke Patients Only)       Balance Overall balance assessment: Needs assistance Sitting-balance support: No upper extremity supported;Feet supported Sitting balance-[name]: Good     Standing balance support: Bilateral upper extremity supported;During functional activity Standing balance-[name]: Fair Standing balance comment: Reliant on BUE support.                             Cognition Arousal/Alertness: Awake/alert Behavior During Therapy: WFL for tasks assessed/performed Overall Cognitive Status: Within Functional Limits for tasks assessed                                        Exercises Total Joint Exercises Ankle Circles/Pumps: Both;10 reps;Supine Quad Sets: Right;10 reps;Supine Heel Slides: Right;10 reps;Supine Hip ABduction/ADduction: Right;10 reps;Supine Straight Leg Raises: Right;10 reps;Supine    General Comments        Pertinent Vitals/Pain Pain Assessment: 0-10 Pain Score: 8  Pain Location: R knee  Pain Descriptors / Indicators: Constant;Shooting;Operative site guarding Pain Intervention(s): Limited activity within patient's tolerance;Monitored during session;Patient requesting pain meds-RN notified;Repositioned;Ice applied  Home Living                      Prior Function            PT Goals (current goals can now be found in the care plan section) Acute Rehab PT Goals Patient Stated Goal: to decrease pain  PT Goal Formulation: With patient Time For Goal Achievement: 12/21/17 Potential to Achieve Goals: Good Progress  towards PT goals: Progressing toward goals    Frequency    7X/week      PT Plan Current plan remains appropriate    Co-evaluation              AM-PAC PT "6 Clicks" Daily Activity  Outcome Measure  Difficulty turning over in bed (including adjusting bedclothes, sheets and blankets)?: Unable Difficulty moving from lying on back to sitting on the side of the bed? : Unable Difficulty sitting down on and standing up from a chair with arms (e.g., wheelchair, bedside commode, etc,.)?: Unable Help needed moving to and from a bed to chair (including a wheelchair)?: A Little Help needed walking in hospital room?: A Little Help needed climbing 3-5 steps with a railing? : A Lot 6 Click Score: 11    End of Session Equipment Utilized During Treatment: Gait belt Activity Tolerance: Patient limited by pain Patient left: in chair;with call bell/phone within reach Nurse Communication: Patient requests pain meds PT Visit Diagnosis: Other abnormalities of gait and mobility (R26.89);Pain Pain - Right/Left: Right Pain - part of body: Knee     Time: 1287-8676 PT Time Calculation (min) (ACUTE ONLY): 24 min  Charges:  $Gait Training: 8-22 mins $Therapeutic Exercise: 8-22 mins                    G Codes:  Functional Assessment Tool Used: AM-PAC 6 Clicks Basic Mobility;Clinical judgement    Deniece Ree PT, DPT, CBIS  Supplemental Physical Therapist [place]   Pager [phone]

## 2017-12-08 NOTE — Evaluation (Addendum)
Occupational Therapy Evaluation Patient Details Name: Brenda Sellers MRN: [id number] DOB: June 15, 1981 Today's Date: 12/08/2017    History of Present Illness Pt is a 37 y/o female s/p elecitve R TKA. PMH includes anxiety, ACL repair, and pericardial cyst.    Clinical Impression   This 37 y/o F presents with the above. At baseline Pt is independent with ADLs and functional mobility. Pt presenting with post-op weakness and pain in RLE. Pt completing functional mobility within room, toileting and standing grooming ADLs at RW level with MinGuard assist throughout. Currently requires MinGuard-MinA for LB ADLs. Pt reports spouse will be available 24hr to assist with ADLs PRN after return home. Pt will benefit from continued acute OT therapy services to maximize her safety and independence with ADLs and mobility. Feel Pt will progress well with further decrease/management in pain levels.     Follow Up Recommendations  DC plan and follow up therapy as arranged by surgeon;Supervision/Assistance - 24 hour    Equipment Recommendations  Tub/shower seat           Precautions / Restrictions Precautions Precautions: Knee Precaution Booklet Issued: Yes (comment) Precaution Comments: reviewed with Pt  Restrictions Weight Bearing Restrictions: Yes RLE Weight Bearing: Weight bearing as tolerated      Mobility Bed Mobility               General bed mobility comments: received up in chair   Transfers Overall transfer level: Needs assistance Equipment used: Rolling walker (2 wheeled) Transfers: Sit to/from Stand Sit to Stand: Supervision         General transfer comment: S for safety     Balance Overall balance assessment: Needs assistance Sitting-balance support: No upper extremity supported;Feet supported Sitting balance-[name]: Good     Standing balance support: Bilateral upper extremity supported;During functional activity Standing balance-[name]: Fair Standing  balance comment: Reliant on BUE support during mobility; able to maintain static standing while managing clothing during toileting and standing to wash hands at sink with close minguard for safety                             ADL either performed or assessed with clinical judgement   ADL Overall ADL's : Needs assistance/impaired Eating/Feeding: Modified independent;Sitting   Grooming: Wash/dry hands;Min guard;Standing   Upper Body Bathing: Set up;Sitting   Lower Body Bathing: Min guard;Sit to/from stand   Upper Body Dressing : Sitting;Supervision/safety   Lower Body Dressing: Minimal assistance;Sit to/from stand Lower Body Dressing Details (indicate cue type and reason): reviewed compensatory techniques for task completion  Toilet Transfer: Min guard;Ambulation;Comfort height toilet;Grab bars;RW   Toileting- ArchitectClothing Manipulation and Hygiene: Min guard;Sit to/from stand Toileting - Clothing Manipulation Details (indicate cue type and reason): Pt completing clothing management and peri-care after voiding bladder with close minguard for safety    Tub/Shower Transfer Details (indicate cue type and reason): reviewed transfer technique/sequence, will plan to practice in following tx sessions as Pt with increased pain levels and requesting to hold practicing during this session; discussed use of 3:1 vs shower seat during task completion, Pt reports 3:1 will not fit in shower but believes shower chair will   Functional mobility during ADLs: Min guard;Rolling walker                           Pertinent Vitals/Pain Pain Assessment: Faces Pain Score: 5  Faces Pain Scale: Hurts even more  Pain Location: R knee  Pain Descriptors / Indicators: Constant;Shooting;Operative site guarding Pain Intervention(s): Limited activity within patient's tolerance;Monitored during session;Premedicated before session;Ice applied     Hand Dominance Right   Extremity/Trunk Assessment Upper  Extremity Assessment Upper Extremity Assessment: Overall WFL for tasks assessed   Lower Extremity Assessment Lower Extremity Assessment: Defer to PT evaluation   Cervical / Trunk Assessment Cervical / Trunk Assessment: Normal   Communication Communication Communication: No difficulties   Cognition Arousal/Alertness: Awake/alert Behavior During Therapy: WFL for tasks assessed/performed Overall Cognitive Status: Within Functional Limits for tasks assessed                                                    Home Living Family/patient expects to be discharged to:: Private residence Living Arrangements: Spouse/significant other;Children Available Help at Discharge: Family;Available 24 hours/day Type of Home: House Home Access: Stairs to enter [employer] of Steps: 8 Entrance Stairs-Rails: Right;Left;Can reach both Home Layout: One level     Bathroom Shower/Tub: Producer, television/film/video: Standard     Home Equipment: Environmental consultant - 2 wheels;Bedside commode;Cane - single point          Prior Functioning/Environment Level of Independence: Independent                 OT Problem List: Impaired balance (sitting and/or standing);Pain;Decreased knowledge of use of DME or AE;Decreased activity tolerance;Decreased range of motion      OT Treatment/Interventions: Self-care/ADL training;DME and/or AE instruction;Therapeutic activities;Balance training;Therapeutic exercise;Energy conservation;Patient/family education    OT Goals(Current goals can be found in the care plan section) Acute Rehab OT Goals Patient Stated Goal: to decrease pain  OT Goal Formulation: With patient Time For Goal Achievement: 12/22/17 Potential to Achieve Goals: Good  OT Frequency: Min 2X/week                             AM-PAC PT "6 Clicks" Daily Activity     Outcome Measure Help from another person eating meals?: None Help from another person taking  care of personal grooming?: None Help from another person toileting, which includes using toliet, bedpan, or urinal?: A Little Help from another person bathing (including washing, rinsing, drying)?: A Little Help from another person to put on and taking off regular upper body clothing?: None Help from another person to put on and taking off regular lower body clothing?: A Little 6 Click Score: 21   End of Session Equipment Utilized During Treatment: Gait belt;Rolling walker Nurse Communication: Mobility status  Activity Tolerance: Patient tolerated treatment well;Patient limited by pain Patient left: in chair;with call bell/phone within reach  OT Visit Diagnosis: Pain;Other abnormalities of gait and mobility (R26.89) Pain - Right/Left: Right Pain - part of body: Knee                Time: 1610-9604 OT Time Calculation (min): 28 min Charges:  OT General Charges $OT Visit: 1 Visit OT Evaluation $OT Eval Low Complexity: 1 Low OT Treatments $Self Care/Home Management : 8-22 mins G-Codes:     Marcy Siren, OT Pager [phone] 12/08/2017   Orlando Penner 12/08/2017, 2:57 PM

## 2017-12-08 NOTE — Progress Notes (Signed)
PATIENT ID: Brenda Sellers  MRN: [id number]  DOB/AGE:  Oct 12, 1981 / 37 y.o.  1 Day Post-Op Procedure(s) (LRB): RIGHT TOTAL KNEE ARTHROPLASTY (Right)    PROGRESS NOTE Subjective: Patient is alert, oriented, no Nausea, no Vomiting, yes passing gas. Taking PO well. Denies SOB, Chest or Calf Pain. Using Incentive Spirometer, PAS in place. Ambulate walked in room, Patient reports pain as 4/10 .    Objective: Vital signs in last 24 hours: Vitals:   12/07/17 1240 12/07/17 1940 12/08/17 0005 12/08/17 0435  BP: 109/65 (!) 108/58 (!) 112/51 114/64  Pulse: 98 95 98 (!) 101  Resp:      Temp: 97.7 F (36.5 C) 98.1 F (36.7 C) 98.2 F (36.8 C) 99.2 F (37.3 C)  TempSrc: Oral Oral Oral Oral  SpO2: 100% 100% 100% 100%      Intake/Output from previous day: I/O last 3 completed shifts: In: 1540 [P.O.:240; I.V.:1300] Out: 251 [Urine:201; Blood:50]   Intake/Output this shift: No intake/output data recorded.   LABORATORY DATA: Recent Labs    12/07/17 0733  WBC 7.3  HGB 12.9  HCT 38.1  PLT 246  INR 0.99    Examination: Neurologically intact ABD soft Neurovascular intact Sensation intact distally Intact pulses distally Dorsiflexion/Plantar flexion intact Incision: dressing C/D/I No cellulitis present Compartment soft}  Assessment:   1 Day Post-Op Procedure(s) (LRB): RIGHT TOTAL KNEE ARTHROPLASTY (Right) ADDITIONAL DIAGNOSIS: Expected Acute Blood Loss Anemia, obesity  Plan: PT/OT WBAT, AROM and PROM  DVT Prophylaxis:  SCDx72hrs, ASA 325 mg BID x 2 weeks DISCHARGE PLAN: Home, Today.  If she passes all physical therapy goals DISCHARGE NEEDS: HHPT, Walker and 3-in-1 comode seat     Nestor LewandowskyFrank J Corwin Kuiken 12/08/2017, 7:44 AM

## 2017-12-09 ENCOUNTER — Other Ambulatory Visit: Payer: Self-pay

## 2017-12-09 ENCOUNTER — Encounter (HOSPITAL_COMMUNITY): Payer: Self-pay | Admitting: General Practice

## 2017-12-09 LAB — CBC
HCT: 32.8 % — ABNORMAL LOW (ref 36.0–46.0)
Hemoglobin: 11 g/dL — ABNORMAL LOW (ref 12.0–15.0)
MCH: 29.9 pg (ref 26.0–34.0)
MCHC: 33.5 g/dL (ref 30.0–36.0)
MCV: 89.1 fL (ref 78.0–100.0)
Platelets: 247 10*3/uL (ref 150–400)
RBC: 3.68 MIL/uL — AB (ref 3.87–5.11)
RDW: 12 % (ref 11.5–15.5)
WBC: 14 10*3/uL — AB (ref 4.0–10.5)

## 2017-12-09 MED ORDER — CELECOXIB 200 MG PO CAPS: 200.0000 mg | ORAL_CAPSULE | Freq: Two times a day (BID) | ORAL | 1 refills | Status: DC

## 2017-12-09 MED FILL — OXYCODONE-ACETAMINOPHEN 5-3: 5-325 | 5 days supply | Qty: 30 | Fill #0 | Status: TO

## 2017-12-09 MED FILL — CELECOXIB 200 MG CAP: 200 | 30 days supply | Qty: 60 | Fill #0 | Status: TO

## 2017-12-09 NOTE — Discharge Summary (Signed)
Patient ID: Brenda Sellers MRN: [id number] DOB/AGE: [birth year] 37 y.o.  Admit date: 12/07/2017 Discharge date: 12/09/2017  Admission Diagnoses:  Principal Problem:   Osteoarthritis of right knee Active Problems:   Primary osteoarthritis of right hip   Discharge Diagnoses:  Same  Past Medical History:  Diagnosis Date  . Anxiety   . Arthritis   . Complication of anesthesia   . GERD (gastroesophageal reflux disease)    OCC  . Headache   . Nerve pain   . Pericardial cyst   . PONV (postoperative nausea and vomiting)    Last surgery 06/2014- nausea not vomitting was pre medicated.  Marland Kitchen PSVT (paroxysmal supraventricular tachycardia) (HCC)     Surgeries: Procedure(s): RIGHT TOTAL KNEE ARTHROPLASTY on 12/07/2017   Consultants:   Discharged Condition: Improved  Hospital Course: Brenda Sellers is an 37 y.o. female who was admitted 12/07/2017 for operative treatment ofOsteoarthritis of right knee. Patient has severe unremitting pain that affects sleep, daily activities, and work/hobbies. After pre-op clearance the patient was taken to the operating room on 12/07/2017 and underwent  Procedure(s): RIGHT TOTAL KNEE ARTHROPLASTY.    Patient was given perioperative antibiotics:  Anti-infectives (From admission, onward)   Start     Dose/Rate Route Frequency Ordered Stop   12/07/17 0700  vancomycin (VANCOCIN) 1,500 mg in sodium chloride 0.9 % 500 mL IVPB     1,500 mg 250 mL/hr over 120 Minutes Intravenous On call to O.R. 12/04/17 1334 12/07/17 1000       Patient was given sequential compression devices, early ambulation, and chemoprophylaxis to prevent DVT.  Patient benefited maximally from hospital stay and there were no complications.    Recent vital signs:  Patient Vitals for the past 24 hrs:  BP Temp Temp src Pulse Resp SpO2  12/09/17 0432 (!) 122/57 98 F (36.7 C) Oral 100 18 99 %  12/08/17 1945 111/66 98.2 F (36.8 C) Oral (!) 115 18 97 %  12/08/17 1714 137/83 99.7 F  (37.6 C) Oral (!) 121 18 100 %     Recent laboratory studies:  Recent Labs    12/07/17 0733 12/08/17 0704 12/09/17 0459  WBC 7.3 6.9 14.0*  HGB 12.9 11.8* 11.0*  HCT 38.1 35.0* 32.8*  PLT 246 218 247  INR 0.99  --   --      Discharge Medications:   Allergies as of 12/09/2017      Reactions   Augmentin [amoxicillin-pot Clavulanate] Rash, Other (See Comments)   Can take any other PCNs   Vicodin [hydrocodone-acetaminophen] Nausea And Vomiting, Rash, Nausea Only   Codeine Sulfate Nausea And Vomiting   Hydrocodone-acetaminophen Nausea Only   Hydrocodone-homatropine Other (See Comments)   Penicillin G Other (See Comments)      Medication List    STOP taking these medications   ibuprofen 200 MG tablet Commonly known as:  ADVIL,MOTRIN     TAKE these medications   albuterol 108 (90 Base) MCG/ACT inhaler Commonly known as:  PROVENTIL HFA;VENTOLIN HFA Inhale 2 puffs into the lungs every 6 (six) hours as needed for wheezing or shortness of breath.   ALPRAZolam 0.5 MG tablet Commonly known as:  XANAX Take 0.5 mg by mouth 2 (two) times daily as needed for anxiety.   aspirin EC 325 MG tablet Take 1 tablet (325 mg total) by mouth 2 (two) times daily.   buPROPion 150 MG 24 hr tablet Commonly known as:  WELLBUTRIN XL Take 150 mg by mouth daily.   celecoxib 200 MG capsule  Commonly known as:  CELEBREX Take 1 capsule (200 mg total) by mouth 2 (two) times daily.   chlorpheniramine-HYDROcodone 10-8 MG/5ML Suer Commonly known as:  TUSSIONEX PENNKINETIC ER Take 5 mLs by mouth at bedtime as needed for cough.   diazepam 5 MG tablet Commonly known as:  VALIUM Take 5 mg by mouth at bedtime as needed for muscle spasms.   DULoxetine 60 MG capsule Commonly known as:  CYMBALTA Take 60 mg by mouth daily.   gabapentin 300 MG capsule Commonly known as:  NEURONTIN Take 300 mg by mouth 2 (two) times daily.   methocarbamol 750 MG tablet Commonly known as:  ROBAXIN Take 1 tablet  (750 mg total) by mouth every 6 (six) hours as needed for muscle spasms. What changed:  when to take this   MULTIVITAMIN PO Take 1 packet by mouth daily. Essential Oil Supplement Packet - 8 tablets total   oxyCODONE-acetaminophen 5-325 MG tablet Commonly known as:  ROXICET Take 1 tablet by mouth every 4 (four) hours as needed. What changed:  when to take this   predniSONE 10 MG tablet Commonly known as:  DELTASONE Take 1 tablet (10 mg total) by mouth daily. Day 1-2: Take 50 mg  ( 5 pills) Day 3-4 : Take 40 mg (4pills) Day 5-6: Take 30 mg (3 pills) Day 7-8:  Take 20 mg (2 pills) Day 9:  Take 10mg  (1 pill)   prochlorperazine 10 MG tablet Commonly known as:  COMPAZINE Take 1 tablet (10 mg total) by mouth every 6 (six) hours as needed for nausea.   promethazine 12.5 MG tablet Commonly known as:  PHENERGAN Take 1 tablet (12.5 mg total) by mouth every 6 (six) hours as needed for nausea or vomiting.   traMADol 50 MG tablet Commonly known as:  ULTRAM Take 50 mg by mouth See admin instructions. Take 50 mg by mouth up to 5 times daily as needed for pain            Durable Medical Equipment  (From admission, onward)        Start     Ordered   12/07/17 1256  DME Walker rolling  Once    Question:  Patient needs a walker to treat with the following condition  Answer:  Status post right hip replacement   12/07/17 1257   12/07/17 1256  DME 3 n 1  Once     12/07/17 1257   12/07/17 1256  DME Bedside commode  Once    Question:  Patient needs a bedside commode to treat with the following condition  Answer:  Status post right hip replacement   12/07/17 1257      Diagnostic Studies: No results found.  Disposition: 01-Home or Self Care  Discharge Instructions    Call MD / Call 911   Complete by:  As directed    If you experience chest pain or shortness of breath, CALL 911 and be transported to the hospital emergency room.  If you develope a fever above 101 F, pus (white drainage)  or increased drainage or redness at the wound, or calf pain, call your surgeon's office.   Constipation Prevention   Complete by:  As directed    Drink plenty of fluids.  Prune juice may be helpful.  You may use a stool softener, such as Colace (over the counter) 100 mg twice a day.  Use MiraLax (over the counter) for constipation as needed.   Diet - low sodium heart healthy  Complete by:  As directed    Driving restrictions   Complete by:  As directed    No driving for 2 weeks   Increase activity slowly as tolerated   Complete by:  As directed    Patient may shower   Complete by:  As directed    You may shower without a dressing once there is no drainage.  Do not wash over the wound.  If drainage remains, cover wound with plastic wrap and then shower.      Follow-up Information    Gean Birchwoodowan, Frank, MD Follow up in 2 week(s).   Specialty:  Orthopedic Surgery Contact information: [address] [place] [id number] [phone]        [facility] Follow up.   Specialty:  Home Health Services Why:  A representative from [facility] will contact you to arrange start date and time for your therapy.  Contact information: [address] [place] [id number] [phone]            Signed: Dannielle Burnric Ramie Erman 12/09/2017, 7:37 AM

## 2017-12-09 NOTE — Progress Notes (Signed)
Physical Therapy Treatment Patient Details Name: Brenda Sellers MRN: [id number] DOB: 11/27/1980 Today's Date: 12/09/2017    History of Present Illness Pt is a 37 y/o female s/p elecitve R TKA. PMH includes anxiety, ACL repair, and pericardial cyst.     PT Comments    Patient received in bed, pleasant and willing to participate in PT this morning. She is able to complete all functional bed and transfer mobility with S in general, also able to gait train approximately 24f with RW- note considerable improvement in functional gait mechanics however patient continues to be limited by pain. Continued with stair training, she is now able to perform stairs with B UE support with min guard, however becomes quite unsteady with U UE support requiring Min assist from PT to maintain safety/prevent fall. Plan to follow up with more stair training this PM. Patient left up in chair with all needs met, visitor present this morning.     Follow Up Recommendations  DC plan and follow up therapy as arranged by surgeon;Supervision for mobility/OOB     Equipment Recommendations  None recommended by PT    Recommendations for Other Services OT consult     Precautions / Restrictions Precautions Precautions: Knee Precaution Comments: reviewed with Pt  Restrictions Weight Bearing Restrictions: Yes RLE Weight Bearing: Weight bearing as tolerated    Mobility  Bed Mobility Overal bed mobility: Needs Assistance Bed Mobility: Supine to Sit     Supine to sit: Supervision Sit to supine: Supervision;HOB elevated   General bed mobility comments: S for safety with hook method   Transfers Overall transfer level: Needs assistance Equipment used: Rolling walker (2 wheeled) Transfers: Sit to/from Stand Sit to Stand: Supervision         General transfer comment: S for safety, good safety awareness and steadiness noted   Ambulation/Gait Ambulation/Gait assistance: Supervision Ambulation Distance (Feet):  60 Feet Assistive device: Rolling walker (2 wheeled) Gait Pattern/deviations: Step-to pattern;Decreased step length - right;Decreased step length - left;Decreased weight shift to right;Antalgic     General Gait Details: cues for terminal knee extension with swing/knee flexion surgical LE, gait mechanics grossly improving but remain limtied by pain    Stairs Stairs: Yes   Stair Management: Two rails;One rail Left;Forwards Number of Stairs: 6 General stair comments: min guard with B railings today, trialed stairs with U railing noting considerable unsteadiness with descent requring MinA to maintain safety   Wheelchair Mobility    Modified Rankin (Stroke Patients Only)       Balance Overall balance assessment: Needs assistance Sitting-balance support: No upper extremity supported;Feet supported Sitting balance-[name]: Good     Standing balance support: Bilateral upper extremity supported;During functional activity Standing balance-[name]: Fair Standing balance comment: continues to be reliant on B UE support                             Cognition Arousal/Alertness: Awake/alert Behavior During Therapy: WFL for tasks assessed/performed Overall Cognitive Status: Within Functional Limits for tasks assessed                                        Exercises Total Joint Exercises Goniometric ROM: R knee AROM 11 degrees extension, 76 degrees flexion     General Comments General comments (skin integrity, edema, etc.): friend present during session  Pertinent Vitals/Pain Pain Assessment: 0-10 Pain Score: 7  Pain Location: R knee  Pain Descriptors / Indicators: Constant;Shooting;Operative site guarding Pain Intervention(s): Limited activity within patient's tolerance;Monitored during session;Patient requesting pain meds-RN notified    Home Living                      Prior Function            PT Goals (current goals can  now be found in the care plan section) Acute Rehab PT Goals Patient Stated Goal: to decrease pain  PT Goal Formulation: With patient Time For Goal Achievement: 12/21/17 Potential to Achieve Goals: Good Progress towards PT goals: Progressing toward goals    Frequency    7X/week      PT Plan Current plan remains appropriate    Co-evaluation              AM-PAC PT "6 Clicks" Daily Activity  Outcome Measure  Difficulty turning over in bed (including adjusting bedclothes, sheets and blankets)?: None Difficulty moving from lying on back to sitting on the side of the bed? : None Difficulty sitting down on and standing up from a chair with arms (e.g., wheelchair, bedside commode, etc,.)?: None Help needed moving to and from a bed to chair (including a wheelchair)?: A Little Help needed walking in hospital room?: A Little Help needed climbing 3-5 steps with a railing? : A Little 6 Click Score: 21    End of Session Equipment Utilized During Treatment: Gait belt Activity Tolerance: Patient tolerated treatment well Patient left: in chair;with call bell/phone within reach;with family/visitor present Nurse Communication: Patient requests pain meds PT Visit Diagnosis: Other abnormalities of gait and mobility (R26.89);Pain Pain - Right/Left: Right Pain - part of body: Knee     Time: 7867-5449 PT Time Calculation (min) (ACUTE ONLY): 30 min  Charges:  $Gait Training: 23-37 mins                    G Codes:       Deniece Ree PT, DPT, CBIS  Supplemental Physical Therapist [place]   Pager [phone]

## 2017-12-09 NOTE — Progress Notes (Signed)
Physical Therapy Treatment Patient Details Name: Brenda Sellers MRN: [id number] DOB: 1981/03/16 Today's Date: 12/09/2017    History of Present Illness Pt is a 38 y/o female s/p elecitve R TKA. PMH includes anxiety, ACL repair, and pericardial cyst.     PT Comments    Patient received in bed, doing well and willing to participate in PT this afternoon. Continued with stair training with focus on education/practice for husband/patient in terms of navigation of stairs at home; navigated 9 stairs with step to pattern and U railing/HHA from husband on contralateral side, close S from PT throughout. Patient able to safely navigate stairs with U railing and assistance from husband today. Also continued with gait training, patient able to ambulate approximately 141f today with RW, gait mechanics continue to improve and patient only requires occasional cues from PT today. She was left in bed with all needs met, questions and concerns addressed; she and her husband report no further questions or concerns about return home or with TKR exercise program.     Follow Up Recommendations  DC plan and follow up therapy as arranged by surgeon;Supervision for mobility/OOB     Equipment Recommendations  None recommended by PT    Recommendations for Other Services OT consult     Precautions / Restrictions Precautions Precautions: Knee Restrictions Weight Bearing Restrictions: Yes RLE Weight Bearing: Weight bearing as tolerated    Mobility  Bed Mobility Overal bed mobility: Needs Assistance Bed Mobility: Supine to Sit;Sit to Supine     Supine to sit: Supervision Sit to supine: Supervision   General bed mobility comments: S for safety with hook method   Transfers Overall transfer level: Needs assistance Equipment used: Rolling walker (2 wheeled) Transfers: Sit to/from Stand Sit to Stand: Supervision Stand pivot transfers: Supervision       General transfer comment: good safety awareness  noted  Ambulation/Gait Ambulation/Gait assistance: Supervision Ambulation Distance (Feet): 150 Feet Assistive device: Rolling walker (2 wheeled) Gait Pattern/deviations: Step-to pattern;Decreased step length - right;Decreased step length - left;Decreased weight shift to right;Antalgic     General Gait Details: gait mechanics much improved, only occasional cues provided to correct mechanics    Stairs Stairs: Yes   Stair Management: One rail Left;Forwards(U railing, U HHA support on contralateral side ) Number of Stairs: 9 General stair comments: used L rail/R HHA for ascent, R rail/L HHA for descent; provided education to patient and her husband for stair navigation with family acting as min guard at home, step to pattern. Close S from PT throughout activity.   Wheelchair Mobility    Modified Rankin (Stroke Patients Only)       Balance Overall balance assessment: Needs assistance Sitting-balance support: No upper extremity supported;Feet supported Sitting balance-[name]: Good     Standing balance support: Bilateral upper extremity supported;During functional activity Standing balance-[name]: Good Standing balance comment: able to maintain to perform light dynamic tasks in standing without UE support                             Cognition Arousal/Alertness: Awake/alert Behavior During Therapy: WFL for tasks assessed/performed Overall Cognitive Status: Within Functional Limits for tasks assessed                                        Exercises      General Comments General comments (  skin integrity, edema, etc.): friend and husband present during session       Pertinent Vitals/Pain Pain Assessment: 0-10 Pain Score: 6  Pain Location: R knee  Pain Descriptors / Indicators: Constant;Shooting;Operative site guarding Pain Intervention(s): Limited activity within patient's tolerance;Monitored during session;Patient requesting pain  meds-RN notified    Home Living Family/patient expects to be discharged to:: Private residence Living Arrangements: Spouse/significant other                  Prior Function            PT Goals (current goals can now be found in the care plan section) Acute Rehab PT Goals Patient Stated Goal: to decrease pain  PT Goal Formulation: With patient Time For Goal Achievement: 12/21/17 Potential to Achieve Goals: Good Progress towards PT goals: Progressing toward goals    Frequency    7X/week      PT Plan Current plan remains appropriate    Co-evaluation              AM-PAC PT "6 Clicks" Daily Activity  Outcome Measure  Difficulty turning over in bed (including adjusting bedclothes, sheets and blankets)?: None Difficulty moving from lying on back to sitting on the side of the bed? : None Difficulty sitting down on and standing up from a chair with arms (e.g., wheelchair, bedside commode, etc,.)?: None Help needed moving to and from a bed to chair (including a wheelchair)?: A Little Help needed walking in hospital room?: A Little Help needed climbing 3-5 steps with a railing? : A Little 6 Click Score: 21    End of Session Equipment Utilized During Treatment: Gait belt Activity Tolerance: Patient tolerated treatment well Patient left: with call bell/phone within reach;with family/visitor present;in bed Nurse Communication: Patient requests pain meds PT Visit Diagnosis: Other abnormalities of gait and mobility (R26.89);Pain Pain - Right/Left: Right Pain - part of body: Knee     Time: 1515-1550 PT Time Calculation (min) (ACUTE ONLY): 35 min  Charges:  $Gait Training: 8-22 mins $Self Care/Home Management: 8-22                    G Codes:       Deniece Ree PT, DPT, CBIS  Supplemental Physical Therapist [place]   Pager [phone]

## 2017-12-09 NOTE — Progress Notes (Signed)
PATIENT ID: Sampson SiStacy I Bran  MRN: [id number]  DOB/AGE:  [birth year] / 39 y.o.  2 Days Post-Op Procedure(s) (LRB): RIGHT TOTAL KNEE ARTHROPLASTY (Right)    PROGRESS NOTE Subjective: Patient is alert, oriented, no Nausea, no Vomiting, yes passing gas. Taking PO well. Denies SOB, Chest or Calf Pain. Using Incentive Spirometer, PAS in place. Ambulate WBAT with pt walking 60 ft with therapy, Patient reports pain as 6/10 .    Objective: Vital signs in last 24 hours: Vitals:   12/08/17 0435 12/08/17 1714 12/08/17 1945 12/09/17 0432  BP: 114/64 137/83 111/66 (!) 122/57  Pulse: (!) 101 (!) 121 (!) 115 100  Resp:  18 18 18   Temp: 99.2 F (37.3 C) 99.7 F (37.6 C) 98.2 F (36.8 C) 98 F (36.7 C)  TempSrc: Oral Oral Oral Oral  SpO2: 100% 100% 97% 99%      Intake/Output from previous day: I/O last 3 completed shifts: In: 240 [P.O.:240] Out: 1 [Urine:1]   Intake/Output this shift: No intake/output data recorded.   LABORATORY DATA: Recent Labs    12/07/17 0733 12/08/17 0704 12/09/17 0459  WBC 7.3 6.9 14.0*  HGB 12.9 11.8* 11.0*  HCT 38.1 35.0* 32.8*  PLT 246 218 247  INR 0.99  --   --     Examination: Neurologically intact Neurovascular intact Sensation intact distally Intact pulses distally Dorsiflexion/Plantar flexion intact Incision: dressing C/D/I and no drainage No cellulitis present Compartment soft}  Assessment:   2 Days Post-Op Procedure(s) (LRB): RIGHT TOTAL KNEE ARTHROPLASTY (Right) ADDITIONAL DIAGNOSIS: Expected Acute Blood Loss Anemia, obesity  Plan: PT/OT WBAT, AROM and PROM  DVT Prophylaxis:  SCDx72hrs, ASA 325 mg BID x 2 weeks DISCHARGE PLAN: Home DISCHARGE NEEDS: HHPT, Walker and 3-in-1 comode seat     Dannielle Burnric Cali Cuartas 12/09/2017, 7:34 AM

## 2017-12-09 NOTE — Progress Notes (Signed)
Occupational Therapy Treatment Patient Details Name: Brenda Sellers Today's Date: 12/09/2017    History of present illness Brenda Sellers is a 37 y/o female s/p elecitve R TKA. PMH includes anxiety, ACL repair, and pericardial cyst.    OT comments  Brenda Sellers presented sitting up in bed, pleasant and willing to work with therapy, reporting pain levels better managed this AM. Brenda Sellers completing seated and standing ADLs, functional transfers including simulated shower transfer with supervision-MinGuard throughout at RW level. Brenda Sellers return demonstrates good understanding of education provided and all questions answered. Feel Brenda Sellers is safe to return home once medically ready give Brenda Sellers's available family assist after discharge. No further acute OT needs identified at this time. Will sign off.   Follow Up Recommendations  DC plan and follow up therapy as arranged by surgeon;Supervision/Assistance - 24 hour    Equipment Recommendations  Tub/shower seat          Precautions / Restrictions Precautions Precautions: Knee Precaution Comments: reviewed with Brenda Sellers  Restrictions Weight Bearing Restrictions: Yes RLE Weight Bearing: Weight bearing as tolerated       Mobility Bed Mobility Overal bed mobility: Needs Assistance Bed Mobility: Supine to Sit;Sit to Supine     Supine to sit: Supervision;HOB elevated Sit to supine: Supervision;HOB elevated   General bed mobility comments: supervision for safety; Brenda Sellers using hook method to advance RLE off/onto bed   Transfers Overall transfer level: Needs assistance Equipment used: Rolling walker (2 wheeled) Transfers: Sit to/from Stand Sit to Stand: Supervision         General transfer comment: S for safety; stood x2, from EOB and BSC     Balance Overall balance assessment: Needs assistance Sitting-balance support: No upper extremity supported;Feet supported Sitting balance-[name]: Good     Standing balance support: Bilateral upper  extremity supported;During functional activity Standing balance-[name]: Fair Standing balance comment: Reliant on BUE support during mobility; able to maintain static standing while managing clothing during toileting and standing to wash hands at sink with S for safety                             ADL either performed or assessed with clinical judgement   ADL Overall ADL's : Needs assistance/impaired     Grooming: Wash/dry hands;Standing;Supervision/safety               Lower Body Dressing: Min guard;Sit to/from stand Lower Body Dressing Details (indicate cue type and reason): Brenda Sellers donning socks at bed level with setup assist, able to reach RLE without difficulty  Toilet Transfer: Ambulation;Comfort height toilet;Grab bars;RW;Supervision/safety   Toileting- Clothing Manipulation and Hygiene: Supervision/safety;Sit to/from stand Toileting - Clothing Manipulation Details (indicate cue type and reason): Brenda Sellers completing clothing management and peri-care after voiding bladder with S for safety  Tub/ Shower Transfer: Walk-in shower;Min guard;Ambulation;Shower Technical sales engineer Details (indicate cue type and reason): Brenda Sellers with good recall of education from yesterday re: transfer sequence; Brenda Sellers return demonstrating with MinGuard for safety  Functional mobility during ADLs: Supervision/safety;Rolling walker General ADL Comments: Brenda Sellers requirining less assist for functional mobility and ADLs this session, pain appears to be better managed today                        Cognition Arousal/Alertness: Awake/alert Behavior During Therapy: WFL for tasks assessed/performed Overall Cognitive Status: Within Functional Limits for tasks assessed  Pertinent Vitals/ Pain       Pain Assessment: 0-10 Pain Score: 7  Pain Location: R knee  Pain Descriptors / Indicators:  Constant;Shooting;Operative site guarding Pain Intervention(s): Limited activity within patient's tolerance;Monitored during session;Premedicated before session;Ice applied                                                          Frequency  Min 2X/week        Progress Toward Goals  OT Goals(current goals can now be found in the care plan section)  Progress towards OT goals: Goals met/education completed, patient discharged from OT  Acute Rehab OT Goals Patient Stated Goal: to decrease pain  OT Goal Formulation: With patient Time For Goal Achievement: 12/22/17 Potential to Achieve Goals: Good  Plan Discharge plan remains appropriate                     AM-PAC Brenda Sellers "6 Clicks" Daily Activity     Outcome Measure   Help from another person eating meals?: None Help from another person taking care of personal grooming?: None Help from another person toileting, which includes using toliet, bedpan, or urinal?: A Little Help from another person bathing (including washing, rinsing, drying)?: A Little Help from another person to put on and taking off regular upper body clothing?: None Help from another person to put on and taking off regular lower body clothing?: A Little 6 Click Score: 21    End of Session Equipment Utilized During Treatment: Gait belt;Rolling walker  OT Visit Diagnosis: Pain;Other abnormalities of gait and mobility (R26.89) Pain - Right/Left: Right Pain - part of body: Knee   Activity Tolerance Patient tolerated treatment well   Patient Left in bed;with call bell/phone within reach   Nurse Communication Mobility status        Time: 7341-9379 OT Time Calculation (min): 18 min  Charges: OT General Charges $OT Visit: 1 Visit OT Treatments $Self Care/Home Management : 8-22 mins  Brenda Sellers, OT Pager [phone] 12/09/2017    Brenda Sellers 12/09/2017, 9:02 AM

## 2017-12-09 NOTE — Progress Notes (Signed)
Pt complaining of chills and fever took pt's temp and got 98.6. Paged Minerva AreolaEric, MD to notfy him. Will give patient tylenol and monitor

## 2017-12-11 ENCOUNTER — Encounter: Payer: Self-pay | Admitting: *Deleted

## 2017-12-11 ENCOUNTER — Other Ambulatory Visit: Payer: Self-pay | Admitting: *Deleted

## 2017-12-11 NOTE — Patient Outreach (Signed)
[facility] Care Management  12/11/2017  Brenda Sellers 08-24-1981 [id number]   Subjective: Telephone call to patient's home / mobile number, spoke with patient, and HIPAA verified.  Discussed [facility] Transition of care follow up, patient voiced understanding, and is in agreement to follow up.   Patient states she is doing good, home health physical therapy started, therapy going well and has a follow up appointment with surgeon on 12/22/17. Patient states she is able to manage self care and has assistance as needed.  Patient voices understanding of medical diagnosis, surgeon, and treatment plan.  States she is accessing the following [facility] benefits: outpatient pharmacy, hospital indemnity (verbally given contact number for [employer] [phone], will file claim if appropriate, verbally given [facility] [phone] to request itemized bill), and states hospitalized covered under workers compensation, and does not need family medical leave act (FMLA). Patient states she does not have any education material, transition of care, care coordination, disease management, disease monitoring, transportation, community resource, or pharmacy needs at this time. States she is very appreciative of the follow up and is in agreement to receive [facility] information.     Objective:  Per KPN (Knowledge Performance Now, point of care tool) and chart review, patient hospitalized 12/07/17 -12/09/17. Status post RIGHT TOTAL KNEE ARTHROPLASTY on 12/07/17.  Patient also has a history of Pericardial cyst and headache.      Assessment: Received [facility] Transition of care referral on 12/08/17.   Transition of care follow up completed, no care management needs, and will proceed with case closure.      Plan: RNCM will send patient successful outreach letter, [facility] pamph[facility], and magnet. RNCM will send case closure due to follow up completed / no care  management needs request to Iverson AlaminLaura Greeson at [facility] Care [facility].     Clifton Kovacic H. Gardiner Barefootooper RN, BSN, CCM [facility] Care [facility] Telephonic CM Phone: [phone] Fax: [phone]

## 2017-12-28 ENCOUNTER — Ambulatory Visit: Payer: PRIVATE HEALTH INSURANCE | Attending: Orthopedic Surgery

## 2017-12-28 ENCOUNTER — Encounter: Payer: Self-pay | Admitting: Physical Therapy

## 2017-12-28 ENCOUNTER — Other Ambulatory Visit: Payer: Self-pay

## 2017-12-28 DIAGNOSIS — Z96651 Presence of right artificial knee joint: Secondary | ICD-10-CM | POA: Insufficient documentation

## 2017-12-28 DIAGNOSIS — R2689 Other abnormalities of gait and mobility: Secondary | ICD-10-CM | POA: Diagnosis present

## 2017-12-28 DIAGNOSIS — M25561 Pain in right knee: Secondary | ICD-10-CM | POA: Diagnosis present

## 2017-12-28 DIAGNOSIS — M6281 Muscle weakness (generalized): Secondary | ICD-10-CM | POA: Insufficient documentation

## 2017-12-28 DIAGNOSIS — G8929 Other chronic pain: Secondary | ICD-10-CM | POA: Insufficient documentation

## 2017-12-28 DIAGNOSIS — R269 Unspecified abnormalities of gait and mobility: Secondary | ICD-10-CM | POA: Insufficient documentation

## 2017-12-28 DIAGNOSIS — M25661 Stiffness of right knee, not elsewhere classified: Secondary | ICD-10-CM | POA: Diagnosis present

## 2017-12-28 NOTE — Therapy (Signed)
[facility] [address] [place], [place], [address] Phone: [phone]   Fax:  [phone]  Physical Therapy Evaluation  Patient Details  Name: Brenda Sellers MRN: [id number] Date of Birth: 1981-01-20 Referring Provider: Gean BirchwoodFrank Rowan MD   Encounter Date: 12/28/2017  PT End of Session - 12/30/17 1019    Visit Number  1    Number of Visits  9    Date for PT Re-Evaluation  02/08/18    PT Start Time  1303    PT Stop Time  1408    PT Time Calculation (min)  65 min    Activity Tolerance  Patient tolerated treatment well    Behavior During Therapy  [facility] for tasks assessed/performed       Past Medical History:  Diagnosis Date  . Anxiety   . Arthritis   . Complication of anesthesia   . GERD (gastroesophageal reflux disease)    OCC  . Headache   . Nerve pain   . Pericardial cyst   . PONV (postoperative nausea and vomiting)    Last surgery 06/2014- nausea not vomitting was pre medicated.  Marland Kitchen. PSVT (paroxysmal supraventricular tachycardia) (HCC)     Past Surgical History:  Procedure Laterality Date  . ABDOMINAL HYSTERECTOMY    . ANTERIOR CRUCIATE LIGAMENT REPAIR Left 1998   Menicus, PCL Screw  . CHOLECYSTECTOMY    . CHONDROPLASTY Right 04/10/2017   Procedure: CHONDROPLASTY;  Surgeon: Gean Birchwoodowan, Frank, MD;  Location: [facility];  Service: Orthopedics;  Laterality: Right;  . JOINT REPLACEMENT    . KNEE ARTHROSCOPY Right    Menicus  . KNEE ARTHROSCOPY Right 04/10/2017   Procedure: ARTHROSCOPY KNEE;  Surgeon: Gean Birchwoodowan, Frank, MD;  Location: [facility];  Service: Orthopedics;  Laterality: Right;  . KNEE ARTHROSCOPY WITH LATERAL MENISECTOMY Right 04/10/2017   Procedure: KNEE ARTHROSCOPY WITH  PARTIAL LATERAL MENISECTOMY;  Surgeon: Gean Birchwoodowan, Frank, MD;  Location: [facility];  Service: Orthopedics;  Laterality: Right;  . LAPAROSCOPIC TOTAL HYSTERECTOMY  07/04/14  . ORIF ELBOW FRACTURE Left 04/04/2015   Procedure: OPEN REDUCTION INTERNAL FIXATION (ORIF) LEFT ELBOW/OLECRANON FRACTURE;  Surgeon: Bradly BienenstockFred Ortmann, MD;  Location: [facility];  Service: Orthopedics;  Laterality: Left;  . TONSILLECTOMY    . TOTAL KNEE ARTHROPLASTY Right 12/07/2017   Procedure: RIGHT TOTAL KNEE ARTHROPLASTY;  Surgeon: Gean Birchwoodowan, Frank, MD;  Location: [facility];  Service: Orthopedics;  Laterality: Right;    There were no vitals filed for this visit.   Subjective Assessment - 12/30/17 1015    Subjective  Pt. states "feels pretty good" in reference to R knee pain which is a "heck of a lot better from pre surgery". Pt. had surgery 12/07/17 for a right total knee replacement. Pt. denies any aggravating pain factors since surgery secondary to "being very careful". Current pain is 5/10 which is also best. Her worst pain is 8/10 on NPS.6 Pt. states easing factors include rest, ice, and elevation. Pt. reports not sleeping well since returning home from hospital because she "can't get comfortable" sleeping 2-3 hours a night. Pt. stated goals are to improve ROM, improve sleep, and have no pain in R Knee.    Pertinent History  L knee past ACL reconstruction.     Limitations  House hold activities;Walking    How long can you stand comfortably?  20 minutes    How long can you walk comfortably?  20 minutes    Patient Stated Goals  complete ROM  of flex and ext, stop clicking in R LE, pain free    Currently in Pain?  Yes    Pain Score  5     Pain Location  Knee    Pain Orientation  Right;Anterior    Pain Descriptors / Indicators  Sharp;Operative site guarding    Pain Type  Surgical pain    Pain Radiating Towards  upper lateral portion of thigh    Pain Onset  1 to 4 weeks ago    Pain Frequency  Constant    Pain Relieving Factors  rest, ice, elevation    Effect of Pain on Daily Activities  Pt reports that cooking is the only limited task    Multiple Pain Sites  No        [facility] PT Assessment - 12/30/17 1017      Assessment   Medical Diagnosis   s/p R knee arthroscopy, medial meniscus tear repair    Referring Provider  Gean Birchwood MD    Onset Date/Surgical Date  12/07/17      Precautions   Precautions  None      Restrictions   Weight Bearing Restrictions  No      Home Environment   Living Environment  Private residence      Prior Function   Level of Independence  Independent    Vocation  Full time employment    Vocation Requirements  Works at [employer] and has to walk a lot      Cognition   Overall Cognitive Status  Within Functional Limits for tasks assessed      ROM / Strength   AROM / PROM / Strength  AROM      AROM   AROM Assessment Site  Knee    Right/Left Knee  Right    Right Knee Extension  -7    Right Knee Flexion  103             Objective measurements completed on examination: See above findings.   Pt. Walks with antalgic gait and decreased stance time on R LE and use of SPC in L UE. Pt. R Knee has mild edema compared to L with noted warmth to touch. The scar looks pink to red with some spots of increased scar tissue. Scar mobility is sufficient. Superior, lateral, and medial mobs of the patella are all pain free with good range and little to no crepitus. Inferior mob of patella is limited with no increased pain. R quad contraction was markedly less than L LE. Hamstring length WNL B hip abduction/adduction strength on R were 4+ out of 5  R knee max ROM flexion 103 and extension -7 5 time sit to stand : 14.9 sec 10 meter walk test : 9.4 sec LEFS: 33/80    TREATMENT   Ther-ex  Hooklying low load long duration stretching in knee flexion 30s hold x 3; Hooklying with R LE on green bolster at knee- extension quad sets for 5 sec hold Mini squats with 2UE assist x10          PT Education - 12/30/17 1019    Education provided  Yes    Education Details  HEP and plan of care    Person(s) Educated  Patient    Methods  Explanation;Demonstration;Handout    Comprehension  Verbalized  understanding;Returned demonstration          PT Long Term Goals - 12/30/17 1021      PT LONG TERM GOAL #1  Title  Pt. independent with HEP to increase R knee AROM to WNL as compared to L knee to improve pain-free mobility.    Baseline  12/28/17: -7 to 104    Time  6    Period  Weeks    Status  New    Target Date  02/08/18      PT LONG TERM GOAL #2   Title  Pt. will increase LEFS to >50 out of 80 to improve pain-free mobility/ normalized gait pattern.      Baseline  LEFS: 33 out of 80 (12/28/17)    Time  6    Period  Weeks    Status  New    Target Date  02/08/18      PT LONG TERM GOAL #3   Title  Pt. able to ambulate with normalized gait pattern and no assistive device on level surfaces for improved community ambulation.    Baseline  R antalgic gait pattern with use of SPC    Time  6    Period  Weeks    Status  New    Target Date  02/08/18      PT LONG TERM GOAL #4   Title  Pt. able to decrease 5 time sit to stand with no UE assist from 14.9 sec to under 12 to show increase in LE strength for safe transfers.    Baseline  on 2/4: 14.9 sec    Time  6    Period  Weeks    Status  New    Target Date  02/08/18      PT LONG TERM GOAL #5   Title  Pt. will be able to decrease best pain score from 5/10 with rest to 3/10 for 2 weeks with rest to improve pt. ability to sleep and "get comfortable"    Baseline  On 2/4: best 5/10 with rest.    Time  6    Period  Weeks    Status  New    Target Date  02/08/18             Plan - 12/30/17 1019    Clinical Impression Statement  Pt. is a pleasant 37 y.o. female that is known to this PT clinic. She is agreeable to PT plan of care and engaged during initial evaluation. Pt. had an antalgic gait with decreased stance time on the R LE, and she uses a SPC in L UE during gait. Pt. does not really use SPC but instead mainly carries it during gait and does very little weight bearing through cane. Pt. had R Knee AROM -7 degrees extension and  103 degrees flexion that improved markedly with low load long duration stretching. Pt. gross LE strength is greater than or equal to 4-/5 MMT. PT noted decreased quad activation in R LE. Pt. will benefit from PT to increase R knee AROM, R LE strengthening, and improved balance for ambulation    History and Personal Factors relevant to plan of care:  only one body system to consider, young age    Clinical Presentation  Stable    Clinical Decision Making  Low    Rehab Potential  Excellent    Clinical Impairments Affecting Rehab Potential  chronicty of Knee pain    PT Frequency  2x / week    PT Duration  6 weeks    PT Treatment/Interventions  Aquatic Therapy;Biofeedback;Cryotherapy;Retail banker;Therapeutic activities;Functional mobility training;Therapeutic exercise;Balance training;Neuromuscular re-education;Patient/family education;Dry needling;Manual techniques;Compression bandaging;Iontophoresis 4mg /ml Dexamethasone;Moist  Heat;Traction;Ultrasound;DME Instruction;Passive range of motion;Scar mobilization;Canalith Repostioning    PT Next Visit Plan  assess quad and hamstring strength/ balance test for safely removing cane/ update HEP/ promote heel strike gait.    PT Home Exercise Plan  mini squats with B UE assist. will advance at first treatment    Consulted and Agree with Plan of Care  Patient       Patient will benefit from skilled therapeutic intervention in order to improve the following deficits and impairments:  Abnormal gait, Pain, Decreased mobility, Decreased scar mobility, Decreased activity tolerance, Decreased strength, Hypomobility, Difficulty walking, Decreased balance, Decreased endurance  Visit Diagnosis: Gait difficulty  Status post right knee replacement  Muscle weakness (generalized)  Joint stiffness of knee, right     Problem List Patient Active Problem List   Diagnosis Date Noted  . Primary osteoarthritis of right hip  12/07/2017  . Osteoarthritis of right knee 12/04/2017  . Left radial head fracture 04/04/2015    This entire session was performed under direct supervision and direction of a licensed therapist/therapist assistant . I have personally read, edited and approve of the note as written.   Renaldo Harrison SPT Lynnea Maizes PT, DPT   Huprich,Jason,  12/30/2017, 10:26 AM  [place] [facility] [address] [place], [place], [address] Phone: [phone]   Fax:  [phone]  Name: ADRYAN DRUCKENMILLER MRN: [id number] Date of Birth: Feb 23, 1981

## 2017-12-31 ENCOUNTER — Ambulatory Visit: Payer: PRIVATE HEALTH INSURANCE | Admitting: Physical Therapy

## 2017-12-31 ENCOUNTER — Encounter: Payer: Self-pay | Admitting: Physical Therapy

## 2017-12-31 DIAGNOSIS — Z96651 Presence of right artificial knee joint: Secondary | ICD-10-CM

## 2017-12-31 DIAGNOSIS — M25561 Pain in right knee: Secondary | ICD-10-CM

## 2017-12-31 DIAGNOSIS — M25661 Stiffness of right knee, not elsewhere classified: Secondary | ICD-10-CM

## 2017-12-31 DIAGNOSIS — R269 Unspecified abnormalities of gait and mobility: Secondary | ICD-10-CM | POA: Diagnosis not present

## 2017-12-31 DIAGNOSIS — M6281 Muscle weakness (generalized): Secondary | ICD-10-CM

## 2017-12-31 DIAGNOSIS — G8929 Other chronic pain: Secondary | ICD-10-CM

## 2017-12-31 NOTE — Therapy (Signed)
[facility] [address] [place], [place], [address] Phone: [phone]   Fax:  [phone]  Physical Therapy Treatment  Patient Details  Name: Brenda Sellers MRN: [id number] Date of Birth: 05/09/1981 Referring Provider: Gean BirchwoodFrank Rowan MD   Encounter Date: 12/31/2017  PT End of Session - 01/02/18 0708    Visit Number  2    Number of Visits  9    Date for PT Re-Evaluation  02/08/18    PT Start Time  1248    PT Stop Time  1345    PT Time Calculation (min)  57 min    Activity Tolerance  Patient tolerated treatment well    Behavior During Therapy  [facility] for tasks assessed/performed       Past Medical History:  Diagnosis Date  . Anxiety   . Arthritis   . Complication of anesthesia   . GERD (gastroesophageal reflux disease)    OCC  . Headache   . Nerve pain   . Pericardial cyst   . PONV (postoperative nausea and vomiting)    Last surgery 06/2014- nausea not vomitting was pre medicated.  Marland Kitchen. PSVT (paroxysmal supraventricular tachycardia) (HCC)     Past Surgical History:  Procedure Laterality Date  . ABDOMINAL HYSTERECTOMY    . ANTERIOR CRUCIATE LIGAMENT REPAIR Left 1998   Menicus, PCL Screw  . CHOLECYSTECTOMY    . CHONDROPLASTY Right 04/10/2017   Procedure: CHONDROPLASTY;  Surgeon: Gean Birchwoodowan, Frank, MD;  Location: [facility];  Service: Orthopedics;  Laterality: Right;  . JOINT REPLACEMENT    . KNEE ARTHROSCOPY Right    Menicus  . KNEE ARTHROSCOPY Right 04/10/2017   Procedure: ARTHROSCOPY KNEE;  Surgeon: Gean Birchwoodowan, Frank, MD;  Location: [facility];  Service: Orthopedics;  Laterality: Right;  . KNEE ARTHROSCOPY WITH LATERAL MENISECTOMY Right 04/10/2017   Procedure: KNEE ARTHROSCOPY WITH  PARTIAL LATERAL MENISECTOMY;  Surgeon: Gean Birchwoodowan, Frank, MD;  Location: [facility];  Service: Orthopedics;  Laterality: Right;  . LAPAROSCOPIC TOTAL HYSTERECTOMY  07/04/14  . ORIF ELBOW FRACTURE Left 04/04/2015   Procedure: OPEN REDUCTION INTERNAL FIXATION (ORIF) LEFT ELBOW/OLECRANON FRACTURE;  Surgeon: Bradly BienenstockFred Ortmann, MD;  Location: [facility];  Service: Orthopedics;  Laterality: Left;  . TONSILLECTOMY    . TOTAL KNEE ARTHROPLASTY Right 12/07/2017   Procedure: RIGHT TOTAL KNEE ARTHROPLASTY;  Surgeon: Gean Birchwoodowan, Frank, MD;  Location: [facility];  Service: Orthopedics;  Laterality: Right;    There were no vitals filed for this visit.    Pt. states she is doing well. Pt. reports 5/10 R knee discomfort in knee and feels she is ready to progress off of SPC (PT agrees).        Seated R quad: 4+/5 MMT (slight R medial knee pain)/  Hamstring: 5/5 MMT.     Treatment:  There.ex.:  Supine SLR/ quad sets 10x each (good technique).  TG: knee flexion/ heel raises with calf stretches 20x each. Resisted gait in //-bars with 2BTB: 5x all 4-planes (no UE assist with mirror feedback).  Reviewed HEP/ added TKE and lunges Scifit L6 10 min.   Manual tx.:  Supine R patellar mobs. (all planes)- 10x each.   Supine R knee AA/PROM flexion and extension 6x each with increase static holds. Supine R knee flexion contract-relax 3x with holds (116 deg. Flexion).  STM around distal quad/hamstring/ incision.        Supine R knee AAROM: -3 to 116 deg. (pain tolerable) after manual stretches.  Pt.  has excellent scar/incision healing with minimal warmth and no redness noted.  Pt. demonstrates good understanding of ther.ex./ progressive HEP.  Pt. will progress off of SPC at this time with continued focus on consistent step pattern/ heel strike.  Pt. advised to use SPC on uneven terrain or if knee pain worsens.  Pt. will ice knee at home after tx. session.      PT Education - 01/02/18 0707    Education provided  Yes    Education Details  Reviewed HEP/ see new handouts    Person(s) Educated  Patient    Methods  Explanation;Demonstration;Handout    Comprehension  Verbalized understanding;Returned demonstration          PT Long  Term Goals - 12/30/17 1021      PT LONG TERM GOAL #1   Title  Pt. independent with HEP to increase R knee AROM to WNL as compared to L knee to improve pain-free mobility.    Baseline  12/28/17: -7 to 104    Time  6    Period  Weeks    Status  New    Target Date  02/08/18      PT LONG TERM GOAL #2   Title  Pt. will increase LEFS to >50 out of 80 to improve pain-free mobility/ normalized gait pattern.      Baseline  LEFS: 33 out of 80 (12/28/17)    Time  6    Period  Weeks    Status  New    Target Date  02/08/18      PT LONG TERM GOAL #3   Title  Pt. able to ambulate with normalized gait pattern and no assistive device on level surfaces for improved community ambulation.    Baseline  R antalgic gait pattern with use of SPC    Time  6    Period  Weeks    Status  New    Target Date  02/08/18      PT LONG TERM GOAL #4   Title  Pt. able to decrease 5 time sit to stand with no UE assist from 14.9 sec to under 12 to show increase in LE strength for safe transfers.    Baseline  on 2/4: 14.9 sec    Time  6    Period  Weeks    Status  New    Target Date  02/08/18      PT LONG TERM GOAL #5   Title  Pt. will be able to decrease best pain score from 5/10 with rest to 3/10 for 2 weeks with rest to improve pt. ability to sleep and "get comfortable"    Baseline  On 2/4: best 5/10 with rest.    Time  6    Period  Weeks    Status  New    Target Date  02/08/18              Patient will benefit from skilled therapeutic intervention in order to improve the following deficits and impairments:  Abnormal gait, Pain, Decreased mobility, Decreased scar mobility, Decreased activity tolerance, Decreased strength, Hypomobility, Difficulty walking, Decreased balance, Decreased endurance  Visit Diagnosis: Status post right knee replacement  Gait difficulty  Muscle weakness (generalized)  Joint stiffness of knee, right  Chronic pain of right knee     Problem List Patient Active Problem  List   Diagnosis Date Noted  . Primary osteoarthritis of right hip 12/07/2017  . Osteoarthritis of right knee 12/04/2017  .  Left radial head fracture 04/04/2015   Cammie Mcgee, PT, DPT # [phone] 01/02/2018, 7:13 AM  [facility] [address] [place], [place], [address] Phone: [phone]   Fax:  [phone]  Name: Brenda Sellers MRN: [id number] Date of Birth: 08-Dec-1980

## 2018-01-04 ENCOUNTER — Ambulatory Visit: Payer: PRIVATE HEALTH INSURANCE | Admitting: Physical Therapy

## 2018-01-04 ENCOUNTER — Encounter: Payer: Self-pay | Admitting: Physical Therapy

## 2018-01-04 DIAGNOSIS — M25561 Pain in right knee: Secondary | ICD-10-CM

## 2018-01-04 DIAGNOSIS — M25661 Stiffness of right knee, not elsewhere classified: Secondary | ICD-10-CM

## 2018-01-04 DIAGNOSIS — R2689 Other abnormalities of gait and mobility: Secondary | ICD-10-CM

## 2018-01-04 DIAGNOSIS — R269 Unspecified abnormalities of gait and mobility: Secondary | ICD-10-CM | POA: Diagnosis not present

## 2018-01-04 DIAGNOSIS — M6281 Muscle weakness (generalized): Secondary | ICD-10-CM

## 2018-01-04 DIAGNOSIS — Z96651 Presence of right artificial knee joint: Secondary | ICD-10-CM

## 2018-01-04 DIAGNOSIS — G8929 Other chronic pain: Secondary | ICD-10-CM

## 2018-01-04 NOTE — Therapy (Signed)
[facility] [address] [place], [place], [address] Phone: [phone]   Fax:  [phone]  Physical Therapy Treatment  Patient Details  Name: Brenda Sellers MRN: [id number] Date of Birth: 1981-07-20 Referring Provider: Gean Birchwood MD   Encounter Date: 01/04/2018  PT End of Session - 01/04/18 1301    Visit Number  3    Number of Visits  9    Date for PT Re-Evaluation  02/08/18    PT Start Time  1300    PT Stop Time  1345    PT Time Calculation (min)  45 min    Activity Tolerance  Patient tolerated treatment well    Behavior During Therapy  [facility] for tasks assessed/performed       Past Medical History:  Diagnosis Date  . Anxiety   . Arthritis   . Complication of anesthesia   . GERD (gastroesophageal reflux disease)    OCC  . Headache   . Nerve pain   . Pericardial cyst   . PONV (postoperative nausea and vomiting)    Last surgery 06/2014- nausea not vomitting was pre medicated.  Marland Kitchen PSVT (paroxysmal supraventricular tachycardia) (HCC)     Past Surgical History:  Procedure Laterality Date  . ABDOMINAL HYSTERECTOMY    . ANTERIOR CRUCIATE LIGAMENT REPAIR Left 1998   Menicus, PCL Screw  . CHOLECYSTECTOMY    . CHONDROPLASTY Right 04/10/2017   Procedure: CHONDROPLASTY;  Surgeon: Gean Birchwood, MD;  Location: [facility];  Service: Orthopedics;  Laterality: Right;  . JOINT REPLACEMENT    . KNEE ARTHROSCOPY Right    Menicus  . KNEE ARTHROSCOPY Right 04/10/2017   Procedure: ARTHROSCOPY KNEE;  Surgeon: Gean Birchwood, MD;  Location: [facility];  Service: Orthopedics;  Laterality: Right;  . KNEE ARTHROSCOPY WITH LATERAL MENISECTOMY Right 04/10/2017   Procedure: KNEE ARTHROSCOPY WITH  PARTIAL LATERAL MENISECTOMY;  Surgeon: Gean Birchwood, MD;  Location: [facility];  Service: Orthopedics;  Laterality: Right;  . LAPAROSCOPIC TOTAL HYSTERECTOMY  07/04/14  . ORIF ELBOW FRACTURE Left 04/04/2015   Procedure: OPEN REDUCTION INTERNAL FIXATION (ORIF) LEFT ELBOW/OLECRANON FRACTURE;  Surgeon: Bradly Bienenstock, MD;  [facility];  Service: Orthopedics;  Laterality: Left;  . TONSILLECTOMY    . TOTAL KNEE ARTHROPLASTY Right 12/07/2017   Procedure: RIGHT TOTAL KNEE ARTHROPLASTY;  Surgeon: Gean Birchwood, MD;  [facility];  Service: Orthopedics;  Laterality: Right;    There were no vitals filed for this visit.  Subjective Assessment - 01/04/18 1302    Subjective  Pt reports her main is typically medial and lateral to R knee.  Pt has been doing some scar mobilization at home which seems to be helping with her tightness. Pt is completing HEP without questions or concerns.  Pt can reciprically going up steps now.  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    Pertinent History  L knee past ACL reconstruction.     Limitations  House hold activities;Walking    How long can you stand comfortably?  20 minutes    How long can you walk comfortably?  20 minutes    Diagnostic tests  MRI    Patient Stated Goals  complete ROM of flex and ext, stop clicking in R LE, pain free    Currently in Pain?  Yes    Pain Score  5     Pain Location  Knee  Pain Orientation  Right    Pain Descriptors / Indicators  Aching    Pain Type  Surgical pain    Multiple Pain Sites  Yes    Pain Score  2    Pain Location  Knee    Pain Orientation  Left    Pain Descriptors / Indicators  Aching    Pain Type  Chronic pain       TREATMENT   Therapeutic Exercise: ?  Supine SLR 2x15 each.   Total gym level 22 BLE squats 2x15.   Total gym level 22 single leg RLE squats x10   Eccentric step downs from 4" step x20   Bil calf stretch in blue prostretch 2x30 seconds   Resisted gait in //-bars with 2BTB: 3x all 4-planes (no UE assist with mirror feedback).?     Manual Treatment:  Supine grade III-IV R patellar mobs. (all planes)- 3x30 second each direction?   Supine  R knee flexion contract-relax 4x with holds.   Hooklying R knee tibia on femur AP and PA joint mobs grade III-IV. 2x30 seconds each direction.   STM R quad and ITB, specifically vastus lateralis.?                        PT Education - 01/04/18 1300    Education provided  Yes    Education Details  Exercise technique    Person(s) Educated  Patient    Methods  Explanation;Demonstration;Verbal cues    Comprehension  Verbalized understanding;Returned demonstration;Verbal cues required;Need further instruction          PT Long Term Goals - 12/30/17 1021      PT LONG TERM GOAL #1   Title  Pt. independent with HEP to increase R knee AROM to WNL as compared to L knee to improve pain-free mobility.    Baseline  12/28/17: -7 to 104    Time  6    Period  Weeks    Status  New    Target Date  02/08/18      PT LONG TERM GOAL #2   Title  Pt. will increase LEFS to >50 out of 80 to improve pain-free mobility/ normalized gait pattern.      Baseline  LEFS: 33 out of 80 (12/28/17)    Time  6    Period  Weeks    Status  New    Target Date  02/08/18      PT LONG TERM GOAL #3   Title  Pt. able to ambulate with normalized gait pattern and no assistive device on level surfaces for improved community ambulation.    Baseline  R antalgic gait pattern with use of SPC    Time  6    Period  Weeks    Status  New    Target Date  02/08/18      PT LONG TERM GOAL #4   Title  Pt. able to decrease 5 time sit to stand with no UE assist from 14.9 sec to under 12 to show increase in LE strength for safe transfers.    Baseline  on 2/4: 14.9 sec    Time  6    Period  Weeks    Status  New    Target Date  02/08/18      PT LONG TERM GOAL #5   Title  Pt. will be able to decrease best pain score from 5/10 with rest to 3/10 for 2 weeks with  rest to improve pt. ability to sleep and "get comfortable"    Baseline  On 2/4: best 5/10 with rest.    Time  6    Period  Weeks    Status  New     Target Date  02/08/18            Plan - 01/04/18 1305    Clinical Impression Statement  Pt with no extension lag with SLR and will be able to progress this next session.  Pt responded well to STM to R quad with reports of decreased tension in R ITB and vastus lateralis region.  Pt performed single leg RLE squats on total gym x10 reps with proper form and good stability eccentrically.  Pt unable to perform descending step through pattern on stairs without compensatory hip IR so had pt perform from 4" step with proper form.  Pt will benefit from continued skilled PT interventions for improved ROM and strength.     Rehab Potential  Excellent    Clinical Impairments Affecting Rehab Potential  chronicty of Knee pain    PT Frequency  2x / week    PT Duration  6 weeks    PT Treatment/Interventions  Aquatic Therapy;Biofeedback;Cryotherapy;Retail banker;Therapeutic activities;Functional mobility training;Therapeutic exercise;Balance training;Neuromuscular re-education;Patient/family education;Dry needling;Manual techniques;Compression bandaging;Iontophoresis 4mg /ml Dexamethasone;Moist Heat;Traction;Ultrasound;DME Instruction;Passive range of motion;Scar mobilization;Canalith Repostioning    PT Next Visit Plan  Progress R knee AROM/ quad stability and gait independence (heel strike).     PT Home Exercise Plan  mini squats with B UE assist. will advance at first treatment    Consulted and Agree with Plan of Care  Patient       Patient will benefit from skilled therapeutic intervention in order to improve the following deficits and impairments:  Abnormal gait, Pain, Decreased mobility, Decreased scar mobility, Decreased activity tolerance, Decreased strength, Hypomobility, Difficulty walking, Decreased balance, Decreased endurance  Visit Diagnosis: Status post right knee replacement  Gait difficulty  Muscle weakness (generalized)  Joint stiffness of knee,  right  Chronic pain of right knee  Other abnormalities of gait and mobility  Stiffness of right knee, not elsewhere classified  Acute pain of right knee     Problem List Patient Active Problem List   Diagnosis Date Noted  . Primary osteoarthritis of right hip 12/07/2017  . Osteoarthritis of right knee 12/04/2017  . Left radial head fracture 04/04/2015    Encarnacion Chu PT, DPT 01/04/2018, 1:47 PM  [place] [facility] [address] [place], [place], [address] Phone: [phone]   Fax:  [phone]  Name: Brenda Sellers MRN: [id number] Date of Birth: 1981-01-25

## 2018-01-07 ENCOUNTER — Ambulatory Visit: Payer: PRIVATE HEALTH INSURANCE | Admitting: Physical Therapy

## 2018-01-07 ENCOUNTER — Encounter: Payer: Self-pay | Admitting: Physical Therapy

## 2018-01-07 DIAGNOSIS — R269 Unspecified abnormalities of gait and mobility: Secondary | ICD-10-CM | POA: Diagnosis not present

## 2018-01-07 DIAGNOSIS — Z96651 Presence of right artificial knee joint: Secondary | ICD-10-CM

## 2018-01-07 DIAGNOSIS — M25661 Stiffness of right knee, not elsewhere classified: Secondary | ICD-10-CM

## 2018-01-07 DIAGNOSIS — M6281 Muscle weakness (generalized): Secondary | ICD-10-CM

## 2018-01-07 NOTE — Therapy (Signed)
[place] [facility] [address]. [place], [place], [address] Phone: [phone]   Fax:  [phone]  Physical Therapy Treatment  Patient Details  Name: ALEKA TWITTY MRN: [id number] Date of Birth: March 06, 1981 Referring Provider: Gean Birchwood MD   Encounter Date: 01/07/2018  PT End of Session - 01/07/18 1854    Visit Number  4    Number of Visits  9    Date for PT Re-Evaluation  02/08/18    PT Start Time  1301    PT Stop Time  1355    PT Time Calculation (min)  54 min    Activity Tolerance  Patient tolerated treatment well    Behavior During Therapy  [facility] for tasks assessed/performed       Past Medical History:  Diagnosis Date  . Anxiety   . Arthritis   . Complication of anesthesia   . GERD (gastroesophageal reflux disease)    OCC  . Headache   . Nerve pain   . Pericardial cyst   . PONV (postoperative nausea and vomiting)    Last surgery 06/2014- nausea not vomitting was pre medicated.  Marland Kitchen PSVT (paroxysmal supraventricular tachycardia) (HCC)     Past Surgical History:  Procedure Laterality Date  . ABDOMINAL HYSTERECTOMY    . ANTERIOR CRUCIATE LIGAMENT REPAIR Left 1998   Menicus, PCL Screw  . CHOLECYSTECTOMY    . CHONDROPLASTY Right 04/10/2017   Procedure: CHONDROPLASTY;  Surgeon: Gean Birchwood, MD;  Location: [facility];  Service: Orthopedics;  Laterality: Right;  . JOINT REPLACEMENT    . KNEE ARTHROSCOPY Right    Menicus  . KNEE ARTHROSCOPY Right 04/10/2017   Procedure: ARTHROSCOPY KNEE;  Surgeon: Gean Birchwood, MD;  Location: [facility];  Service: Orthopedics;  Laterality: Right;  . KNEE ARTHROSCOPY WITH LATERAL MENISECTOMY Right 04/10/2017   Procedure: KNEE ARTHROSCOPY WITH  PARTIAL LATERAL MENISECTOMY;  Surgeon: Gean Birchwood, MD;  Location: [facility];  Service: Orthopedics;  Laterality: Right;  . LAPAROSCOPIC TOTAL HYSTERECTOMY  07/04/14  . ORIF ELBOW FRACTURE Left 04/04/2015    Procedure: OPEN REDUCTION INTERNAL FIXATION (ORIF) LEFT ELBOW/OLECRANON FRACTURE;  Surgeon: Bradly Bienenstock, MD;  [facility];  Service: Orthopedics;  Laterality: Left;  . TONSILLECTOMY    . TOTAL KNEE ARTHROPLASTY Right 12/07/2017   Procedure: RIGHT TOTAL KNEE ARTHROPLASTY;  Surgeon: Gean Birchwood, MD;  [facility];  Service: Orthopedics;  Laterality: Right;    There were no vitals filed for this visit.  Subjective Assessment - 01/07/18 1852    Subjective  Pt. states feeling discomfort in R knee after mini lunges, but improved today. Current pain 7/10 NPS. Pt. states compliance with HEP and a desire to "get better and back to work".    Pertinent History  L knee past ACL reconstruction.     Limitations  House hold activities;Walking    How long can you stand comfortably?  20 minutes    How long can you walk comfortably?  20 minutes    Diagnostic tests  MRI    Patient Stated Goals  complete ROM of flex and ext, stop clicking in R LE, pain free    Currently in Pain?  Yes    Pain Score  7     Pain Location  Knee    Pain Orientation  Right;Anterior;Medial    Pain Descriptors / Indicators  Grimacing;Sharp;Discomfort    Pain Type  Surgical pain    Pain Onset  1 to  4 weeks ago           There.ex: Scifit with focus on knee flexion with slight increase every 2 minute (10 min) Lunge with R leg in front x10 Mini squat in mirror with 2 UE (focus on no lateral weight shift) Standing weight shift in //-bars on flat side of BOSU x 20  (2 UE assist to get on and off) //-bars flat side of BOSU mini squat with 2 UE assist to get on and off  6" step up and step back R/L with focus on knee control x10 3" step down with focus on knee control mirror assisted for visual aid 2 UE light touch 2 sets x10 B  Manual: R Knee patella lat/med/sup/inf mob with 10 sec hold x10 each R knee flexion stretch for 30 sec hold and contract (5sec) relax (20sec) x5       PT Education - 01/07/18 1853     Education provided  Yes    Education Details  exercise technique    Person(s) Educated  Patient    Methods  Explanation;Demonstration;Verbal cues;Tactile cues    Comprehension  Verbalized understanding;Returned demonstration          PT Long Term Goals - 12/30/17 1021      PT LONG TERM GOAL #1   Title  Pt. independent with HEP to increase R knee AROM to WNL as compared to L knee to improve pain-free mobility.    Baseline  12/28/17: -7 to 104    Time  6    Period  Weeks    Status  New    Target Date  02/08/18      PT LONG TERM GOAL #2   Title  Pt. will increase LEFS to >50 out of 80 to improve pain-free mobility/ normalized gait pattern.      Baseline  LEFS: 33 out of 80 (12/28/17)    Time  6    Period  Weeks    Status  New    Target Date  02/08/18      PT LONG TERM GOAL #3   Title  Pt. able to ambulate with normalized gait pattern and no assistive device on level surfaces for improved community ambulation.    Baseline  R antalgic gait pattern with use of SPC    Time  6    Period  Weeks    Status  New    Target Date  02/08/18      PT LONG TERM GOAL #4   Title  Pt. able to decrease 5 time sit to stand with no UE assist from 14.9 sec to under 12 to show increase in LE strength for safe transfers.    Baseline  on 2/4: 14.9 sec    Time  6    Period  Weeks    Status  New    Target Date  02/08/18      PT LONG TERM GOAL #5   Title  Pt. will be able to decrease best pain score from 5/10 with rest to 3/10 for 2 weeks with rest to improve pt. ability to sleep and "get comfortable"    Baseline  On 2/4: best 5/10 with rest.    Time  6    Period  Weeks    Status  New    Target Date  02/08/18            Plan - 01/07/18 1856    Clinical Impression Statement  Pt. had pain  in R knee with L knee lunge, so PT advised to only do lunges with R knee in front. Pt. continues to improve R knee flexion, and benefits from STM to scar and quad/knee for knee flexion and patella mobility.  Pt. has slight left weight shift with mini squats but was able to correct with mirror and tactile queing. Pt. still need verbal and tactile queing for most tasks in regards to B knee valgus and ER of R LE.    Clinical Presentation  Stable    Clinical Decision Making  Low    Rehab Potential  Excellent    Clinical Impairments Affecting Rehab Potential  chronicty of Knee pain    PT Frequency  2x / week    PT Duration  6 weeks    PT Treatment/Interventions  Aquatic Therapy;Biofeedback;Cryotherapy;Retail banker;Therapeutic activities;Functional mobility training;Therapeutic exercise;Balance training;Neuromuscular re-education;Patient/family education;Dry needling;Manual techniques;Compression bandaging;Iontophoresis 4mg /ml Dexamethasone;Moist Heat;Traction;Ultrasound;DME Instruction;Passive range of motion;Scar mobilization;Canalith Repostioning    PT Next Visit Plan  Progress R knee AROM/ quad stability and gait independence (heel strike).     Consulted and Agree with Plan of Care  Patient       Patient will benefit from skilled therapeutic intervention in order to improve the following deficits and impairments:  Abnormal gait, Pain, Decreased mobility, Decreased scar mobility, Decreased activity tolerance, Decreased strength, Hypomobility, Difficulty walking, Decreased balance, Decreased endurance  Visit Diagnosis: Status post right knee replacement  Gait difficulty  Muscle weakness (generalized)  Joint stiffness of knee, right     Problem List Patient Active Problem List   Diagnosis Date Noted  . Primary osteoarthritis of right hip 12/07/2017  . Osteoarthritis of right knee 12/04/2017  . Left radial head fracture 04/04/2015   Cammie Mcgee, PT, DPT # [id number] Renaldo [place], SPT 01/07/2018, 7:02 PM  [place] [facility] [address] [place], [place], [address] Phone: [phone]   Fax:   [phone]  Name: KAARI ZEIGLER MRN: [id number] Date of Birth: 11/22/1981

## 2018-01-11 ENCOUNTER — Ambulatory Visit: Payer: PRIVATE HEALTH INSURANCE | Admitting: Physical Therapy

## 2018-01-14 ENCOUNTER — Ambulatory Visit: Payer: PRIVATE HEALTH INSURANCE | Admitting: Physical Therapy

## 2018-01-15 DIAGNOSIS — J40 Bronchitis, not specified as acute or chronic: Secondary | ICD-10-CM | POA: Diagnosis not present

## 2018-01-18 ENCOUNTER — Ambulatory Visit: Payer: PRIVATE HEALTH INSURANCE | Admitting: Physical Therapy

## 2018-01-18 ENCOUNTER — Encounter: Payer: Self-pay | Admitting: Physical Therapy

## 2018-01-18 DIAGNOSIS — R269 Unspecified abnormalities of gait and mobility: Secondary | ICD-10-CM | POA: Diagnosis not present

## 2018-01-18 DIAGNOSIS — M6281 Muscle weakness (generalized): Secondary | ICD-10-CM

## 2018-01-18 DIAGNOSIS — M25561 Pain in right knee: Secondary | ICD-10-CM

## 2018-01-18 DIAGNOSIS — Z96651 Presence of right artificial knee joint: Secondary | ICD-10-CM

## 2018-01-18 DIAGNOSIS — G8929 Other chronic pain: Secondary | ICD-10-CM

## 2018-01-18 DIAGNOSIS — M25661 Stiffness of right knee, not elsewhere classified: Secondary | ICD-10-CM

## 2018-01-18 NOTE — Therapy (Signed)
[place] [facility] [address]. [place], [place], [address] Phone: [phone]   Fax:  [phone]  Physical Therapy Treatment  Patient Details  Name: Brenda Sellers MRN: [id number] Date of Birth: 02-11-1981 Referring Provider: Frederik Pear MD   Encounter Date: 01/18/2018  PT End of Session - 01/18/18 1309    Visit Number  5    Number of Visits  9    Date for PT Re-Evaluation  02/08/18    PT Start Time  1254    PT Stop Time  8937    PT Time Calculation (min)  55 min    Activity Tolerance  Patient tolerated treatment well    Behavior During Therapy  [facility] for tasks assessed/performed       Past Medical History:  Diagnosis Date  . Anxiety   . Arthritis   . Complication of anesthesia   . GERD (gastroesophageal reflux disease)    OCC  . Headache   . Nerve pain   . Pericardial cyst   . PONV (postoperative nausea and vomiting)    Last surgery 06/2014- nausea not vomitting was pre medicated.  Marland Kitchen PSVT (paroxysmal supraventricular tachycardia) ([place])     Past Surgical History:  Procedure Laterality Date  . ABDOMINAL HYSTERECTOMY    . ANTERIOR CRUCIATE LIGAMENT REPAIR Left 1998   Menicus, PCL Screw  . CHOLECYSTECTOMY    . CHONDROPLASTY Right 04/10/2017   Procedure: CHONDROPLASTY;  Surgeon: Frederik Pear, MD;  Location: [place];  Service: Orthopedics;  Laterality: Right;  . JOINT REPLACEMENT    . KNEE ARTHROSCOPY Right    Menicus  . KNEE ARTHROSCOPY Right 04/10/2017   Procedure: ARTHROSCOPY KNEE;  Surgeon: Frederik Pear, MD;  Location: [place];  Service: Orthopedics;  Laterality: Right;  . KNEE ARTHROSCOPY WITH LATERAL MENISECTOMY Right 04/10/2017   Procedure: KNEE ARTHROSCOPY WITH  PARTIAL LATERAL MENISECTOMY;  Surgeon: Frederik Pear, MD;  Location: [place];  Service: Orthopedics;  Laterality: Right;  . LAPAROSCOPIC TOTAL HYSTERECTOMY  07/04/14  . ORIF ELBOW FRACTURE Left 04/04/2015    Procedure: OPEN REDUCTION INTERNAL FIXATION (ORIF) LEFT ELBOW/OLECRANON FRACTURE;  Surgeon: Iran Planas, MD;  Location: [place];  Service: Orthopedics;  Laterality: Left;  . TONSILLECTOMY    . TOTAL KNEE ARTHROPLASTY Right 12/07/2017   Procedure: RIGHT TOTAL KNEE ARTHROPLASTY;  Surgeon: Frederik Pear, MD;  Location: [place];  Service: Orthopedics;  Laterality: Right;    There were no vitals filed for this visit.  Subjective Assessment - 01/18/18 1252    Subjective  Pt. states she feels warmth/ discomfort over R lateral aspect of knee.  Pt. c/o 6/10 R knee pain prior to tx. session.  Pt. feeling better but still has bronchitis.        Pertinent History  L knee past ACL reconstruction.     Limitations  House hold activities;Walking    How long can you stand comfortably?  20 minutes    How long can you walk comfortably?  20 minutes    Diagnostic tests  MRI    Patient Stated Goals  complete ROM of flex and ext, stop clicking in R LE, pain free    Currently in Pain?  Yes    Pain Score  6     Pain Location  Knee    Pain Orientation  Right    Pain Descriptors / Indicators  Aching       LEFS: 34 out of 80 (no significant  improvement).  There.ex: Scifit L6 with focus on knee flexion with slight increase every 2 minute (10 min)- warm-up/no charge. TG knee flexion (toe in/out/midline) 20x each/ heel raises 20x with added gastroc stretches. BOSU step ups/ downs forward and lateral 10x2 each.   Supine SLR/ knee flexion/ quad sets 10x each. Sidelying hip abd./ prone hip extension 10x2. Each.   Reviewed HEP.   TM walking 2.2-2.6 mph 0-5% incline for 6 min. (gait assessment).   Manual: R Knee patella lat/med/sup/inf mob with 10 sec hold x10 each R knee flexion stretch for 30 sec hold and contract (5sec) relax (20sec) x5 Reassessment of R knee AROM.       PT Long Term Goals - 01/19/18 0809      PT LONG TERM GOAL #1   Title  Pt. independent with HEP to increase R knee AROM to WNL as compared  to L knee to improve pain-free mobility.    Baseline  12/28/17: -7 to 104.  2/25: -3 to 123 deg.    Time  6    Period  Weeks    Status  Partially Met    Target Date  02/08/18      PT LONG TERM GOAL #2   Title  Pt. will increase LEFS to >50 out of 80 to improve pain-free mobility/ normalized gait pattern.      Baseline  LEFS: 33 out of 80 (12/28/17).  LEFS: 34 out of 80 (2/25).     Time  6    Period  Weeks    Status  Not Met    Target Date  02/08/18      PT LONG TERM GOAL #3   Title  Pt. able to ambulate with normalized gait pattern and no assistive device on level surfaces for improved community ambulation.    Baseline  marked improvement in gait pattern.    Time  6    Period  Weeks    Status  Achieved      PT LONG TERM GOAL #4   Title  Pt. able to decrease 5 time sit to stand with no UE assist from 14.9 sec to under 12 to show increase in LE strength for safe transfers.    Baseline  on 2/4: 14.9 sec    Time  6    Period  Weeks    Status  On-going    Target Date  02/08/18      PT LONG TERM GOAL #5   Title  Pt. will be able to decrease best pain score from 5/10 with rest to 3/10 for 2 weeks with rest to improve pt. ability to sleep and "get comfortable"    Baseline  On 2/4: best 5/10 with rest.   On 2/25: 6/10    Time  6    Period  Weeks    Status  Not Met          Plan - 01/18/18 1310    Clinical Impression Statement  Pt. continues to demonstrate consistent increases in R knee AROM (supine position): -3 to 123 deg. R quad/hamstring strength grossly 5/5 MMT and R hip flexion 4+/5 MMT.  No increase c/o knee pain with R LE stretching/ knee flexion.  No change in patients LEFS: 34/80.  Pt. ambulates with more normalized gait pattern with consistent R hip/knee flexion and heel strike.  Pt. remains concerned about returning to work at this time due to R knee limitations.  Pt. returns to MD tomorrow for reassessment.  Clinical Presentation  Stable    Clinical Decision Making   Low    Rehab Potential  Excellent    Clinical Impairments Affecting Rehab Potential  chronicty of Knee pain    PT Frequency  2x / week    PT Duration  6 weeks    PT Treatment/Interventions  Aquatic Therapy;Biofeedback;Cryotherapy;Occupational psychologist;Therapeutic activities;Functional mobility training;Therapeutic exercise;Balance training;Neuromuscular re-education;Patient/family education;Dry needling;Manual techniques;Compression bandaging;Iontophoresis 57m/ml Dexamethasone;Moist Heat;Traction;Ultrasound;DME Instruction;Passive range of motion;Scar mobilization;Canalith Repostioning    PT Next Visit Plan  Progress R knee AROM/ quad stability and gait independence (heel strike).     PT Home Exercise Plan  mini squats with B UE assist. will advance at first treatment       Patient will benefit from skilled therapeutic intervention in order to improve the following deficits and impairments:  Abnormal gait, Pain, Decreased mobility, Decreased scar mobility, Decreased activity tolerance, Decreased strength, Hypomobility, Difficulty walking, Decreased balance, Decreased endurance  Visit Diagnosis: Status post right knee replacement  Gait difficulty  Muscle weakness (generalized)  Joint stiffness of knee, right  Chronic pain of right knee     Problem List Patient Active Problem List   Diagnosis Date Noted  . Primary osteoarthritis of right hip 12/07/2017  . Osteoarthritis of right knee 12/04/2017  . Left radial head fracture 04/04/2015   MPura Spice PT, DPT # [id number]/26/2019, 8:12 AM  [facility]MA[place] [address]Phone: [phone]  Fax:  [phone] Name: Brenda VACCARELLAMRN: [id number] of Birth: [birth year]

## 2018-01-21 ENCOUNTER — Ambulatory Visit: Payer: PRIVATE HEALTH INSURANCE | Admitting: Physical Therapy

## 2018-01-28 DIAGNOSIS — G541 Lumbosacral plexus disorders: Secondary | ICD-10-CM | POA: Diagnosis not present

## 2018-01-28 DIAGNOSIS — E538 Deficiency of other specified B group vitamins: Secondary | ICD-10-CM | POA: Diagnosis not present

## 2018-02-02 ENCOUNTER — Encounter: Payer: Self-pay | Admitting: Physical Therapy

## 2018-02-02 ENCOUNTER — Ambulatory Visit: Payer: PRIVATE HEALTH INSURANCE | Attending: Orthopedic Surgery | Admitting: Physical Therapy

## 2018-02-02 DIAGNOSIS — Z96651 Presence of right artificial knee joint: Secondary | ICD-10-CM | POA: Insufficient documentation

## 2018-02-02 DIAGNOSIS — G8929 Other chronic pain: Secondary | ICD-10-CM | POA: Diagnosis present

## 2018-02-02 DIAGNOSIS — M25661 Stiffness of right knee, not elsewhere classified: Secondary | ICD-10-CM | POA: Diagnosis present

## 2018-02-02 DIAGNOSIS — M6281 Muscle weakness (generalized): Secondary | ICD-10-CM | POA: Diagnosis present

## 2018-02-02 DIAGNOSIS — R269 Unspecified abnormalities of gait and mobility: Secondary | ICD-10-CM

## 2018-02-02 DIAGNOSIS — M25561 Pain in right knee: Secondary | ICD-10-CM | POA: Insufficient documentation

## 2018-02-02 NOTE — Therapy (Signed)
[facility] [address] [place], [place], [address] Phone: [phone]   Fax:  [phone]  Physical Therapy Treatment  Patient Details  Name: Brenda Sellers MRN: [id number] Date of Birth: Nov 09, 1981 Referring Provider: Frederik Pear MD   Encounter Date: 02/02/2018    Treatment: 6 of 9.  Recert date: 5/40/08   Past Medical History:  Diagnosis Date  . Anxiety   . Arthritis   . Complication of anesthesia   . GERD (gastroesophageal reflux disease)    OCC  . Headache   . Nerve pain   . Pericardial cyst   . PONV (postoperative nausea and vomiting)    Last surgery 06/2014- nausea not vomitting was pre medicated.  Marland Kitchen PSVT (paroxysmal supraventricular tachycardia) ([facility])     Past Surgical History:  Procedure Laterality Date  . ABDOMINAL HYSTERECTOMY    . ANTERIOR CRUCIATE LIGAMENT REPAIR Left 1998   Menicus, PCL Screw  . CHOLECYSTECTOMY    . CHONDROPLASTY Right 04/10/2017   Procedure: CHONDROPLASTY;  Surgeon: Frederik Pear, MD;  Location: [place];  Service: Orthopedics;  Laterality: Right;  . JOINT REPLACEMENT    . KNEE ARTHROSCOPY Right    Menicus  . KNEE ARTHROSCOPY Right 04/10/2017   Procedure: ARTHROSCOPY KNEE;  Surgeon: Frederik Pear, MD;  Location: [place];  Service: Orthopedics;  Laterality: Right;  . KNEE ARTHROSCOPY WITH LATERAL MENISECTOMY Right 04/10/2017   Procedure: KNEE ARTHROSCOPY WITH  PARTIAL LATERAL MENISECTOMY;  Surgeon: Frederik Pear, MD;  Location: [place];  Service: Orthopedics;  Laterality: Right;  . LAPAROSCOPIC TOTAL HYSTERECTOMY  07/04/14  . ORIF ELBOW FRACTURE Left 04/04/2015   Procedure: OPEN REDUCTION INTERNAL FIXATION (ORIF) LEFT ELBOW/OLECRANON FRACTURE;  Surgeon: Iran Planas, MD;  Location: [place];  Service: Orthopedics;  Laterality: Left;  . TONSILLECTOMY    . TOTAL KNEE ARTHROPLASTY Right 12/07/2017   Procedure: RIGHT TOTAL KNEE ARTHROPLASTY;   Surgeon: Frederik Pear, MD;  Location: [place];  Service: Orthopedics;  Laterality: Right;    There were no vitals filed for this visit.    Pt. reports MD is happy with pts. progress. No c/o pain at this time. Pt. is on a 2nd antibiotic for upper respiratory infection. No scheduled RTW date at this time.     There.ex:  Scifit L7 B LE only (10 min)- warm-up/no charge. TG knee flexion (toe in/out/midline) 20x each/ heel raises 20x with added gastroc stretches. BOSU step ups/ downs forward and lateral 10x2 each.   Supine SLR/ knee flexion/ quad sets 10x each. Sidelying hip abd./ prone hip extension 10x2. Each.   Stairs 10x with no UE assist.  1/2 foam balance with ball toss. SLS (no difficulty). Sled push/ pull 100# up/down hallway 45 feet x 2. Reviewed HEP/ progressed.    Manual: -2 to 124 deg.  0 deg knee ext. With gentle overpressure.       Pt. ambulates into PT clinic with more normalized gait pattern and consistent heel strike. R knee AROM: -2 to 124 deg. R knee ext. 0 deg. with gentle OP. Pt. will continue to benefit from work-related/ specific tasks to promote return to work. Pt. able to push/pull 100# sled (45# with friction coefficient). Good SLS/ balance with higher level tasks. Reassess pts. goals next tx. session.   PT Long Term Goals - 01/19/18 0809      PT LONG TERM GOAL #1   Title  Pt. independent with HEP to increase R knee AROM  to WNL as compared to L knee to improve pain-free mobility.    Baseline  12/28/17: -7 to 104.  2/25: -3 to 123 deg.    Time  6    Period  Weeks    Status  Partially Met    Target Date  02/08/18      PT LONG TERM GOAL #2   Title  Pt. will increase LEFS to >50 out of 80 to improve pain-free mobility/ normalized gait pattern.      Baseline  LEFS: 33 out of 80 (12/28/17).  LEFS: 34 out of 80 (2/25).     Time  6    Period  Weeks    Status  Not Met    Target Date  02/08/18      PT LONG TERM GOAL #3   Title  Pt. able to ambulate with  normalized gait pattern and no assistive device on level surfaces for improved community ambulation.    Baseline  marked improvement in gait pattern.    Time  6    Period  Weeks    Status  Achieved      PT LONG TERM GOAL #4   Title  Pt. able to decrease 5 time sit to stand with no UE assist from 14.9 sec to under 12 to show increase in LE strength for safe transfers.    Baseline  on 2/4: 14.9 sec    Time  6    Period  Weeks    Status  On-going    Target Date  02/08/18      PT LONG TERM GOAL #5   Title  Pt. will be able to decrease best pain score from 5/10 with rest to 3/10 for 2 weeks with rest to improve pt. ability to sleep and "get comfortable"    Baseline  On 2/4: best 5/10 with rest.   On 2/25: 6/10    Time  6    Period  Weeks    Status  Not Met              Patient will benefit from skilled therapeutic intervention in order to improve the following deficits and impairments:  Abnormal gait, Pain, Decreased mobility, Decreased scar mobility, Decreased activity tolerance, Decreased strength, Hypomobility, Difficulty walking, Decreased balance, Decreased endurance  Visit Diagnosis: Status post right knee replacement  Gait difficulty  Muscle weakness (generalized)  Joint stiffness of knee, right  Chronic pain of right knee     Problem List Patient Active Problem List   Diagnosis Date Noted  . Primary osteoarthritis of right hip 12/07/2017  . Osteoarthritis of right knee 12/04/2017  . Left radial head fracture 04/04/2015   Pura Spice, PT, DPT # [phone] 02/06/2018, 9:06 AM  [facility] [address] [place], [place], [address] Phone: [phone]   Fax:  [phone]  Name: Brenda Sellers MRN: [id number] Date of Birth: Dec 17, 1980

## 2018-02-08 ENCOUNTER — Ambulatory Visit: Payer: PRIVATE HEALTH INSURANCE | Admitting: Physical Therapy

## 2018-02-09 ENCOUNTER — Ambulatory Visit: Payer: PRIVATE HEALTH INSURANCE | Admitting: Physical Therapy

## 2018-02-11 ENCOUNTER — Ambulatory Visit: Payer: PRIVATE HEALTH INSURANCE | Admitting: Physical Therapy

## 2018-02-15 ENCOUNTER — Ambulatory Visit: Payer: PRIVATE HEALTH INSURANCE | Admitting: Physical Therapy

## 2018-02-15 ENCOUNTER — Encounter: Payer: Self-pay | Admitting: Physical Therapy

## 2018-02-15 DIAGNOSIS — Z96651 Presence of right artificial knee joint: Secondary | ICD-10-CM

## 2018-02-15 DIAGNOSIS — G8929 Other chronic pain: Secondary | ICD-10-CM

## 2018-02-15 DIAGNOSIS — R269 Unspecified abnormalities of gait and mobility: Secondary | ICD-10-CM

## 2018-02-15 DIAGNOSIS — M6281 Muscle weakness (generalized): Secondary | ICD-10-CM

## 2018-02-15 DIAGNOSIS — M25561 Pain in right knee: Secondary | ICD-10-CM

## 2018-02-15 DIAGNOSIS — M25661 Stiffness of right knee, not elsewhere classified: Secondary | ICD-10-CM

## 2018-02-15 NOTE — Therapy (Signed)
[facility] [address] [place], [place], [address] Phone: [phone]   Fax:  [phone]  Physical Therapy Treatment  Patient Details  Name: Brenda Sellers MRN: [id number] Date of Birth: [birth year] Referring Provider: Gean Birchwood MD   Encounter Date: 02/15/2018  PT End of Session - 02/15/18 1512    Visit Number  7    Number of Visits  15    Date for PT Re-Evaluation  03/15/18    PT Start Time  1510    PT Stop Time  1610    PT Time Calculation (min)  60 min    Activity Tolerance  Patient tolerated treatment well    Behavior During Therapy  [facility] for tasks assessed/performed       Past Medical History:  Diagnosis Date  . Anxiety   . Arthritis   . Complication of anesthesia   . GERD (gastroesophageal reflux disease)    OCC  . Headache   . Nerve pain   . Pericardial cyst   . PONV (postoperative nausea and vomiting)    Last surgery 06/2014- nausea not vomitting was pre medicated.  Marland Kitchen PSVT (paroxysmal supraventricular tachycardia) ([facility])     Past Surgical History:  Procedure Laterality Date  . ABDOMINAL HYSTERECTOMY    . ANTERIOR CRUCIATE LIGAMENT REPAIR Left 1998   Menicus, PCL Screw  . CHOLECYSTECTOMY    . CHONDROPLASTY Right 04/10/2017   Procedure: CHONDROPLASTY;  Surgeon: Gean Birchwood, MD;  Location: [facility];  Service: Orthopedics;  Laterality: Right;  . JOINT REPLACEMENT    . KNEE ARTHROSCOPY Right    Menicus  . KNEE ARTHROSCOPY Right 04/10/2017   Procedure: ARTHROSCOPY KNEE;  Surgeon: Gean Birchwood, MD;  Location: [facility];  Service: Orthopedics;  Laterality: Right;  . KNEE ARTHROSCOPY WITH LATERAL MENISECTOMY Right 04/10/2017   Procedure: KNEE ARTHROSCOPY WITH  PARTIAL LATERAL MENISECTOMY;  Surgeon: Gean Birchwood, MD;  [facility];  Service: Orthopedics;  Laterality: Right;  . LAPAROSCOPIC TOTAL HYSTERECTOMY  07/04/14  . ORIF ELBOW FRACTURE Left 04/04/2015    Procedure: OPEN REDUCTION INTERNAL FIXATION (ORIF) LEFT ELBOW/OLECRANON FRACTURE;  Surgeon: Bradly Bienenstock, MD;  [facility];  Service: Orthopedics;  Laterality: Left;  . TONSILLECTOMY    . TOTAL KNEE ARTHROPLASTY Right 12/07/2017   Procedure: RIGHT TOTAL KNEE ARTHROPLASTY;  Surgeon: Gean Birchwood, MD;  [facility];  Service: Orthopedics;  Laterality: Right;    There were no vitals filed for this visit.  Subjective Assessment - 02/15/18 1504    Subjective  Pt. states she is returning to work 8-hour days [employer]) tomorrow.  Pt. reports slight swelling in R medial knee over past week.  No pain reported at this time.  Pt. reports initial first step walking is still somewhat difficult and turning over in bed can be difficult.  Pt. walked 4 blocks in [place] with no issues.      Pertinent History  L knee past ACL reconstruction.     Limitations  House hold activities;Walking    How long can you stand comfortably?  20 minutes    How long can you walk comfortably?  20 minutes    Diagnostic tests  MRI    Patient Stated Goals  complete ROM of flex and ext, stop clicking in R LE, pain free    Currently in Pain?  No/denies       There.ex:  ScifitL7B LE only (12 min)- warm-up/no charge. TG knee  flexion (toe in/out/midline) 20x each/ heel raises 20x with added gastroc stretches/ single leg squats 10x L/R (muscle fasciculations noted).  Supine SLR/ knee flexion/ quad sets 10x each. Sidelying hip abd./ prone hip extension 10x2. Each.  Stairs 4x with recip. Pattern and with no UE assist. Sled push/ pull 75# up/down hallway 45 feet x 3. TM walking at 2.2 mph for 10 min. With intermittent incline (5-10%).  No UE assist. Reviewed HEP.  Discussed work-related tasks.        PT Long Term Goals - 02/15/18 1513      PT LONG TERM GOAL #1   Title  Pt. independent with HEP to increase R knee AROM to WNL as compared to L knee to improve pain-free mobility.    Baseline  12/28/17: -7 to 104.   2/25: -3 to 123 deg.  3/25: 0 to 125 deg.     Time  4    Period  Weeks    Status  Achieved    Target Date  03/15/18      PT LONG TERM GOAL #2   Title  Pt. will increase LEFS to >50 out of 80 to improve pain-free mobility/ normalized gait pattern.      Baseline  LEFS: 33 out of 80 (12/28/17).  LEFS: 34 out of 80 (2/25).  3/25: 55 out of 80    Time  4    Period  Weeks    Status  Achieved    Target Date  03/15/18      PT LONG TERM GOAL #3   Title  Pt. able to ambulate with normalized gait pattern and no assistive device on level surfaces for improved community ambulation.    Baseline  Pt. ambulates for 10 minutes with normalized gait pattern.     Time  4    Period  Weeks    Status  Achieved    Target Date  03/15/18      PT LONG TERM GOAL #4   Title  Pt. able to decrease 5 time sit to stand with no UE assist from 14.9 sec to under 12 to show increase in LE strength for safe transfers.    Baseline  on 2/4: 14.9 sec.   3/25:  6.9 sec. (no issues).    Time  4    Period  Weeks    Status  Achieved    Target Date  03/15/18      PT LONG TERM GOAL #5   Title  Pt. will be able to decrease best pain score from 5/10 with rest to 3/10 for 2 weeks with rest to improve pt. ability to sleep and "get comfortable"    Baseline  On 2/4: best 5/10 with rest.   On 2/25: 6/10.   3/25:  2-3/10 R lateral knee    Time  4    Period  Weeks    Status  Achieved    Target Date  03/15/18            Plan - 02/15/18 1513    Clinical Impression Statement  Pt. has progressed well towards all PT goals over past several weeks.  Pt. presents with excellent R knee AROM: 0 to 125 deg.  Minimal R lateral knee swelling/ tenderness with palpation.  Good R patellar tracking/ mobility.  B LE muscle strength grossly 5/5 MMT.   Pt. ambulates with normalized gait pattern and able to ascend/descend stairs with recip. pattern and no issues.  PT recommends discharge from PT  at this time with focus on independent HEP/ walking  program at this time.      Clinical Presentation  Stable    Clinical Decision Making  Low    Rehab Potential  Excellent    Clinical Impairments Affecting Rehab Potential  chronicty of Knee pain    PT Frequency  2x / week    PT Duration  6 weeks    PT Treatment/Interventions  Aquatic Therapy;Biofeedback;Cryotherapy;Retail bankerlectrical Stimulation;Gait training;Stair training;Therapeutic activities;Functional mobility training;Therapeutic exercise;Balance training;Neuromuscular re-education;Patient/family education;Dry needling;Manual techniques;Compression bandaging;Iontophoresis 4mg /ml Dexamethasone;Moist Heat;Traction;Ultrasound;DME Instruction;Passive range of motion;Scar mobilization;Canalith Repostioning    PT Next Visit Plan  Discuss MD f/u appt.  Continue PT with focus on RTW without limitations.      PT Home Exercise Plan  mini squats with B UE assist. will advance at first treatment    Consulted and Agree with Plan of Care  Patient       Patient will benefit from skilled therapeutic intervention in order to improve the following deficits and impairments:  Abnormal gait, Pain, Decreased mobility, Decreased scar mobility, Decreased activity tolerance, Decreased strength, Hypomobility, Difficulty walking, Decreased balance, Decreased endurance  Visit Diagnosis: Status post right knee replacement  Gait difficulty  Muscle weakness (generalized)  Joint stiffness of knee, right  Chronic pain of right knee     Problem List Patient Active Problem List   Diagnosis Date Noted  . Primary osteoarthritis of right hip 12/07/2017  . Osteoarthritis of right knee 12/04/2017  . Left radial head fracture 04/04/2015   Cammie McgeeMichael C Menna Abeln, PT, DPT # [phone] 02/15/2018, 4:23 PM   [facility] [address] [place], [place], [address] Phone: [phone]   Fax:  [phone]  Name: Brenda Sellers MRN: [id number] Date of Birth: 16-Feb-1981

## 2018-02-22 ENCOUNTER — Ambulatory Visit: Payer: 59 | Attending: Orthopedic Surgery | Admitting: Physical Therapy

## 2018-02-22 DIAGNOSIS — M25661 Stiffness of right knee, not elsewhere classified: Secondary | ICD-10-CM | POA: Insufficient documentation

## 2018-02-22 DIAGNOSIS — R269 Unspecified abnormalities of gait and mobility: Secondary | ICD-10-CM | POA: Insufficient documentation

## 2018-02-22 DIAGNOSIS — M25561 Pain in right knee: Secondary | ICD-10-CM | POA: Insufficient documentation

## 2018-02-22 DIAGNOSIS — G8929 Other chronic pain: Secondary | ICD-10-CM | POA: Insufficient documentation

## 2018-02-22 DIAGNOSIS — M6281 Muscle weakness (generalized): Secondary | ICD-10-CM | POA: Insufficient documentation

## 2018-02-22 DIAGNOSIS — Z96651 Presence of right artificial knee joint: Secondary | ICD-10-CM | POA: Insufficient documentation

## 2018-02-22 NOTE — Therapy (Signed)
[place] [address] [place], [place], [address] Phone: [phone]   Fax:  [phone]  Physical Therapy Treatment  Patient Details  Name: Brenda Sellers MRN: [id number] Date of Birth: [birth year] Referring Provider: Dr. Mayer Camel   Encounter Date: 02/22/2018  PT End of Session - 02/22/18 1019    Visit Number  8    Number of Visits  15    Date for PT Re-Evaluation  03/15/18    PT Start Time  0933    PT Stop Time  1019    PT Time Calculation (min)  46 min    Activity Tolerance  Patient tolerated treatment well    Behavior During Therapy  [facility] for tasks assessed/performed       Past Medical History:  Diagnosis Date  . Anxiety   . Arthritis   . Complication of anesthesia   . GERD (gastroesophageal reflux disease)    OCC  . Headache   . Nerve pain   . Pericardial cyst   . PONV (postoperative nausea and vomiting)    Last surgery 06/2014- nausea not vomitting was pre medicated.  Marland Kitchen PSVT (paroxysmal supraventricular tachycardia) ([place])     Past Surgical History:  Procedure Laterality Date  . ABDOMINAL HYSTERECTOMY    . ANTERIOR CRUCIATE LIGAMENT REPAIR Left 1998   Menicus, PCL Screw  . CHOLECYSTECTOMY    . CHONDROPLASTY Right 04/10/2017   Procedure: CHONDROPLASTY;  Surgeon: Frederik Pear, MD;  Location: [place];  Service: Orthopedics;  Laterality: Right;  . JOINT REPLACEMENT    . KNEE ARTHROSCOPY Right    Menicus  . KNEE ARTHROSCOPY Right 04/10/2017   Procedure: ARTHROSCOPY KNEE;  Surgeon: Frederik Pear, MD;  Location: [place];  Service: Orthopedics;  Laterality: Right;  . KNEE ARTHROSCOPY WITH LATERAL MENISECTOMY Right 04/10/2017   Procedure: KNEE ARTHROSCOPY WITH  PARTIAL LATERAL MENISECTOMY;  Surgeon: Frederik Pear, MD;  Location: [place];  Service: Orthopedics;  Laterality: Right;  . LAPAROSCOPIC TOTAL HYSTERECTOMY  07/04/14  . ORIF ELBOW FRACTURE Left 04/04/2015   Procedure: OPEN REDUCTION INTERNAL FIXATION (ORIF) LEFT ELBOW/OLECRANON FRACTURE;  Surgeon: Iran Planas, MD;  Location: [place];  Service: Orthopedics;  Laterality: Left;  . TONSILLECTOMY    . TOTAL KNEE ARTHROPLASTY Right 12/07/2017   Procedure: RIGHT TOTAL KNEE ARTHROPLASTY;  Surgeon: Frederik Pear, MD;  Location: [place];  Service: Orthopedics;  Laterality: Right;    There were no vitals filed for this visit.  Subjective Assessment - 02/22/18 1018    Subjective  Pt. has returned to work [employer]) with no issues for next 2-3 weeks prior to MD f/u.  Pts. MD wanted her to come back to PT 1x for HEP.      Pertinent History  L knee past ACL reconstruction.     Limitations  House hold activities;Walking    How long can you stand comfortably?  20 minutes    How long can you walk comfortably?  20 minutes    Diagnostic tests  MRI    Patient Stated Goals  complete ROM of flex and ext, stop clicking in R LE, pain free    Currently in Pain?  No/denies        Treatment:  There.ex.:  See new HEP (handouts issued).   Scifit L7 10 min. B LE.  BOSU step ups forward/ lateral.   Walking lunges in //-bars (no UE assist). Resisted gait (Nautilus): 140# forward/ backwards/lateral 10x each.  No charge       PT Long Term Goals - 02/15/18 1513      PT LONG TERM GOAL #1   Title  Pt. independent with HEP to increase R knee AROM to WNL as compared to L knee to improve pain-free mobility.    Baseline  12/28/17: -7 to 104.  2/25: -3 to 123 deg.  3/25: 0 to 125 deg.     Time  4    Period  Weeks    Status  Achieved    Target Date  03/15/18      PT LONG TERM GOAL #2   Title  Pt. will increase LEFS to >50 out of 80 to improve pain-free mobility/ normalized gait pattern.      Baseline  LEFS: 33 out of 80 (12/28/17).  LEFS: 34 out of 80 (2/25).  3/25: 55 out of 80    Time  4    Period  Weeks    Status  Achieved    Target Date  03/15/18      PT LONG TERM GOAL #3   Title  Pt. able to ambulate  with normalized gait pattern and no assistive device on level surfaces for improved community ambulation.    Baseline  Pt. ambulates for 10 minutes with normalized gait pattern.     Time  4    Period  Weeks    Status  Achieved    Target Date  03/15/18      PT LONG TERM GOAL #4   Title  Pt. able to decrease 5 time sit to stand with no UE assist from 14.9 sec to under 12 to show increase in LE strength for safe transfers.    Baseline  on 2/4: 14.9 sec.   3/25:  6.9 sec. (no issues).    Time  4    Period  Weeks    Status  Achieved    Target Date  03/15/18      PT LONG TERM GOAL #5   Title  Pt. will be able to decrease best pain score from 5/10 with rest to 3/10 for 2 weeks with rest to improve pt. ability to sleep and "get comfortable"    Baseline  On 2/4: best 5/10 with rest.   On 2/25: 6/10.   3/25:  2-3/10 R lateral knee    Time  4    Period  Weeks    Status  Achieved    Target Date  03/15/18            Plan - 02/22/18 1020    Clinical Impression Statement  Pt. issued HEP.  No issues with R knee at this time.  All PT goals met.  Discharge from PT at this time.      Clinical Presentation  Stable    Clinical Decision Making  Low    Rehab Potential  Excellent    Clinical Impairments Affecting Rehab Potential  chronicty of Knee pain    PT Frequency  2x / week    PT Duration  4 weeks    PT Treatment/Interventions  Aquatic Therapy;Biofeedback;Cryotherapy;Occupational psychologist;Therapeutic activities;Functional mobility training;Therapeutic exercise;Balance training;Neuromuscular re-education;Patient/family education;Dry needling;Manual techniques;Compression bandaging;Iontophoresis 21m/ml Dexamethasone;Moist Heat;Traction;Ultrasound;DME Instruction;Passive range of motion;Scar mobilization;Canalith Repostioning    PT Next Visit Plan  Discharge       Patient will benefit from skilled therapeutic intervention in order to improve the following deficits  and impairments:  Abnormal gait, Pain, Decreased mobility, Decreased scar mobility, Decreased activity  tolerance, Decreased strength, Hypomobility, Difficulty walking, Decreased balance, Decreased endurance  Visit Diagnosis: Status post right knee replacement  Gait difficulty  Muscle weakness (generalized)  Joint stiffness of knee, right  Chronic pain of right knee     Problem List Patient Active Problem List   Diagnosis Date Noted  . Primary osteoarthritis of right hip 12/07/2017  . Osteoarthritis of right knee 12/04/2017  . Left radial head fracture 04/04/2015   Pura Spice, PT, DPT # [phone] 02/22/2018, 10:20 AM  [facility] [address] [place], [place], [address] Phone: [phone]   Fax:  [phone]  Name: Brenda Sellers MRN: [id number] Date of Birth: 28-Feb-1981

## 2018-02-24 DIAGNOSIS — F4310 Post-traumatic stress disorder, unspecified: Secondary | ICD-10-CM | POA: Diagnosis not present

## 2018-02-24 DIAGNOSIS — R5382 Chronic fatigue, unspecified: Secondary | ICD-10-CM | POA: Diagnosis not present

## 2018-03-29 DIAGNOSIS — F4001 Agoraphobia with panic disorder: Secondary | ICD-10-CM | POA: Diagnosis not present

## 2018-03-29 DIAGNOSIS — F4311 Post-traumatic stress disorder, acute: Secondary | ICD-10-CM | POA: Diagnosis not present

## 2018-03-29 DIAGNOSIS — F4323 Adjustment disorder with mixed anxiety and depressed mood: Secondary | ICD-10-CM | POA: Diagnosis not present

## 2018-03-29 DIAGNOSIS — F5105 Insomnia due to other mental disorder: Secondary | ICD-10-CM | POA: Diagnosis not present

## 2018-04-29 ENCOUNTER — Encounter: Payer: Self-pay | Admitting: Emergency Medicine

## 2018-04-29 ENCOUNTER — Ambulatory Visit
Admission: RE | Admit: 2018-04-29 | Discharge: 2018-04-29 | Disposition: A | Discharge status: Home / Self Care | Payer: 59 | Source: Ambulatory Visit | Attending: Family Medicine | Admitting: Family Medicine

## 2018-04-29 ENCOUNTER — Other Ambulatory Visit: Payer: Self-pay | Admitting: Family Medicine

## 2018-04-29 ENCOUNTER — Inpatient Hospital Stay
Admission: EM | Admit: 2018-04-29 | Discharge: 2018-05-06 | DRG: 392 | Disposition: A | Discharge status: Home / Self Care | Payer: 59 | Attending: Internal Medicine | Admitting: Internal Medicine

## 2018-04-29 ENCOUNTER — Other Ambulatory Visit: Payer: Self-pay

## 2018-04-29 DIAGNOSIS — F329 Major depressive disorder, single episode, unspecified: Secondary | ICD-10-CM | POA: Diagnosis not present

## 2018-04-29 DIAGNOSIS — K529 Noninfective gastroenteritis and colitis, unspecified: Secondary | ICD-10-CM | POA: Diagnosis present

## 2018-04-29 DIAGNOSIS — I471 Supraventricular tachycardia: Secondary | ICD-10-CM | POA: Diagnosis present

## 2018-04-29 DIAGNOSIS — K5732 Diverticulitis of large intestine without perforation or abscess without bleeding: Principal | ICD-10-CM | POA: Diagnosis present

## 2018-04-29 DIAGNOSIS — I959 Hypotension, unspecified: Secondary | ICD-10-CM | POA: Diagnosis present

## 2018-04-29 DIAGNOSIS — F419 Anxiety disorder, unspecified: Secondary | ICD-10-CM | POA: Diagnosis present

## 2018-04-29 DIAGNOSIS — K219 Gastro-esophageal reflux disease without esophagitis: Secondary | ICD-10-CM | POA: Diagnosis present

## 2018-04-29 DIAGNOSIS — Z6834 Body mass index (BMI) 34.0-34.9, adult: Secondary | ICD-10-CM | POA: Diagnosis not present

## 2018-04-29 DIAGNOSIS — K5792 Diverticulitis of intestine, part unspecified, without perforation or abscess without bleeding: Secondary | ICD-10-CM | POA: Diagnosis not present

## 2018-04-29 DIAGNOSIS — Z96651 Presence of right artificial knee joint: Secondary | ICD-10-CM | POA: Diagnosis present

## 2018-04-29 DIAGNOSIS — K760 Fatty (change of) liver, not elsewhere classified: Secondary | ICD-10-CM

## 2018-04-29 DIAGNOSIS — Z880 Allergy status to penicillin: Secondary | ICD-10-CM | POA: Diagnosis not present

## 2018-04-29 DIAGNOSIS — Z90710 Acquired absence of both cervix and uterus: Secondary | ICD-10-CM

## 2018-04-29 DIAGNOSIS — Z87891 Personal history of nicotine dependence: Secondary | ICD-10-CM

## 2018-04-29 DIAGNOSIS — R1032 Left lower quadrant pain: Secondary | ICD-10-CM

## 2018-04-29 DIAGNOSIS — M1990 Unspecified osteoarthritis, unspecified site: Secondary | ICD-10-CM | POA: Diagnosis present

## 2018-04-29 DIAGNOSIS — Z9049 Acquired absence of other specified parts of digestive tract: Secondary | ICD-10-CM | POA: Insufficient documentation

## 2018-04-29 DIAGNOSIS — E669 Obesity, unspecified: Secondary | ICD-10-CM | POA: Diagnosis present

## 2018-04-29 DIAGNOSIS — Z79899 Other long term (current) drug therapy: Secondary | ICD-10-CM

## 2018-04-29 DIAGNOSIS — R109 Unspecified abdominal pain: Secondary | ICD-10-CM | POA: Diagnosis not present

## 2018-04-29 DIAGNOSIS — G629 Polyneuropathy, unspecified: Secondary | ICD-10-CM | POA: Diagnosis present

## 2018-04-29 DIAGNOSIS — Z885 Allergy status to narcotic agent status: Secondary | ICD-10-CM | POA: Diagnosis not present

## 2018-04-29 DIAGNOSIS — R933 Abnormal findings on diagnostic imaging of other parts of digestive tract: Secondary | ICD-10-CM | POA: Insufficient documentation

## 2018-04-29 HISTORY — DX: Diverticulitis of intestine, part unspecified, without perforation or abscess without bleeding: K57.92

## 2018-04-29 LAB — URINALYSIS, COMPLETE (UACMP) WITH MICROSCOPIC
Bacteria, UA: NONE SEEN
Bilirubin Urine: NEGATIVE
GLUCOSE, UA: NEGATIVE mg/dL
HGB URINE DIPSTICK: NEGATIVE
KETONES UR: NEGATIVE mg/dL
LEUKOCYTES UA: NEGATIVE
Nitrite: NEGATIVE
PROTEIN: NEGATIVE mg/dL
Specific Gravity, Urine: >1.046 — ABNORMAL HIGH (ref 1.005–1.030)
pH: 5 (ref 5.0–8.0)

## 2018-04-29 LAB — COMPREHENSIVE METABOLIC PANEL
ALK PHOS: 56 U/L (ref 38–126)
ALT: 50 U/L (ref 14–54)
ANION GAP: 12 (ref 5–15)
AST: 31 U/L (ref 15–41)
Albumin: 4.9 g/dL (ref 3.5–5.0)
BILIRUBIN TOTAL: 0.8 mg/dL (ref 0.3–1.2)
BUN: 10 mg/dL (ref 6–20)
CO2: 26 mmol/L (ref 22–32)
Calcium: 9.4 mg/dL (ref 8.9–10.3)
Chloride: 99 mmol/L — ABNORMAL LOW (ref 101–111)
Creatinine, Ser: 0.79 mg/dL (ref 0.44–1.00)
GFR calc Af Amer: >60 mL/min (ref >60)
GLUCOSE: 95 mg/dL (ref 65–99)
POTASSIUM: 4.2 mmol/L (ref 3.5–5.1)
Sodium: 137 mmol/L (ref 135–145)
TOTAL PROTEIN: 8.3 g/dL — AB (ref 6.5–8.1)

## 2018-04-29 LAB — CBC WITH DIFFERENTIAL/PLATELET
BASOS ABS: 0.1 10*3/uL (ref 0–0.1)
BASOS PCT: 0 %
EOS ABS: 0.1 10*3/uL (ref 0–0.7)
Eosinophils Relative: 1 %
HCT: 41.3 % (ref 35.0–47.0)
Hemoglobin: 14.1 g/dL (ref 12.0–16.0)
Lymphocytes Relative: 24 %
Lymphs Abs: 3.6 10*3/uL (ref 1.0–3.6)
MCH: 29.6 pg (ref 26.0–34.0)
MCHC: 34.2 g/dL (ref 32.0–36.0)
MCV: 86.5 fL (ref 80.0–100.0)
MONO ABS: 1.1 10*3/uL — AB (ref 0.2–0.9)
Monocytes Relative: 8 %
NEUTROS ABS: 9.7 10*3/uL — AB (ref 1.4–6.5)
Neutrophils Relative %: 67 %
PLATELETS: 309 10*3/uL (ref 150–440)
RBC: 4.78 MIL/uL (ref 3.80–5.20)
RDW: 13.6 % (ref 11.5–14.5)
WBC: 14.6 10*3/uL — ABNORMAL HIGH (ref 3.6–11.0)

## 2018-04-29 MED ORDER — METHOCARBAMOL 750 MG PO TABS
750.0000 mg | ORAL_TABLET | Freq: Four times a day (QID) | ORAL | Status: DC | PRN
  Filled 2018-04-29: qty 1

## 2018-04-29 MED ORDER — SERTRALINE HCL 50 MG PO TABS
100.0000 mg | ORAL_TABLET | Freq: Every day | ORAL | Status: DC
  Administered 2018-04-30 – 2018-05-06 (×7): 100 mg via ORAL
  Filled 2018-04-29 (×7): qty 2

## 2018-04-29 MED ORDER — OXYCODONE-ACETAMINOPHEN 7.5-325 MG PO TABS
1.0000 | ORAL_TABLET | Freq: Four times a day (QID) | ORAL | Status: DC | PRN
  Administered 2018-04-29 – 2018-05-05 (×20): 1 via ORAL
  Filled 2018-04-29 (×20): qty 1

## 2018-04-29 MED ORDER — ACETAMINOPHEN 325 MG PO TABS: 650.0000 mg | ORAL_TABLET | Freq: Four times a day (QID) | ORAL | Status: DC | PRN

## 2018-04-29 MED ORDER — ACETAMINOPHEN 650 MG RE SUPP: 650.0000 mg | Freq: Four times a day (QID) | RECTAL | Status: DC | PRN

## 2018-04-29 MED ORDER — CIPROFLOXACIN IN D5W 400 MG/200ML IV SOLN
400.0000 mg | Freq: Once | INTRAVENOUS | Status: AC
  Administered 2018-04-29: 400 mg via INTRAVENOUS
  Filled 2018-04-29: qty 200

## 2018-04-29 MED ORDER — SODIUM CHLORIDE 0.9 % IV SOLN
1.0000 g | Freq: Three times a day (TID) | INTRAVENOUS | Status: DC
  Administered 2018-04-29 – 2018-05-02 (×10): 1 g via INTRAVENOUS
  Filled 2018-04-29 (×13): qty 1

## 2018-04-29 MED ORDER — SERTRALINE HCL 50 MG PO TABS: 100.0000 mg | ORAL_TABLET | Freq: Every day | ORAL | Status: DC

## 2018-04-29 MED ORDER — FENTANYL CITRATE (PF) 100 MCG/2ML IJ SOLN
100.0000 ug | Freq: Once | INTRAMUSCULAR | Status: AC
  Administered 2018-04-29: 100 ug via INTRAVENOUS
  Filled 2018-04-29: qty 2

## 2018-04-29 MED ORDER — METRONIDAZOLE IN NACL 5-0.79 MG/ML-% IV SOLN
500.0000 mg | Freq: Once | INTRAVENOUS | Status: AC
  Administered 2018-04-29: 500 mg via INTRAVENOUS
  Filled 2018-04-29: qty 100

## 2018-04-29 MED ORDER — HYDROMORPHONE HCL 1 MG/ML IJ SOLN
2.0000 mg | INTRAMUSCULAR | Status: AC
  Administered 2018-04-29: 2 mg via INTRAVENOUS
  Filled 2018-04-29: qty 2

## 2018-04-29 MED ORDER — DULOXETINE HCL 30 MG PO CPEP
60.0000 mg | ORAL_CAPSULE | Freq: Every day | ORAL | Status: DC
  Administered 2018-04-30 – 2018-05-06 (×7): 60 mg via ORAL
  Filled 2018-04-29 (×7): qty 2

## 2018-04-29 MED ORDER — ENOXAPARIN SODIUM 40 MG/0.4ML ~~LOC~~ SOLN
40.0000 mg | SUBCUTANEOUS | Status: DC
  Administered 2018-04-29 – 2018-05-05 (×7): 40 mg via SUBCUTANEOUS
  Filled 2018-04-29 (×7): qty 0.4

## 2018-04-29 MED ORDER — DULOXETINE HCL 30 MG PO CPEP: 60.0000 mg | ORAL_CAPSULE | Freq: Every day | ORAL | Status: DC

## 2018-04-29 MED ORDER — HYDROMORPHONE HCL 1 MG/ML IJ SOLN
1.0000 mg | Freq: Once | INTRAMUSCULAR | Status: AC
  Administered 2018-04-29: 1 mg via INTRAVENOUS
  Filled 2018-04-29: qty 1

## 2018-04-29 MED ORDER — GABAPENTIN 300 MG PO CAPS
300.0000 mg | ORAL_CAPSULE | Freq: Two times a day (BID) | ORAL | Status: DC
  Administered 2018-04-29 – 2018-05-06 (×14): 300 mg via ORAL
  Filled 2018-04-29 (×14): qty 1

## 2018-04-29 MED ORDER — TEMAZEPAM 15 MG PO CAPS
30.0000 mg | ORAL_CAPSULE | Freq: Every evening | ORAL | Status: DC | PRN
Start: 2018-04-29 — End: 2018-05-06
  Administered 2018-04-29 – 2018-05-05 (×5): 30 mg via ORAL
  Filled 2018-04-29 (×5): qty 2

## 2018-04-29 MED ORDER — SODIUM CHLORIDE 0.9 % IV SOLN
INTRAVENOUS | Status: DC
  Administered 2018-04-29 – 2018-05-04 (×6): via INTRAVENOUS

## 2018-04-29 MED ORDER — ONDANSETRON HCL 4 MG/2ML IJ SOLN
4.0000 mg | Freq: Once | INTRAMUSCULAR | Status: AC
  Administered 2018-04-29: 4 mg via INTRAVENOUS
  Filled 2018-04-29: qty 2

## 2018-04-29 MED ORDER — IOPAMIDOL (ISOVUE-300) INJECTION 61%
100.0000 mL | Freq: Once | INTRAVENOUS | Status: AC | PRN
  Administered 2018-04-29: 100 mL via INTRAVENOUS

## 2018-04-29 MED ORDER — SODIUM CHLORIDE 0.9 % IV BOLUS
1000.0000 mL | Freq: Once | INTRAVENOUS | Status: AC
  Administered 2018-04-29: 1000 mL via INTRAVENOUS

## 2018-04-29 MED ORDER — PROMETHAZINE HCL 25 MG/ML IJ SOLN
25.0000 mg | Freq: Four times a day (QID) | INTRAMUSCULAR | Status: DC | PRN
  Administered 2018-04-29 – 2018-05-03 (×5): 25 mg via INTRAVENOUS
  Filled 2018-04-29 (×5): qty 1

## 2018-04-29 MED ORDER — ALPRAZOLAM 0.5 MG PO TABS
0.5000 mg | ORAL_TABLET | Freq: Two times a day (BID) | ORAL | Status: DC | PRN
  Administered 2018-04-29 – 2018-05-04 (×7): 0.5 mg via ORAL
  Filled 2018-04-29 (×7): qty 1

## 2018-04-29 MED ORDER — HYDROMORPHONE HCL 1 MG/ML IJ SOLN
2.0000 mg | INTRAMUSCULAR | Status: DC | PRN
  Administered 2018-05-01 – 2018-05-04 (×16): 2 mg via INTRAVENOUS
  Filled 2018-04-29 (×16): qty 2

## 2018-04-29 NOTE — H&P (Signed)
[facility] [place] [facility]   PATIENT NAME: Brenda PellegriniStacy Sellers    MR#:  [id number]  DATE OF BIRTH:  01/01/1981  DATE OF ADMISSION:  04/29/2018  PRIMARY CARE PHYSICIAN: Danella PentonMiller, Mark F, MD   REQUESTING/REFERRING PHYSICIAN: Dr. Ileana RoupJames McShane  CHIEF COMPLAINT:   Chief Complaint  Patient presents with  . Abdominal Pain    HISTORY OF PRESENT ILLNESS:  Brenda PellegriniStacy Loflin  is a 37 y.o. female with a known history of anxiety, arthritis, GERD, recent right total knee replacement due to trauma at work presents to hospital secondary to intense lower abdominal pain associated with nausea and vomiting for 2 days. Patient ate at a [employer] restaurant 3 days ago.  None of her other family members got sick.  She woke up with left lower quadrant abdominal pain.  Denies any fevers or chills.  Complains of nausea and vomiting.  Pain has been so intense that she went to see her physician, got a CT of her abdomen that showed acute sigmoid diverticulitis.  Patient received fentanyl and Dilaudid in the emergency room still with significant lower abdominal pain and nausea.  She was unable to keep her oral contrast in.  She is being admitted for acute diverticulitis.  PAST MEDICAL HISTORY:   Past Medical History:  Diagnosis Date  . Anxiety   . Arthritis   . Complication of anesthesia   . GERD (gastroesophageal reflux disease)    OCC  . Headache   . Nerve pain   . Pericardial cyst   . PONV (postoperative nausea and vomiting)    Last surgery 06/2014- nausea not vomitting was pre medicated.  Marland Kitchen. PSVT (paroxysmal supraventricular tachycardia) (HCC)     PAST SURGICAL HISTORY:   Past Surgical History:  Procedure Laterality Date  . ABDOMINAL HYSTERECTOMY    . ANTERIOR CRUCIATE LIGAMENT REPAIR Left 1998   Menicus, PCL Screw  . CHOLECYSTECTOMY    . CHONDROPLASTY Right 04/10/2017   Procedure: CHONDROPLASTY;  Surgeon: Gean Birchwoodowan, Frank, MD;  Location: [facility];  Service:  Orthopedics;  Laterality: Right;  . JOINT REPLACEMENT    . KNEE ARTHROSCOPY Right    Menicus  . KNEE ARTHROSCOPY Right 04/10/2017   Procedure: ARTHROSCOPY KNEE;  Surgeon: Gean Birchwoodowan, Frank, MD;  Location: [facility];  Service: Orthopedics;  Laterality: Right;  . KNEE ARTHROSCOPY WITH LATERAL MENISECTOMY Right 04/10/2017   Procedure: KNEE ARTHROSCOPY WITH  PARTIAL LATERAL MENISECTOMY;  Surgeon: Gean Birchwoodowan, Frank, MD;  Location:  SURGERY CENTER;  Service: Orthopedics;  Laterality: Right;  . LAPAROSCOPIC TOTAL HYSTERECTOMY  07/04/14  . ORIF ELBOW FRACTURE Left 04/04/2015   Procedure: OPEN REDUCTION INTERNAL FIXATION (ORIF) LEFT ELBOW/OLECRANON FRACTURE;  Surgeon: Bradly BienenstockFred Ortmann, MD;  Location: [facility];  Service: Orthopedics;  Laterality: Left;  . right total knee replacement    . TONSILLECTOMY    . TOTAL KNEE ARTHROPLASTY Right 12/07/2017   Procedure: RIGHT TOTAL KNEE ARTHROPLASTY;  Surgeon: Gean Birchwoodowan, Frank, MD;  Location: [facility];  Service: Orthopedics;  Laterality: Right;    SOCIAL HISTORY:   Social History   Tobacco Use  . Smoking status: Former Smoker    Years: 10.00  . Smokeless tobacco: Never Used  . Tobacco comment: Quit 2006  Substance Use Topics  . Alcohol use: Yes    Comment: OCC    FAMILY HISTORY:   Family History  Problem Relation Age of Onset  . Ovarian cancer Mother   . Esophageal cancer Father     DRUG ALLERGIES:  Allergies  Allergen Reactions  . Augmentin [Amoxicillin-Pot Clavulanate] Rash and Other (See Comments)    Can take any other PCNs Has patient had a PCN reaction causing immediate rash, facial/tongue/throat swelling, SOB or lightheadedness with hypotension: Yes Has patient had a PCN reaction causing severe rash involving mucus membranes or skin necrosis: No Has patient had a PCN reaction that required hospitalization: No Has patient had a PCN reaction occurring within the last 10 years: No If all of the above answers are "NO", then may proceed  with Cephalosporin use.   . Vicodin [Hydrocodone-Acetaminophen] Nausea And Vomiting, Rash and Nausea Only  . Codeine Sulfate Nausea And Vomiting  . Hydrocodone-Acetaminophen Nausea Only  . Hydrocodone-Homatropine Other (See Comments)    REVIEW OF SYSTEMS:   Review of Systems  Constitutional: Negative for chills, fever, malaise/fatigue and weight loss.  HENT: Negative for ear discharge, ear pain, hearing loss and nosebleeds.   Eyes: Negative for blurred vision, double vision and photophobia.  Respiratory: Negative for cough, hemoptysis, shortness of breath and wheezing.   Cardiovascular: Negative for chest pain, palpitations, orthopnea and leg swelling.  Gastrointestinal: Positive for abdominal pain, nausea and vomiting. Negative for constipation, diarrhea, heartburn and melena.  Genitourinary: Negative for dysuria, frequency, hematuria and urgency.  Musculoskeletal: Negative for back pain, myalgias and neck pain.  Skin: Negative for rash.  Neurological: Negative for dizziness, tremors, sensory change, speech change, focal weakness and headaches.  Endo/Heme/Allergies: Does not bruise/bleed easily.  Psychiatric/Behavioral: Negative for depression.    MEDICATIONS AT HOME:   Prior to Admission medications   Medication Sig Start Date End Date Taking? Authorizing Provider  ALPRAZolam Prudy Feeler) 0.5 MG tablet Take 0.5 mg by mouth 2 (two) times daily as needed for anxiety.    Yes [provider]  DULoxetine (CYMBALTA) 60 MG capsule Take 60 mg by mouth daily.    Yes [provider]  gabapentin (NEURONTIN) 300 MG capsule Take 300 mg by mouth 2 (two) times daily.   Yes [provider]  methocarbamol (ROBAXIN) 750 MG tablet Take 1 tablet (750 mg total) by mouth every 6 (six) hours as needed for muscle spasms. 12/07/17  Yes Dannielle Burn K, PA-C  sertraline (ZOLOFT) 100 MG tablet Take 100 mg by mouth daily.   Yes [provider]  temazepam (RESTORIL) 30 MG  capsule Take 30 mg by mouth at bedtime as needed for sleep.    Yes [provider]  traMADol (ULTRAM) 50 MG tablet Take 50 mg by mouth See admin instructions. Take 50 mg by mouth up to 5 times daily as needed for pain   Yes [provider]  aspirin EC 325 MG tablet Take 1 tablet (325 mg total) by mouth 2 (two) times daily. Patient not taking: Reported on 04/29/2018 12/07/17   Allena Katz, PA-C  celecoxib (CELEBREX) 200 MG capsule Take 1 capsule (200 mg total) by mouth 2 (two) times daily. Patient not taking: Reported on 04/29/2018 12/09/17   Allena Katz, PA-C  oxyCODONE-acetaminophen (ROXICET) 5-325 MG tablet Take 1 tablet by mouth every 4 (four) hours as needed. Patient not taking: Reported on 04/29/2018 12/07/17   Allena Katz, PA-C      VITAL SIGNS:  Blood pressure 128/85, pulse 94, temperature 98.7 F (37.1 C), temperature source Oral, resp. rate 20, height 5\' 8"  (1.727 m), weight 103.4 kg (228 lb), SpO2 99 %.  PHYSICAL EXAMINATION:   Physical Exam  GENERAL:  37 y.o.-year-old patient lying in the bed, almost tearful due  to her pain.  EYES: Pupils equal, round, reactive to light and accommodation. No scleral icterus. Extraocular muscles intact.  HEENT: Head atraumatic, normocephalic. Oropharynx and nasopharynx clear.  NECK:  Supple, no jugular venous distention. No thyroid enlargement, no tenderness.  LUNGS: Normal breath sounds bilaterally, no wheezing, rales,rhonchi or crepitation. No use of accessory muscles of respiration.  CARDIOVASCULAR: S1, S2 normal. No murmurs, rubs, or gallops.  ABDOMEN: Soft, extremely tender in left lower quadrant, no guarding or rigidity, nondistended. Bowel sounds present. No organomegaly or mass.  EXTREMITIES: No pedal edema, cyanosis, or clubbing.  NEUROLOGIC: Cranial nerves II through XII are intact. Muscle strength 5/5 in all extremities. Sensation intact. Gait not checked.  PSYCHIATRIC: The patient is alert and oriented x 3.    SKIN: No obvious rash, lesion, or ulcer.   LABORATORY PANEL:   CBC Recent Labs  Lab 04/29/18 1939  WBC 14.6*  HGB 14.1  HCT 41.3  PLT 309   ------------------------------------------------------------------------------------------------------------------  Chemistries  Recent Labs  Lab 04/29/18 1939  NA 137  K 4.2  CL 99*  CO2 26  GLUCOSE 95  BUN 10  CREATININE 0.79  CALCIUM 9.4  AST 31  ALT 50  ALKPHOS 56  BILITOT 0.8   ------------------------------------------------------------------------------------------------------------------  Cardiac Enzymes No results for input(s): TROPONINI in the last 168 hours. ------------------------------------------------------------------------------------------------------------------  RADIOLOGY:  Ct Abdomen Pelvis W Contrast  Result Date: 04/29/2018 CLINICAL DATA:  Initial evaluation for acute left lower quadrant pain. EXAM: CT ABDOMEN AND PELVIS WITH CONTRAST TECHNIQUE: Multidetector CT imaging of the abdomen and pelvis was performed using the standard protocol following bolus administration of intravenous contrast. CONTRAST:  ISOVUE-300 IOPAMIDOL (ISOVUE-300) INJECTION 61% COMPARISON:  Prior CT from 02/01/2016. FINDINGS: Lower chest: Visualized lung bases are clear. Hepatobiliary: Mild diffuse hypoattenuation liver suggestive of steatosis. 2.8 cm cyst present within the left hepatic lobe. Liver otherwise unremarkable. Gallbladder surgically absent. No biliary dilatation. Pancreas: Pancreas within normal limits. Spleen: Spleen within normal limits. Adrenals/Urinary Tract: Adrenal glands are normal. Kidneys equal size with symmetric enhancement. No nephrolithiasis, hydronephrosis, or focal enhancing renal mass. No hydroureter. Partially distended bladder within normal limits. Stomach/Bowel: Stomach within normal limits. No evidence for bowel obstruction. Normal appendix. Inflammatory stranding with wall thickening about several  diverticula at the sigmoid colon in the left lower quadrant, consistent with acute diverticulitis (series 2, image 78). No evidence for perforation or other complication. No other acute inflammatory changes about the bowels. Vascular/Lymphatic: Normal intravascular enhancement seen throughout the intra-abdominal aorta and its branch vessels. No adenopathy. Reproductive: Uterus is surgically absent. Tiny subcentimeter cysts with calcification noted within the left ovary. Right ovary not discretely identified. Other: No free intraperitoneal air. Small volume free fluid within the pelvis, which may be physiologic and/or reactive. Musculoskeletal: No acute osseous abnormality. No worrisome lytic or blastic osseous lesions. IMPRESSION: 1. Findings consistent with acute sigmoid diverticulitis. No evidence for perforation or other complication. 2. Mild hepatic steatosis. 3. Prior cholecystectomy and hysterectomy. Electronically Signed   By: Rise Mu M.D.   On: 04/29/2018 18:05    EKG:   Orders placed or performed during the hospital encounter of 11/26/17  . EKG 12-Lead  . EKG 12-Lead    IMPRESSION AND PLAN:   Brenda Sellers  is a 37 y.o. female with a known history of anxiety, arthritis, GERD, recent right total knee replacement due to trauma at work presents to hospital secondary to intense lower abdominal pain associated with nausea and vomiting for 2 days.  1.  Acute  sigmoid diverticulitis-no diarrhea currently.  Order stool studies if still having any diarrhea. -IV fluids, pain medicine and nausea medicine. -N.p.o. and advance diet as tolerated -CT of the abdomen showing sigmoid diverticulitis but no abscess or perforation.  But her pain is very severe and disproportionate to her findings. -We will get a surgical consult to rule out any early perforation.  No guarding or rigidity on exam. -IV meropenem  2.  Neuropathy-continue gabapentin.  Also Robaxin for muscle spasms  3.   Depression-on Celexa, Xanax and Zoloft  4.  DVT prophylaxis-Lovenox    All the records are reviewed and case discussed with ED provider. Management plans discussed with the patient, family and they are in agreement.  CODE STATUS: Full Code  TOTAL TIME TAKING CARE OF THIS PATIENT: 50 minutes.    Enid Baas M.D on 04/29/2018 at 9:29 PM  Between 7am to 6pm - Pager - [phone]  After 6pm go to [email or web address] - Social research officer, government  [facility]  Office  [phone]  CC: Primary care physician; Danella Penton, MD

## 2018-04-29 NOTE — Progress Notes (Signed)
ANTIBIOTIC CONSULT NOTE - INITIAL  Pharmacy Consult for Meropenem  Indication: intra abdominal infection  Allergies  Allergen Reactions  . Augmentin [Amoxicillin-Pot Clavulanate] Rash and Other (See Comments)    Can take any other PCNs Has patient had a PCN reaction causing immediate rash, facial/tongue/throat swelling, SOB or lightheadedness with hypotension: Yes Has patient had a PCN reaction causing severe rash involving mucus membranes or skin necrosis: No Has patient had a PCN reaction that required hospitalization: No Has patient had a PCN reaction occurring within the last 10 years: No If all of the above answers are "NO", then may proceed with Cephalosporin use.   . Vicodin [Hydrocodone-Acetaminophen] Nausea And Vomiting, Rash and Nausea Only  . Codeine Sulfate Nausea And Vomiting  . Hydrocodone-Acetaminophen Nausea Only  . Hydrocodone-Homatropine Other (See Comments)    Patient Measurements: Height: 5\' 8"  (172.7 cm) Weight: 228 lb (103.4 kg) IBW/kg (Calculated) : 63.9 Adjusted Body Weight:   Vital Signs: Temp: 98.7 F (37.1 C) (06/06 1910) Temp Source: Oral (06/06 1910) BP: 136/63 (06/06 2000) Pulse Rate: 92 (06/06 2000) Intake/Output from previous day: No intake/output data recorded. Intake/Output from this shift: No intake/output data recorded.  Labs: Recent Labs    04/29/18 1939  WBC 14.6*  HGB 14.1  PLT 309  CREATININE 0.79   Estimated Creatinine Clearance: 120 mL/min (by C-G formula based on SCr of 0.79 mg/dL). No results for input(s): VANCOTROUGH, VANCOPEAK, VANCORANDOM, GENTTROUGH, GENTPEAK, GENTRANDOM, TOBRATROUGH, TOBRAPEAK, TOBRARND, AMIKACINPEAK, AMIKACINTROU, AMIKACIN in the last 72 hours.   Microbiology: No results found for this or any previous visit (from the past 720 hour(s)).  Medical History: Past Medical History:  Diagnosis Date  . Anxiety   . Arthritis   . Complication of anesthesia   . GERD (gastroesophageal reflux disease)    OCC  . Headache   . Nerve pain   . Pericardial cyst   . PONV (postoperative nausea and vomiting)    Last surgery 06/2014- nausea not vomitting was pre medicated.  Marland Kitchen. PSVT (paroxysmal supraventricular tachycardia) (HCC)     Medications:   (Not in a hospital admission) Assessment: CrCl = 120 ml/min   Goal of Therapy:  resolution of infection  Plan:  Expected duration 7 days with resolution of temperature and/or normalization of WBC   Will start Meropenem 1 gm IV Q8H on 6/6 @ ~ 21:30.   Deshara Rossi D 04/29/2018,9:08 PM

## 2018-04-29 NOTE — ED Provider Notes (Signed)
[facility] Emergency Department Provider Note  ____________________________________________   I have reviewed the triage vital signs and the nursing notes. Where available I have reviewed prior notes and, if possible and indicated, outside hospital notes.    HISTORY  Chief Complaint Abdominal Pain    HPI Brenda Sellers is a 37 y.o. female  Presents today complaining of abdominal pain.  Patient has had abdominal pain gradually onset since yesterday, so she was vomiting and nonbloody diarrhea, she went to her primary care doctor, they did a CT scan which showed an comp gated diverticulitis in the left side with no perforation or abscess.  Patient states the pain is worse than labor, she states the cramping discomfort in the left side no radiation nothing is better nothing makes it worse, she states that she has had GI upset with narcotics but no true allergy.  She denies any fever or chills.      Past Medical History:  Diagnosis Date  . Anxiety   . Arthritis   . Complication of anesthesia   . GERD (gastroesophageal reflux disease)    OCC  . Headache   . Nerve pain   . Pericardial cyst   . PONV (postoperative nausea and vomiting)    Last surgery 06/2014- nausea not vomitting was pre medicated.  Marland Kitchen. PSVT (paroxysmal supraventricular tachycardia) [facility](HCC)     Patient Active Problem List   Diagnosis Date Noted  . Primary osteoarthritis of right hip 12/07/2017  . Osteoarthritis of right knee 12/04/2017  . Left radial head fracture 04/04/2015    Past Surgical History:  Procedure Laterality Date  . ABDOMINAL HYSTERECTOMY    . ANTERIOR CRUCIATE LIGAMENT REPAIR Left 1998   Menicus, PCL Screw  . CHOLECYSTECTOMY    . CHONDROPLASTY Right 04/10/2017   Procedure: CHONDROPLASTY;  Surgeon: Gean Birchwoodowan, Frank, MD;  Location: [facility];  Service: Orthopedics;  Laterality: Right;  . JOINT REPLACEMENT    . KNEE ARTHROSCOPY Right    Menicus  . KNEE  ARTHROSCOPY Right 04/10/2017   Procedure: ARTHROSCOPY KNEE;  Surgeon: Gean Birchwoodowan, Frank, MD;  Location: [facility];  Service: Orthopedics;  Laterality: Right;  . KNEE ARTHROSCOPY WITH LATERAL MENISECTOMY Right 04/10/2017   Procedure: KNEE ARTHROSCOPY WITH  PARTIAL LATERAL MENISECTOMY;  Surgeon: Gean Birchwoodowan, Frank, MD;  Location: [facility];  Service: Orthopedics;  Laterality: Right;  . LAPAROSCOPIC TOTAL HYSTERECTOMY  07/04/14  . ORIF ELBOW FRACTURE Left 04/04/2015   Procedure: OPEN REDUCTION INTERNAL FIXATION (ORIF) LEFT ELBOW/OLECRANON FRACTURE;  Surgeon: Bradly BienenstockFred Ortmann, MD;  Location: [facility] OR;  Service: Orthopedics;  Laterality: Left;  . TONSILLECTOMY    . TOTAL KNEE ARTHROPLASTY Right 12/07/2017   Procedure: RIGHT TOTAL KNEE ARTHROPLASTY;  Surgeon: Gean Birchwoodowan, Frank, MD;  Location: [facility] OR;  Service: Orthopedics;  Laterality: Right;    Prior to Admission medications   Medication Sig Start Date End Date Taking? Authorizing Provider  albuterol (PROVENTIL HFA;VENTOLIN HFA) 108 (90 Base) MCG/ACT inhaler Inhale 2 puffs into the lungs every 6 (six) hours as needed for wheezing or shortness of breath. Patient not taking: Reported on 11/25/2017 07/29/17   Willy Eddyobinson, Patrick, MD  ALPRAZolam Prudy Feeler(XANAX) 0.5 MG tablet Take 0.5 mg by mouth 2 (two) times daily as needed for anxiety.     [provider]  aspirin EC 325 MG tablet Take 1 tablet (325 mg total) by mouth 2 (two) times daily. 12/07/17   Allena KatzPhillips, Eric K, PA-C  buPROPion (WELLBUTRIN XL) 150 MG 24 hr tablet  Take 150 mg by mouth daily.    [provider]  celecoxib (CELEBREX) 200 MG capsule Take 1 capsule (200 mg total) by mouth 2 (two) times daily. 12/09/17   Allena Katz, PA-C  chlorpheniramine-HYDROcodone (TUSSIONEX PENNKINETIC ER) 10-8 MG/5ML SUER Take 5 mLs by mouth at bedtime as needed for cough. Patient not taking: Reported on 11/25/2017 07/29/17   Willy Eddy, MD  diazepam (VALIUM) 5 MG tablet Take 5 mg by mouth at bedtime  as needed for muscle spasms.     [provider]  DULoxetine (CYMBALTA) 60 MG capsule Take 60 mg by mouth daily.     [provider]  gabapentin (NEURONTIN) 300 MG capsule Take 300 mg by mouth 2 (two) times daily.    [provider]  methocarbamol (ROBAXIN) 750 MG tablet Take 1 tablet (750 mg total) by mouth every 6 (six) hours as needed for muscle spasms. 12/07/17   Allena Katz, PA-C  Multiple Vitamins-Minerals (MULTIVITAMIN PO) Take 1 packet by mouth daily. Essential Oil Supplement Packet - 8 tablets total    [provider]  oxyCODONE-acetaminophen (ROXICET) 5-325 MG tablet Take 1 tablet by mouth every 4 (four) hours as needed. 12/07/17   Allena Katz, PA-C  predniSONE (DELTASONE) 10 MG tablet Take 1 tablet (10 mg total) by mouth daily. Day 1-2: Take 50 mg  ( 5 pills) Day 3-4 : Take 40 mg (4pills) Day 5-6: Take 30 mg (3 pills) Day 7-8:  Take 20 mg (2 pills) Day 9:  Take 10mg  (1 pill) Patient not taking: Reported on 11/25/2017 07/29/17   Willy Eddy, MD  prochlorperazine (COMPAZINE) 10 MG tablet Take 1 tablet (10 mg total) by mouth every 6 (six) hours as needed for nausea. Patient not taking: Reported on 11/25/2017 01/29/17 01/29/18  Jeanmarie Plant, MD  promethazine (PHENERGAN) 12.5 MG tablet Take 1 tablet (12.5 mg total) by mouth every 6 (six) hours as needed for nausea or vomiting. Patient not taking: Reported on 11/25/2017 07/29/17   Willy Eddy, MD  traMADol (ULTRAM) 50 MG tablet Take 50 mg by mouth See admin instructions. Take 50 mg by mouth up to 5 times daily as needed for pain    [provider]    Allergies Augmentin [amoxicillin-pot clavulanate]; Vicodin [hydrocodone-acetaminophen]; Codeine sulfate; Hydrocodone-acetaminophen; Hydrocodone-homatropine; and Penicillin g  No family history on file.  Social History Social History   Tobacco Use  . Smoking status: Former Smoker    Years: 10.00  . Smokeless tobacco: Never Used  .  Tobacco comment: Quit 2006  Substance Use Topics  . Alcohol use: Yes    Comment: OCC  . Drug use: No    Review of Systems Constitutional: No fever/chills Eyes: No visual changes. ENT: No sore throat. No stiff neck no neck pain Cardiovascular: Denies chest pain. Respiratory: Denies shortness of breath. Gastrointestinal: See HPI Genitourinary: Negative for dysuria. Musculoskeletal: Negative lower extremity swelling Skin: Negative for rash. Neurological: Negative for severe headaches, focal weakness or numbness.   ____________________________________________   PHYSICAL EXAM:  VITAL SIGNS: ED Triage Vitals [04/29/18 1910]  Enc Vitals Group     BP (!) 157/119     Pulse Rate 95     Resp 20     Temp 98.7 F (37.1 C)     Temp Source Oral     SpO2 100 %     Weight 228 lb (103.4 kg)     Height 5\' 8"  (1.727 m)     Head Circumference  Peak Flow      Pain Score 8     Pain Loc      Pain Edu?      Excl. in GC?     Constitutional: Alert and oriented patient appears very uncomfortable but nontoxic medically  eyes: Conjunctivae are normal Head: Atraumatic HEENT: No congestion/rhinnorhea. Mucous membranes are moist.  Oropharynx non-erythematous Neck:   Nontender with no meningismus, no masses, no stridor Cardiovascular: Normal rate, regular rhythm. Grossly normal heart sounds.  Good peripheral circulation. Respiratory: Normal respiratory effort.  No retractions. Lungs CTAB. Abdominal: Soft and lower quadrant tenderness with no guarding or rebound noted also noted. No distention. No guarding no rebound Back:  There is no focal tenderness or step off.  there is no midline tenderness there are no lesions noted. there is no CVA tenderness Musculoskeletal: No lower extremity tenderness, no upper extremity tenderness. No joint effusions, no DVT signs strong distal pulses no edema Neurologic:  Normal speech and language. No gross focal neurologic deficits are appreciated.  Skin:   Skin is warm, dry and intact. No rash noted. Psychiatric: Mood and affect are normal. Speech and behavior are normal.  ____________________________________________   LABS (all labs ordered are listed, but only abnormal results are displayed)  Labs Reviewed  URINALYSIS, COMPLETE (UACMP) WITH MICROSCOPIC  CBC WITH DIFFERENTIAL/PLATELET  COMPREHENSIVE METABOLIC PANEL    Pertinent labs  results that were available during my care of the patient were reviewed by me and considered in my medical decision making (see chart for details). ____________________________________________  EKG  I personally interpreted any EKGs ordered by me or triage  ____________________________________________  RADIOLOGY  Pertinent labs & imaging results that were available during my care of the patient were reviewed by me and considered in my medical decision making (see chart for details). If possible, patient and/or family made aware of any abnormal findings.  Ct Abdomen Pelvis W Contrast  Result Date: 04/29/2018 CLINICAL DATA:  Initial evaluation for acute left lower quadrant pain. EXAM: CT ABDOMEN AND PELVIS WITH CONTRAST TECHNIQUE: Multidetector CT imaging of the abdomen and pelvis was performed using the standard protocol following bolus administration of intravenous contrast. CONTRAST:  ISOVUE-300 IOPAMIDOL (ISOVUE-300) INJECTION 61% COMPARISON:  Prior CT from 02/01/2016. FINDINGS: Lower chest: Visualized lung bases are clear. Hepatobiliary: Mild diffuse hypoattenuation liver suggestive of steatosis. 2.8 cm cyst present within the left hepatic lobe. Liver otherwise unremarkable. Gallbladder surgically absent. No biliary dilatation. Pancreas: Pancreas within normal limits. Spleen: Spleen within normal limits. Adrenals/Urinary Tract: Adrenal glands are normal. Kidneys equal size with symmetric enhancement. No nephrolithiasis, hydronephrosis, or focal enhancing renal mass. No hydroureter. Partially distended  bladder within normal limits. Stomach/Bowel: Stomach within normal limits. No evidence for bowel obstruction. Normal appendix. Inflammatory stranding with wall thickening about several diverticula at the sigmoid colon in the left lower quadrant, consistent with acute diverticulitis (series 2, image 78). No evidence for perforation or other complication. No other acute inflammatory changes about the bowels. Vascular/Lymphatic: Normal intravascular enhancement seen throughout the intra-abdominal aorta and its branch vessels. No adenopathy. Reproductive: Uterus is surgically absent. Tiny subcentimeter cysts with calcification noted within the left ovary. Right ovary not discretely identified. Other: No free intraperitoneal air. Small volume free fluid within the pelvis, which may be physiologic and/or reactive. Musculoskeletal: No acute osseous abnormality. No worrisome lytic or blastic osseous lesions. IMPRESSION: 1. Findings consistent with acute sigmoid diverticulitis. No evidence for perforation or other complication. 2. Mild hepatic steatosis. 3. Prior cholecystectomy and hysterectomy. Electronically  Signed   By: Rise Mu M.D.   On: 04/29/2018 18:05   ____________________________________________    PROCEDURES  Procedure(s) performed: None  Procedures  Critical Care performed: None  ____________________________________________   INITIAL IMPRESSION / ASSESSMENT AND PLAN / ED COURSE  Pertinent labs & imaging results that were available during my care of the patient were reviewed by me and considered in my medical decision making (see chart for details).  Here with diverticulitis significant discomfort, we will check basic blood work as a precaution and a baseline in case his bleeding but there has been no report of bleeding thus far.  We will give her pain medication, IV fluids, will start with fentanyl because of various intolerances to narcotics patient weighs 100 kg we will give  100 mics of fentanyl.  No acute medical distress but she is very very uncomfortable.  Is my hope that we can get her feeling better.  Abdomen is nonsurgical.  We will start her on Cipro and Flagyl given the penicillin allergy.  Patient is not pregnant, she has had a hysterectomy.    ____________________________________________   FINAL CLINICAL IMPRESSION(S) / ED DIAGNOSES  Final diagnoses:  None      This chart was dictated using voice recognition software.  Despite best efforts to proofread,  errors can occur which can change meaning.      Jeanmarie Plant, MD 04/29/18 [phone]

## 2018-04-29 NOTE — ED Triage Notes (Signed)
Patient sent by PCP after CT scan showing diverticulitis. Patient denies history of same. Abdominal pain with nausea and diarrhea since Wednesday. Also reports urgency with urination, with small amounts of output.

## 2018-04-29 NOTE — ED Notes (Signed)
Transport to room 211.AS

## 2018-04-30 DIAGNOSIS — K5792 Diverticulitis of intestine, part unspecified, without perforation or abscess without bleeding: Secondary | ICD-10-CM

## 2018-04-30 LAB — CBC
HCT: 34.4 % — ABNORMAL LOW (ref 35.0–47.0)
HEMOGLOBIN: 11.8 g/dL — AB (ref 12.0–16.0)
MCH: 29.6 pg (ref 26.0–34.0)
MCHC: 34.2 g/dL (ref 32.0–36.0)
MCV: 86.5 fL (ref 80.0–100.0)
Platelets: 236 10*3/uL (ref 150–440)
RBC: 3.98 MIL/uL (ref 3.80–5.20)
RDW: 13.4 % (ref 11.5–14.5)
WBC: 7.3 10*3/uL (ref 3.6–11.0)

## 2018-04-30 LAB — BASIC METABOLIC PANEL
ANION GAP: 8 (ref 5–15)
BUN: 9 mg/dL (ref 6–20)
CALCIUM: 8.2 mg/dL — AB (ref 8.9–10.3)
CO2: 26 mmol/L (ref 22–32)
Chloride: 104 mmol/L (ref 101–111)
Creatinine, Ser: 0.74 mg/dL (ref 0.44–1.00)
GFR calc non Af Amer: >60 mL/min (ref >60)
Glucose, Bld: 93 mg/dL (ref 65–99)
Potassium: 3.9 mmol/L (ref 3.5–5.1)
Sodium: 138 mmol/L (ref 135–145)

## 2018-04-30 NOTE — Progress Notes (Signed)
[employer] - [place] at [facility]   PATIENT NAME: Brenda Sellers    MR#:  [id number]  DATE OF BIRTH:  04/07/1981  SUBJECTIVE:   Pt. Here due to abdominal pain and noted to have acute diverticulitis.  Still having significant LLQ abdominal pain. No N/V or fever.   REVIEW OF SYSTEMS:    Review of Systems  Constitutional: Negative for chills and fever.  HENT: Negative for congestion and tinnitus.   Eyes: Negative for blurred vision and double vision.  Respiratory: Negative for cough, shortness of breath and wheezing.   Cardiovascular: Negative for chest pain, orthopnea and PND.  Gastrointestinal: Positive for abdominal pain (LLQ). Negative for diarrhea, nausea and vomiting.  Genitourinary: Negative for dysuria and hematuria.  Neurological: Negative for dizziness, sensory change and focal weakness.  All other systems reviewed and are negative.   Nutrition: NPO Tolerating Diet: NO Tolerating PT: Await Eval.    DRUG ALLERGIES:   Allergies  Allergen Reactions  . Augmentin [Amoxicillin-Pot Clavulanate] Rash and Other (See Comments)    Can take any other PCNs Has patient had a PCN reaction causing immediate rash, facial/tongue/throat swelling, SOB or lightheadedness with hypotension: Yes Has patient had a PCN reaction causing severe rash involving mucus membranes or skin necrosis: No Has patient had a PCN reaction that required hospitalization: No Has patient had a PCN reaction occurring within the last 10 years: No If all of the above answers are "NO", then may proceed with Cephalosporin use.   . Vicodin [Hydrocodone-Acetaminophen] Nausea And Vomiting, Rash and Nausea Only  . Codeine Sulfate Nausea And Vomiting  . Hydrocodone-Acetaminophen Nausea Only  . Hydrocodone-Homatropine Other (See Comments)    VITALS:  Blood pressure 113/75, pulse 81, temperature (!) 97.4 F (36.3 C), temperature source Oral, resp. rate 20, height 5\' 8"  (1.727 m), weight 103.4 kg  (227 lb 15.3 oz), SpO2 100 %.  PHYSICAL EXAMINATION:   Physical Exam  GENERAL:  37 y.o.-year-old patient lying in bed in no acute distress.  EYES: Pupils equal, round, reactive to light and accommodation. No scleral icterus. Extraocular muscles intact.  HEENT: Head atraumatic, normocephalic. Oropharynx and nasopharynx clear.  NECK:  Supple, no jugular venous distention. No thyroid enlargement, no tenderness.  LUNGS: Normal breath sounds bilaterally, no wheezing, rales, rhonchi. No use of accessory muscles of respiration.  CARDIOVASCULAR: S1, S2 normal. No murmurs, rubs, or gallops.  ABDOMEN: Soft, Tender in LLQ, No rebound, rigidity, nondistended. Bowel sounds present. No organomegaly or mass.  EXTREMITIES: No cyanosis, clubbing or edema b/l.    NEUROLOGIC: Cranial nerves II through XII are intact. No focal Motor or sensory deficits b/l.   PSYCHIATRIC: The patient is alert and oriented x 3.  SKIN: No obvious rash, lesion, or ulcer.    LABORATORY PANEL:   CBC Recent Labs  Lab 04/30/18 0502  WBC 7.3  HGB 11.8*  HCT 34.4*  PLT 236   ------------------------------------------------------------------------------------------------------------------  Chemistries  Recent Labs  Lab 04/29/18 1939 04/30/18 0502  NA 137 138  K 4.2 3.9  CL 99* 104  CO2 26 26  GLUCOSE 95 93  BUN 10 9  CREATININE 0.79 0.74  CALCIUM 9.4 8.2*  AST 31  --   ALT 50  --   ALKPHOS 56  --   BILITOT 0.8  --    ------------------------------------------------------------------------------------------------------------------  Cardiac Enzymes No results for input(s): TROPONINI in the last 168 hours. ------------------------------------------------------------------------------------------------------------------  RADIOLOGY:  Ct Abdomen Pelvis W Contrast  Result Date: 04/29/2018 CLINICAL DATA:  Initial  evaluation for acute left lower quadrant pain. EXAM: CT ABDOMEN AND PELVIS WITH CONTRAST TECHNIQUE:  Multidetector CT imaging of the abdomen and pelvis was performed using the standard protocol following bolus administration of intravenous contrast. CONTRAST:  100mL ISOVUE-300 IOPAMIDOL (ISOVUE-300) INJECTION 61% COMPARISON:  Prior CT from 02/01/2016. FINDINGS: Lower chest: Visualized lung bases are clear. Hepatobiliary: Mild diffuse hypoattenuation liver suggestive of steatosis. 2.8 cm cyst present within the left hepatic lobe. Liver otherwise unremarkable. Gallbladder surgically absent. No biliary dilatation. Pancreas: Pancreas within normal limits. Spleen: Spleen within normal limits. Adrenals/Urinary Tract: Adrenal glands are normal. Kidneys equal size with symmetric enhancement. No nephrolithiasis, hydronephrosis, or focal enhancing renal mass. No hydroureter. Partially distended bladder within normal limits. Stomach/Bowel: Stomach within normal limits. No evidence for bowel obstruction. Normal appendix. Inflammatory stranding with wall thickening about several diverticula at the sigmoid colon in the left lower quadrant, consistent with acute diverticulitis (series 2, image 78). No evidence for perforation or other complication. No other acute inflammatory changes about the bowels. Vascular/Lymphatic: Normal intravascular enhancement seen throughout the intra-abdominal aorta and its branch vessels. No adenopathy. Reproductive: Uterus is surgically absent. Tiny subcentimeter cysts with calcification noted within the left ovary. Right ovary not discretely identified. Other: No free intraperitoneal air. Small volume free fluid within the pelvis, which may be physiologic and/or reactive. Musculoskeletal: No acute osseous abnormality. No worrisome lytic or blastic osseous lesions. IMPRESSION: 1. Findings consistent with acute sigmoid diverticulitis. No evidence for perforation or other complication. 2. Mild hepatic steatosis. 3. Prior cholecystectomy and hysterectomy. Electronically Signed   By: Rise MuBenjamin   McClintock M.D.   On: 04/29/2018 18:05     ASSESSMENT AND PLAN:   37 yo female w/ hx PSVT, GERD, Anxiety, OA who was admitted to the hospital due to abdominal pain noted to have acute sigmoid diverticulitis.  1.  Acute diverticulitis-this is a cause of patient's abdominal pain.  Patient's not improved since yesterday on supportive care with IV fluids, antiemetics and IV meropenem. -We will continue supportive care for now follow clinically.  White cell count has improved since yesterday. - Seen by Gen. Surgery and cont. Current care and no acute surgical intervention.   2. Anxiety/Depression - cont. Xanax, Cymbalta, Zoloft  3. Neuropathy - cont. Neurontin    All the records are reviewed and case discussed with Care Management/Social Worker. Management plans discussed with the patient, family and they are in agreement.  CODE STATUS: Full code  DVT Prophylaxis: Lovenox  TOTAL TIME TAKING CARE OF THIS PATIENT: 25 minutes.   POSSIBLE D/C IN 1-2 DAYS, DEPENDING ON CLINICAL CONDITION.   Houston SirenSAINANI,VIVEK J M.D on 04/30/2018 at 1:23 PM  Between 7am to 6pm - Pager - [phone]  After 6pm go to [email or web address] - Social research officer, [email or web address] [email or web address]  [facility] [place] [facility]  Office  [phone]  CC: Primary care physician; Danella PentonMiller, Mark F, MD

## 2018-04-30 NOTE — Consult Note (Signed)
Surgical Consultation  04/30/2018  Brenda Sellers is an 37 y.o. female.   Referring Physician: Andee Poles  CC: Left lower quadrant pain  HPI: This a patient with 2 to 3 days of left lower quadrant abdominal pain.  She is never had an episode like this before.  She denies fevers or chills.  She is not vomiting.  She has been diagnosed with acute diverticulitis without abscess based on CT findings.  Not diabetic.  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  No family history significant for diverticulitis  Past Medical History:  Diagnosis Date  . Anxiety   . Arthritis   . Complication of anesthesia   . GERD (gastroesophageal reflux disease)    OCC  . Headache   . Nerve pain   . Pericardial cyst   . PONV (postoperative nausea and vomiting)    Last surgery 06/2014- nausea not vomitting was pre medicated.  Marland Kitchen PSVT (paroxysmal supraventricular tachycardia) ([place])     Past Surgical History:  Procedure Laterality Date  . ABDOMINAL HYSTERECTOMY    . ANTERIOR CRUCIATE LIGAMENT REPAIR Left 1998   [place], PCL Screw  . CHOLECYSTECTOMY    . CHONDROPLASTY Right 04/10/2017   Procedure: CHONDROPLASTY;  Surgeon: Frederik Pear, MD;  Location: [place];  Service: Orthopedics;  Laterality: Right;  . JOINT REPLACEMENT    . KNEE ARTHROSCOPY Right    Men[place]  . KNEE ARTHROSCOPY Right 04/10/2017   Procedure: ARTHROSCOPY KNEE;  Surgeon: Frederik Pear, MD;  Location: [place];  Service: Orthopedics;  Laterality: Right;  . KNEE ARTHROSCOPY WITH LATERAL MENISECTOMY Right 04/10/2017   Procedure: KNEE ARTHROSCOPY WITH  PARTIAL LATERAL MENISECTOMY;  Surgeon: Frederik Pear, MD;  Location: [place];  Service: Orthopedics;  Laterality: Right;  . LAPAROSCOPIC TOTAL HYSTERECTOMY  07/04/14  . ORIF ELBOW FRACTURE Left 04/04/2015   Procedure: OPEN REDUCTION INTERNAL FIXATION (ORIF) LEFT ELBOW/OLECRANON FRACTURE;  Surgeon: Iran Planas, MD;  Location: [place];  Service: Orthopedics;  Laterality: Left;  . right total knee replacement    . TONSILLECTOMY    . TOTAL KNEE ARTHROPLASTY Right 12/07/2017   Procedure: RIGHT TOTAL KNEE ARTHROPLASTY;  Surgeon: Frederik Pear, MD;  Location: [place];  Service: Orthopedics;  Laterality: Right;    Family History  Problem Relation Age of Onset  . Ovarian cancer Mother   . Esophageal cancer Father     Social History:  reports that she has quit smoking. She quit after 10.00 years of use. She has never used smokeless tobacco. She reports that she drinks alcohol. She reports that she does not use drugs.  Allergies:  Allergies  Allergen Reactions  . Augmentin [Amoxicillin-Pot Clavulanate] Rash and Other (See Comments)    Can take any other PCNs Has patient had a PCN reaction causing immediate rash, facial/tongue/throat swelling, SOB or lightheadedness with hypotension: Yes Has patient had a PCN reaction causing severe rash involving mucus membranes or skin necrosis: No Has patient had a PCN reaction that required hospitalization: No Has patient had a PCN reaction occurring within the last 10 years: No If all of the above answers are "NO", then may proceed with Cephalosporin use.   . Vicodin [Hydrocodone-Acetaminophen] Nausea And Vomiting, Rash and Nausea Only  . Codeine Sulfate Nausea And Vomiting  . Hydrocodone-Acetaminophen Nausea Only  . Hydrocodone-Homatropine Other (See Comments)    Medications reviewed.   Review of Systems:   Review of Systems  Constitutional: Negative.  HENT: Negative.   Eyes: Negative.   Respiratory: Negative.   Cardiovascular: Negative.   Gastrointestinal: Positive for abdominal pain. Negative for diarrhea and vomiting.  Genitourinary: Negative.   Musculoskeletal: Negative.   Skin: Negative.   Neurological: Negative.   Endo/Heme/Allergies: Negative.   Psychiatric/Behavioral: Negative.      Physical Exam:  BP 113/75 (BP Location: Right Arm)   Pulse 81   Temp  (!) 97.4 F (36.3 C) (Oral)   Resp 20   Ht '5\' 8"'  (1.727 m)   Wt 227 lb 15.3 oz (103.4 kg)   SpO2 100%   BMI 34.66 kg/m   Physical Exam  Constitutional: She appears well-developed and well-nourished.  Obese female in no acute distress  HENT:  Head: Normocephalic and atraumatic.  Eyes: Pupils are equal, round, and reactive to light. EOM are normal.  Cardiovascular: Normal rate.  Pulmonary/Chest: Effort normal. No respiratory distress.  Abdominal: Soft. Normal appearance. There is tenderness in the left lower quadrant. There is no rebound, no guarding and no CVA tenderness. Hernia confirmed negative in the ventral area.  Skin: Skin is warm and dry.  Vitals reviewed.     Results for orders placed or performed during the hospital encounter of 04/29/18 (from the past 48 hour(s))  Urinalysis, Complete w Microscopic     Status: Abnormal   Collection Time: 04/29/18  7:39 PM  Result Value Ref Range   Color, Urine YELLOW (A) YELLOW   APPearance CLEAR (A) CLEAR   Specific Gravity, Urine >1.046 (H) 1.005 - 1.030   pH 5.0 5.0 - 8.0   Glucose, UA NEGATIVE NEGATIVE mg/dL   Hgb urine dipstick NEGATIVE NEGATIVE   Bilirubin Urine NEGATIVE NEGATIVE   Ketones, ur NEGATIVE NEGATIVE mg/dL   Protein, ur NEGATIVE NEGATIVE mg/dL   Nitrite NEGATIVE NEGATIVE   Leukocytes, UA NEGATIVE NEGATIVE   RBC / HPF 0-5 0 - 5 RBC/hpf   WBC, UA 0-5 0 - 5 WBC/hpf   Bacteria, UA NONE SEEN NONE SEEN   Squamous Epithelial / LPF 0-5 0 - 5    Comment: Performed at [facility], [address] [place], [place] [address]  CBC with Differential     Status: Abnormal   Collection Time: 04/29/18  7:39 PM  Result Value Ref Range   WBC 14.6 (H) 3.6 - 11.0 K/uL   RBC 4.78 3.80 - 5.20 MIL/uL   Hemoglobin 14.1 12.0 - 16.0 g/dL   HCT 41.3 35.0 - 47.0 %   MCV 86.5 80.0 - 100.0 fL   MCH 29.6 26.0 - 34.0 pg   MCHC 34.2 32.0 - 36.0 g/dL   RDW 13.6 11.5 - 14.5 %   Platelets 309 150 - 440 K/uL   Neutrophils  Relative % 67 %   Neutro Abs 9.7 (H) 1.4 - 6.5 K/uL   Lymphocytes Relative 24 %   Lymphs Abs 3.6 1.0 - 3.6 K/uL   Monocytes Relative 8 %   Monocytes Absolute 1.1 (H) 0.2 - 0.9 K/uL   Eosinophils Relative 1 %   Eosinophils Absolute 0.1 0 - 0.7 K/uL   Basophils Relative 0 %   Basophils Absolute 0.1 0 - 0.1 K/uL    Comment: Performed at [facility], [address] [place], [place] [address]  Comprehensive metabolic panel     Status: Abnormal   Collection Time: 04/29/18  7:39 PM  Result Value Ref Range   Sodium 137 135 - 145 mmol/L   Potassium 4.2 3.5 - 5.1 mmol/L  Chloride 99 (L) 101 - 111 mmol/L   CO2 26 22 - 32 mmol/L   Glucose, Bld 95 65 - 99 mg/dL   BUN 10 6 - 20 mg/dL   Creatinine, Ser 0.79 0.44 - 1.00 mg/dL   Calcium 9.4 8.9 - 10.3 mg/dL   Total Protein 8.3 (H) 6.5 - 8.1 g/dL   Albumin 4.9 3.5 - 5.0 g/dL   AST 31 15 - 41 U/L   ALT 50 14 - 54 U/L   Alkaline Phosphatase 56 38 - 126 U/L   Total Bilirubin 0.8 0.3 - 1.2 mg/dL   GFR calc non Af Amer >60 >60 mL/min   GFR calc Af Amer >60 >60 mL/min    Comment: (NOTE) The eGFR has been calculated using the CKD EPI equation. This calculation has not been validated in all clinical situations. eGFR's persistently <60 mL/min signify possible Chronic Kidney Disease.    Anion gap 12 5 - 15    Comment: Performed at [facility], [address] [place], [place] [address]  Basic metabolic panel     Status: Abnormal   Collection Time: 04/30/18  5:02 AM  Result Value Ref Range   Sodium 138 135 - 145 mmol/L   Potassium 3.9 3.5 - 5.1 mmol/L   Chloride 104 101 - 111 mmol/L   CO2 26 22 - 32 mmol/L   Glucose, Bld 93 65 - 99 mg/dL   BUN 9 6 - 20 mg/dL   Creatinine, Ser 0.74 0.44 - 1.00 mg/dL   Calcium 8.2 (L) 8.9 - 10.3 mg/dL   GFR calc non Af Amer >60 >60 mL/min   GFR calc Af Amer >60 >60 mL/min    Comment: (NOTE) The eGFR has been calculated using the CKD EPI equation. This calculation has not been validated  in all clinical situations. eGFR's persistently <60 mL/min signify possible Chronic Kidney Disease.    Anion gap 8 5 - 15    Comment: Performed at [facility], [address] [place], [place] [address]  CBC     Status: Abnormal   Collection Time: 04/30/18  5:02 AM  Result Value Ref Range   WBC 7.3 3.6 - 11.0 K/uL   RBC 3.98 3.80 - 5.20 MIL/uL   Hemoglobin 11.8 (L) 12.0 - 16.0 g/dL   HCT 34.4 (L) 35.0 - 47.0 %   MCV 86.5 80.0 - 100.0 fL   MCH 29.6 26.0 - 34.0 pg   MCHC 34.2 32.0 - 36.0 g/dL   RDW 13.4 11.5 - 14.5 %   Platelets 236 150 - 440 K/uL    Comment: Performed at [facility], [address], [place],  [address]   Ct Abdomen Pelvis W Contrast  Result Date: 04/29/2018 CLINICAL DATA:  Initial evaluation for acute left lower quadrant pain. EXAM: CT ABDOMEN AND PELVIS WITH CONTRAST TECHNIQUE: Multidetector CT imaging of the abdomen and pelvis was performed using the standard protocol following bolus administration of intravenous contrast. CONTRAST:  184m ISOVUE-300 IOPAMIDOL (ISOVUE-300) INJECTION 61% COMPARISON:  Prior CT from 02/01/2016. FINDINGS: Lower chest: Visualized lung bases are clear. Hepatobiliary: Mild diffuse hypoattenuation liver suggestive of steatosis. 2.8 cm cyst present within the left hepatic lobe. Liver otherwise unremarkable. Gallbladder surgically absent. No biliary dilatation. Pancreas: Pancreas within normal limits. Spleen: Spleen within normal limits. Adrenals/Urinary Tract: Adrenal glands are normal. Kidneys equal size with symmetric enhancement. No nephrolithiasis, hydronephrosis, or focal enhancing renal mass. No hydroureter. Partially distended bladder within normal limits. Stomach/Bowel: Stomach within normal limits.  No evidence for bowel obstruction. Normal appendix. Inflammatory stranding with wall thickening about several diverticula at the sigmoid colon in the left lower quadrant, consistent with acute diverticulitis (series 2,  image 78). No evidence for perforation or other complication. No other acute inflammatory changes about the bowels. Vascular/Lymphatic: Normal intravascular enhancement seen throughout the intra-abdominal aorta and its branch vessels. No adenopathy. Reproductive: Uterus is surgically absent. Tiny subcentimeter cysts with calcification noted within the left ovary. Right ovary not discretely identified. Other: No free intraperitoneal air. Small volume free fluid within the pelvis, which may be physiologic and/or reactive. Musculoskeletal: No acute osseous abnormality. No worrisome lytic or blastic osseous lesions. IMPRESSION: 1. Findings consistent with acute sigmoid diverticulitis. No evidence for perforation or other complication. 2. Mild hepatic steatosis. 3. Prior cholecystectomy and hysterectomy. Electronically Signed   By: Jeannine Boga M.D.   On: 04/29/2018 18:05    Assessment/Plan:  Labs and CT scan are personally reviewed.  Currently the patient is experiencing her first episode of uncomplicated diverticulitis.  She warrants IV antibiotics.  No surgical intervention or drainage procedure required at this time.  Continue to observe.  Florene Glen, MD, FACS

## 2018-05-01 LAB — HIV ANTIBODY (ROUTINE TESTING W REFLEX): HIV Screen 4th Generation wRfx: NONREACTIVE

## 2018-05-01 MED ORDER — ONDANSETRON HCL 4 MG/2ML IJ SOLN: 4.0000 mg | Freq: Four times a day (QID) | INTRAMUSCULAR | Status: DC

## 2018-05-01 MED ORDER — IBUPROFEN 400 MG PO TABS
600.0000 mg | ORAL_TABLET | Freq: Once | ORAL | Status: AC
  Administered 2018-05-01: 600 mg via ORAL
  Filled 2018-05-01: qty 2

## 2018-05-01 MED ORDER — ONDANSETRON HCL 4 MG/2ML IJ SOLN
4.0000 mg | Freq: Four times a day (QID) | INTRAMUSCULAR | Status: DC | PRN
  Administered 2018-05-01 – 2018-05-06 (×10): 4 mg via INTRAVENOUS
  Filled 2018-05-01 (×11): qty 2

## 2018-05-01 NOTE — Progress Notes (Signed)
ANTIBIOTIC CONSULT NOTE - INITIAL  Pharmacy Consult for Meropenem  Indication: intra abdominal infection  Allergies  Allergen Reactions  . Augmentin [Amoxicillin-Pot Clavulanate] Rash and Other (See Comments)    Can take any other PCNs Has patient had a PCN reaction causing immediate rash, facial/tongue/throat swelling, SOB or lightheadedness with hypotension: Yes Has patient had a PCN reaction causing severe rash involving mucus membranes or skin necrosis: No Has patient had a PCN reaction that required hospitalization: No Has patient had a PCN reaction occurring within the last 10 years: No If all of the above answers are "NO", then may proceed with Cephalosporin use.   . Vicodin [Hydrocodone-Acetaminophen] Nausea And Vomiting, Rash and Nausea Only  . Codeine Sulfate Nausea And Vomiting  . Hydrocodone-Acetaminophen Nausea Only  . Hydrocodone-Homatropine Other (See Comments)    Patient Measurements: Height: 5\' 8"  (172.7 cm) Weight: 227 lb 15.3 oz (103.4 kg) IBW/kg (Calculated) : 63.9 Adjusted Body Weight:   Vital Signs: Temp: 98.4 F (36.9 C) (06/08 0521) Temp Source: Oral (06/08 0521) BP: 110/53 (06/08 0521) Pulse Rate: 83 (06/08 0521) Intake/Output from previous day: 06/07 0701 - 06/08 0700 In: 2767.3 [I.V.:2467.3; IV Piggyback:300] Out: 1850 [Urine:1850] Intake/Output from this shift: Total I/O In: 384 [I.V.:384] Out: -   Labs: Recent Labs    04/29/18 1939 04/30/18 0502  WBC 14.6* 7.3  HGB 14.1 11.8*  PLT 309 236  CREATININE 0.79 0.74   Estimated Creatinine Clearance: 120 mL/min (by C-G formula based on SCr of 0.74 mg/dL). No results for input(s): VANCOTROUGH, VANCOPEAK, VANCORANDOM, GENTTROUGH, GENTPEAK, GENTRANDOM, TOBRATROUGH, TOBRAPEAK, TOBRARND, AMIKACINPEAK, AMIKACINTROU, AMIKACIN in the last 72 hours.   Microbiology: No results found for this or any previous visit (from the past 720 hour(s)).  Medical History: Past Medical History:  Diagnosis  Date  . Anxiety   . Arthritis   . Complication of anesthesia   . GERD (gastroesophageal reflux disease)    OCC  . Headache   . Nerve pain   . Pericardial cyst   . PONV (postoperative nausea and vomiting)    Last surgery 06/2014- nausea not vomitting was pre medicated.  Marland Kitchen. PSVT (paroxysmal supraventricular tachycardia) ([facility])     Medications:  Medications Prior to Admission  Medication Sig Dispense Refill Last Dose  . ALPRAZolam (XANAX) 0.5 MG tablet Take 0.5 mg by mouth 2 (two) times daily as needed for anxiety.    PRN at PRN  . DULoxetine (CYMBALTA) 60 MG capsule Take 60 mg by mouth daily.    04/29/2018 at am  . gabapentin (NEURONTIN) 300 MG capsule Take 300 mg by mouth 2 (two) times daily.   04/27/2018 at pm  . methocarbamol (ROBAXIN) 750 MG tablet Take 1 tablet (750 mg total) by mouth every 6 (six) hours as needed for muscle spasms. 60 tablet 0 PRN at PRN  . sertraline (ZOLOFT) 100 MG tablet Take 100 mg by mouth daily.   04/29/2018 at am  . temazepam (RESTORIL) 30 MG capsule Take 30 mg by mouth at bedtime as needed for sleep.    PRN at PRN  . traMADol (ULTRAM) 50 MG tablet Take 50 mg by mouth See admin instructions. Take 50 mg by mouth up to 5 times daily as needed for pain   PRN at PRN  . aspirin EC 325 MG tablet Take 1 tablet (325 mg total) by mouth 2 (two) times daily. (Patient not taking: Reported on 04/29/2018) 30 tablet 0 Not Taking at Unknown time  . celecoxib (CELEBREX) 200 MG capsule  Take 1 capsule (200 mg total) by mouth 2 (two) times daily. (Patient not taking: Reported on 04/29/2018) 60 capsule 1 Not Taking at Unknown time  . oxyCODONE-acetaminophen (ROXICET) 5-325 MG tablet Take 1 tablet by mouth every 4 (four) hours as needed. (Patient not taking: Reported on 04/29/2018) 30 tablet 0 Not Taking at Unknown time   Assessment: Pharmacy consulted for meropenem dosing in 37 yo female with  Diverticulitis.  Goal of Therapy:  resolution of infection  Plan:  Expected duration 7 days with  resolution of temperature and/or normalization of WBC   Continue Meropenem 1 gm IV Q8H   Brenda Sellers 05/01/2018,12:10 PM

## 2018-05-01 NOTE — Progress Notes (Signed)
Subjective: This patient with diverticulitis with left lower quadrant pain.  She states she is not much better.  No nausea vomiting fevers or chills  Objective: Vital signs in last 24 hours: Temp:  [98.4 F (36.9 C)] 98.4 F (36.9 C) (06/08 0521) Pulse Rate:  [83-99] 83 (06/08 0521) Resp:  [18-20] 18 (06/08 0521) BP: (110-126)/(53-70) 110/53 (06/08 0521) SpO2:  [96 %-100 %] 96 % (06/08 0521) Last BM Date: 04/30/18  Intake/Output from previous day: 06/07 0701 - 06/08 0700 In: 2767.3 [I.V.:2467.3; IV Piggyback:300] Out: 1850 [Urine:1850] Intake/Output this shift: No intake/output data recorded.  Physical exam:  Afebrile vital signs stable Appears comfortable Abdomen is soft nondistended nontympanitic tender in the left lower quadrant with minimal guarding if any no rebound or percussion tenderness nontender calves  Lab Results: CBC  Recent Labs    04/29/18 1939 04/30/18 0502  WBC 14.6* 7.3  HGB 14.1 11.8*  HCT 41.3 34.4*  PLT 309 236   BMET Recent Labs    04/29/18 1939 04/30/18 0502  NA 137 138  K 4.2 3.9  CL 99* 104  CO2 26 26  GLUCOSE 95 93  BUN 10 9  CREATININE 0.79 0.74  CALCIUM 9.4 8.2*   PT/INR No results for input(s): LABPROT, INR in the last 72 hours. ABG No results for input(s): PHART, HCO3 in the last 72 hours.  Invalid input(s): PCO2, PO2  Studies/Results: Ct Abdomen Pelvis W Contrast  Result Date: 04/29/2018 CLINICAL DATA:  Initial evaluation for acute left lower quadrant pain. EXAM: CT ABDOMEN AND PELVIS WITH CONTRAST TECHNIQUE: Multidetector CT imaging of the abdomen and pelvis was performed using the standard protocol following bolus administration of intravenous contrast. CONTRAST:  100mL ISOVUE-300 IOPAMIDOL (ISOVUE-300) INJECTION 61% COMPARISON:  Prior CT from 02/01/2016. FINDINGS: Lower chest: Visualized lung bases are clear. Hepatobiliary: Mild diffuse hypoattenuation liver suggestive of steatosis. 2.8 cm cyst present within the left  hepatic lobe. Liver otherwise unremarkable. Gallbladder surgically absent. No biliary dilatation. Pancreas: Pancreas within normal limits. Spleen: Spleen within normal limits. Adrenals/Urinary Tract: Adrenal glands are normal. Kidneys equal size with symmetric enhancement. No nephrolithiasis, hydronephrosis, or focal enhancing renal mass. No hydroureter. Partially distended bladder within normal limits. Stomach/Bowel: Stomach within normal limits. No evidence for bowel obstruction. Normal appendix. Inflammatory stranding with wall thickening about several diverticula at the sigmoid colon in the left lower quadrant, consistent with acute diverticulitis (series 2, image 78). No evidence for perforation or other complication. No other acute inflammatory changes about the bowels. Vascular/Lymphatic: Normal intravascular enhancement seen throughout the intra-abdominal aorta and its branch vessels. No adenopathy. Reproductive: Uterus is surgically absent. Tiny subcentimeter cysts with calcification noted within the left ovary. Right ovary not discretely identified. Other: No free intraperitoneal air. Small volume free fluid within the pelvis, which may be physiologic and/or reactive. Musculoskeletal: No acute osseous abnormality. No worrisome lytic or blastic osseous lesions. IMPRESSION: 1. Findings consistent with acute sigmoid diverticulitis. No evidence for perforation or other complication. 2. Mild hepatic steatosis. 3. Prior cholecystectomy and hysterectomy. Electronically Signed   By: Rise MuBenjamin  McClintock M.D.   On: 04/29/2018 18:05    Anti-infectives: Anti-infectives (From admission, onward)   Start     Dose/Rate Route Frequency Ordered Stop   04/29/18 2115  meropenem (MERREM) 1 g in sodium chloride 0.9 % 100 mL IVPB     1 g 200 mL/hr over 30 Minutes Intravenous Every 8 hours 04/29/18 2107     04/29/18 1930  ciprofloxacin (CIPRO) IVPB 400 mg  400 mg 200 mL/hr over 60 Minutes Intravenous  Once 04/29/18  1926 04/29/18 2051   04/29/18 1930  metroNIDAZOLE (FLAGYL) IVPB 500 mg     500 mg 100 mL/hr over 60 Minutes Intravenous  Once 04/29/18 1926 04/29/18 2158      Assessment/Plan: s/p    Patient doing well with acute diverticulitis.  This is an uncomplicated diverticulitis case.  She should continue IV antibiotics till her pain is improved and now that her white blood cell count has normalized and is afebrile she could likely go home tomorrow on oral antibiotics to follow-up in 2 weeks  Lattie Haw, MD, Carilyn Goodpasture  05/01/2018

## 2018-05-01 NOTE — Progress Notes (Signed)
[facility] - [place] at [facility]   PATIENT NAME: Brenda Sellers    MR#:  [id number]  DATE OF BIRTH:  1981-10-21  SUBJECTIVE:   Pt. Here due to abdominal pain and noted to have acute diverticulitis.  Patient WBC normalized, no more fever.  Still has left lower quadrant abdominal pain.  No nausea.  Willing to try soft diet for lunch.  REVIEW OF SYSTEMS:    Review of Systems  Constitutional: Negative for chills and fever.  HENT: Negative for congestion and tinnitus.   Eyes: Negative for blurred vision and double vision.  Respiratory: Negative for cough, shortness of breath and wheezing.   Cardiovascular: Negative for chest pain, orthopnea and PND.  Gastrointestinal: Positive for abdominal pain (LLQ). Negative for diarrhea, nausea and vomiting.  Genitourinary: Negative for dysuria and hematuria.  Neurological: Negative for dizziness, sensory change and focal weakness.  All other systems reviewed and are negative.   Nutrition: NPO Tolerating Diet: NO Tolerating PT: Await Eval.    DRUG ALLERGIES:   Allergies  Allergen Reactions  . Augmentin [Amoxicillin-Pot Clavulanate] Rash and Other (See Comments)    Can take any other PCNs Has patient had a PCN reaction causing immediate rash, facial/tongue/throat swelling, SOB or lightheadedness with hypotension: Yes Has patient had a PCN reaction causing severe rash involving mucus membranes or skin necrosis: No Has patient had a PCN reaction that required hospitalization: No Has patient had a PCN reaction occurring within the last 10 years: No If all of the above answers are "NO", then may proceed with Cephalosporin use.   . Vicodin [Hydrocodone-Acetaminophen] Nausea And Vomiting, Rash and Nausea Only  . Codeine Sulfate Nausea And Vomiting  . Hydrocodone-Acetaminophen Nausea Only  . Hydrocodone-Homatropine Other (See Comments)    VITALS:  Blood pressure (!) 110/53, pulse 83, temperature 98.4 F (36.9 C), temperature  source Oral, resp. rate 18, height 5\' 8"  (1.727 m), weight 103.4 kg (227 lb 15.3 oz), SpO2 96 %.  PHYSICAL EXAMINATION:   Physical Exam  GENERAL:  37 y.o.-year-old patient lying in bed in no acute distress.  EYES: Pupils equal, round, reactive to light and accommodation. No scleral icterus. Extraocular muscles intact.  HEENT: Head atraumatic, normocephalic. Oropharynx and nasopharynx clear.  NECK:  Supple, no jugular venous distention. No thyroid enlargement, no tenderness.  LUNGS: Normal breath sounds bilaterally, no wheezing, rales, rhonchi. No use of accessory muscles of respiration.  CARDIOVASCULAR: S1, S2 normal. No murmurs, rubs, or gallops.  ABDOMEN: Soft, Tender in LLQ, No rebound, rigidity, nondistended. Bowel sounds present. No organomegaly or mass.  EXTREMITIES: No cyanosis, clubbing or edema b/l.    NEUROLOGIC: Cranial nerves II through XII are intact. No focal Motor or sensory deficits b/l.   PSYCHIATRIC: The patient is alert and oriented x 3.  SKIN: No obvious rash, lesion, or ulcer.    LABORATORY PANEL:   CBC Recent Labs  Lab 04/30/18 0502  WBC 7.3  HGB 11.8*  HCT 34.4*  PLT 236   ------------------------------------------------------------------------------------------------------------------  Chemistries  Recent Labs  Lab 04/29/18 1939 04/30/18 0502  NA 137 138  K 4.2 3.9  CL 99* 104  CO2 26 26  GLUCOSE 95 93  BUN 10 9  CREATININE 0.79 0.74  CALCIUM 9.4 8.2*  AST 31  --   ALT 50  --   ALKPHOS 56  --   BILITOT 0.8  --    ------------------------------------------------------------------------------------------------------------------  Cardiac Enzymes No results for input(s): TROPONINI in the last 168 hours. ------------------------------------------------------------------------------------------------------------------  RADIOLOGY:  Ct Abdomen Pelvis W Contrast  Result Date: 04/29/2018 CLINICAL DATA:  Initial evaluation for acute left lower  quadrant pain. EXAM: CT ABDOMEN AND PELVIS WITH CONTRAST TECHNIQUE: Multidetector CT imaging of the abdomen and pelvis was performed using the standard protocol following bolus administration of intravenous contrast. CONTRAST:  100mL ISOVUE-300 IOPAMIDOL (ISOVUE-300) INJECTION 61% COMPARISON:  Prior CT from 02/01/2016. FINDINGS: Lower chest: Visualized lung bases are clear. Hepatobiliary: Mild diffuse hypoattenuation liver suggestive of steatosis. 2.8 cm cyst present within the left hepatic lobe. Liver otherwise unremarkable. Gallbladder surgically absent. No biliary dilatation. Pancreas: Pancreas within normal limits. Spleen: Spleen within normal limits. Adrenals/Urinary Tract: Adrenal glands are normal. Kidneys equal size with symmetric enhancement. No nephrolithiasis, hydronephrosis, or focal enhancing renal mass. No hydroureter. Partially distended bladder within normal limits. Stomach/Bowel: Stomach within normal limits. No evidence for bowel obstruction. Normal appendix. Inflammatory stranding with wall thickening about several diverticula at the sigmoid colon in the left lower quadrant, consistent with acute diverticulitis (series 2, image 78). No evidence for perforation or other complication. No other acute inflammatory changes about the bowels. Vascular/Lymphatic: Normal intravascular enhancement seen throughout the intra-abdominal aorta and its branch vessels. No adenopathy. Reproductive: Uterus is surgically absent. Tiny subcentimeter cysts with calcification noted within the left ovary. Right ovary not discretely identified. Other: No free intraperitoneal air. Small volume free fluid within the pelvis, which may be physiologic and/or reactive. Musculoskeletal: No acute osseous abnormality. No worrisome lytic or blastic osseous lesions. IMPRESSION: 1. Findings consistent with acute sigmoid diverticulitis. No evidence for perforation or other complication. 2. Mild hepatic steatosis. 3. Prior  cholecystectomy and hysterectomy. Electronically Signed   By: Rise MuBenjamin  McClintock M.D.   On: 04/29/2018 18:05     ASSESSMENT AND PLAN:   37 yo female w/ hx PSVT, GERD, Anxiety, OA who was admitted to the hospital due to abdominal pain noted to have acute sigmoid diverticulitis.  1.  Acute  sigmoid colon diverticulitis: This is the cause of abdominal pain, WBC normalized.  Continue IV fluids because patient still has some pain and nausea.  Continue IV antibiotics, supportive care.  Advance the diet to soft diet and see how she does.  Likely discharge tomorrow - Seen by Gen. Surgery and cont. Current care and no acute surgical intervention.   2. Anxiety/Depression - cont. Xanax, Cymbalta, Zoloft  3. Neuropathy - cont. Neurontin  Continue IV fluids but decrease the rate, increase p.o. intake, advance her diet to soft diet, likely discharge tomorrow home with antibiotics for 2 weeks  All the records are reviewed and case discussed with Care Management/Social Worker. Management plans discussed with the patient, family and they are in agreement.  CODE STATUS: Full code  DVT Prophylaxis: Lovenox  TOTAL TIME TAKING CARE OF THIS PATIENT: 25 minutes.   POSSIBLE D/C IN 1-2 DAYS, DEPENDING ON CLINICAL CONDITION.   Katha HammingSnehalatha Adlai Nieblas M.D on 05/01/2018 at 10:57 AM  Between 7am to 6pm - Pager - [phone]  After 6pm go to [email or web address] - Social research officer, governmentpassword EPAS [facility]  [facility] [place] [facility]  Office  [phone]  CC: Primary care physician; Danella PentonMiller, Mark F, MD

## 2018-05-01 NOTE — Progress Notes (Signed)
Attempted IV start x2.  Unable to obtain.  IV consult placed and primary RN Melissa notified.

## 2018-05-02 DIAGNOSIS — K5792 Diverticulitis of intestine, part unspecified, without perforation or abscess without bleeding: Secondary | ICD-10-CM

## 2018-05-02 MED ORDER — IBUPROFEN 400 MG PO TABS
400.0000 mg | ORAL_TABLET | Freq: Once | ORAL | Status: AC
  Administered 2018-05-02: 400 mg via ORAL
  Filled 2018-05-02: qty 1

## 2018-05-02 NOTE — Progress Notes (Signed)
Patient ID: Sampson SiStacy I Medlen, female   DOB: 1980-12-14, 37 y.o.   MRN: [id number]     SURGICAL PROGRESS NOTE   Hospital Day(s): 3.   Post op day(s):  Marland Kitchen.   Interval History: Patient seen and examined, no acute events or new complaints overnight. Patient reports no improvement of pain. Refers did not tolerated soft diet. Had vomiting with the soft diet.   Vital signs in last 24 hours: [min-max] current  Temp:  [98.3 F (36.8 C)-98.9 F (37.2 C)] 98.3 F (36.8 C) (06/09 0508) Pulse Rate:  [86-91] 86 (06/09 0508) Resp:  [20] 20 (06/09 0508) BP: (115-118)/(55-70) 118/70 (06/09 0508) SpO2:  [95 %-97 %] 95 % (06/09 0508)     Height: 5\' 8"  (172.7 cm) Weight: 103.4 kg (227 lb 15.3 oz) BMI (Calculated): 34.67   Physical Exam:  Constitutional: alert, cooperative and no distress  Respiratory: breathing non-labored at rest  Cardiovascular: regular rate and sinus rhythm  Gastrointestinal: soft, moderate-tender on left lower quadrant, and non-distended  Labs:  CBC Latest Ref Rng & Units 04/30/2018 04/29/2018 12/09/2017  WBC 3.6 - 11.0 K/uL 7.3 14.6(H) 14.0(H)  Hemoglobin 12.0 - 16.0 g/dL 11.8(L) 14.1 11.0(L)  Hematocrit 35.0 - 47.0 % 34.4(L) 41.3 32.8(L)  Platelets 150 - 440 K/uL 236 309 247   CMP Latest Ref Rng & Units 04/30/2018 04/29/2018 11/26/2017  Glucose 65 - 99 mg/dL 93 95 782(N120(H)  BUN 6 - 20 mg/dL 9 10 11   Creatinine 0.44 - 1.00 mg/dL 5.620.74 1.300.79 8.650.73  Sodium 135 - 145 mmol/L 138 137 139  Potassium 3.5 - 5.1 mmol/L 3.9 4.2 3.9  Chloride 101 - 111 mmol/L 104 99(L) 105  CO2 22 - 32 mmol/L 26 26 27   Calcium 8.9 - 10.3 mg/dL 8.2(L) 9.4 9.3  Total Protein 6.5 - 8.1 g/dL - 8.3(H) -  Total Bilirubin 0.3 - 1.2 mg/dL - 0.8 -  Alkaline Phos 38 - 126 U/L - 56 -  AST 15 - 41 U/L - 31 -  ALT 14 - 54 U/L - 50 -    Imaging studies: No new pertinent imaging studies   Assessment/Plan:  37 y.o. female with acute uncomplicated diverticulitis. Pain not improving and did not tolerated soft diet. Agree to  change diet to full liquid and assess for toleration. Agree to continue IV antibiotics. Out of bed. Patient oriented that diarrhea is normal. No fever, no leukocytosis. Will continue to follow with abdominal exam and diet toleration.   Gae GallopEdgardo Cintrn-Daz, MD

## 2018-05-02 NOTE — Progress Notes (Signed)
[facility] - [place] at [facility]   PATIENT NAME: Brenda PellegriniStacy Sellers    MR#:  [id number]  DATE OF BIRTH:  [birth year]  SUBJECTIVE:   Pt. Here due to abdominal pain and noted to have acute diverticulitis.  Patient WBC normalized, no more fever.  Still has left lower quadrant abdominal pain.  Did not tolerate soft diet yesterday, her nausea came back and also abdominal pain got worse.  No fever Still complains of left lower current abdominal pain 6 out of 10 in severity. REVIEW OF SYSTEMS:    Review of Systems  Constitutional: Negative for chills and fever.  HENT: Negative for congestion and tinnitus.   Eyes: Negative for blurred vision and double vision.  Respiratory: Negative for cough, shortness of breath and wheezing.   Cardiovascular: Negative for chest pain, orthopnea and PND.  Gastrointestinal: Positive for abdominal pain (LLQ). Negative for diarrhea, nausea and vomiting.  Genitourinary: Negative for dysuria and hematuria.  Neurological: Negative for dizziness, sensory change and focal weakness.  All other systems reviewed and are negative.   Nutrition: NPO Tolerating Diet: NO Tolerating PT: Await Eval.    DRUG ALLERGIES:   Allergies  Allergen Reactions  . Augmentin [Amoxicillin-Pot Clavulanate] Rash and Other (See Comments)    Can take any other PCNs Has patient had a PCN reaction causing immediate rash, facial/tongue/throat swelling, SOB or lightheadedness with hypotension: Yes Has patient had a PCN reaction causing severe rash involving mucus membranes or skin necrosis: No Has patient had a PCN reaction that required hospitalization: No Has patient had a PCN reaction occurring within the last 10 years: No If all of the above answers are "NO", then may proceed with Cephalosporin use.   . Vicodin [Hydrocodone-Acetaminophen] Nausea And Vomiting, Rash and Nausea Only  . Codeine Sulfate Nausea And Vomiting  . Hydrocodone-Acetaminophen Nausea Only  .  Hydrocodone-Homatropine Other (See Comments)    VITALS:  Blood pressure 118/70, pulse 86, temperature 98.3 F (36.8 C), temperature source Oral, resp. rate 20, height 5\' 8"  (1.727 m), weight 103.4 kg (227 lb 15.3 oz), SpO2 95 %.  PHYSICAL EXAMINATION:   Physical Exam  GENERAL:  37 y.o.-year-old patient lying in bed in no acute distress.  EYES: Pupils equal, round, reactive to light and accommodation. No scleral icterus. Extraocular muscles intact.  HEENT: Head atraumatic, normocephalic. Oropharynx and nasopharynx clear.  NECK:  Supple, no jugular venous distention. No thyroid enlargement, no tenderness.  LUNGS: Normal breath sounds bilaterally, no wheezing, rales, rhonchi. No use of accessory muscles of respiration.  CARDIOVASCULAR: S1, S2 normal. No murmurs, rubs, or gallops.  ABDOMEN: Soft, Tender in LLQ, No rebound, rigidity, nondistended. Bowel sounds present. No organomegaly or mass.  EXTREMITIES: No cyanosis, clubbing or edema b/l.    NEUROLOGIC: Cranial nerves II through XII are intact. No focal Motor or sensory deficits b/l.   PSYCHIATRIC: The patient is alert and oriented x 3.  SKIN: No obvious rash, lesion, or ulcer.    LABORATORY PANEL:   CBC Recent Labs  Lab 04/30/18 0502  WBC 7.3  HGB 11.8*  HCT 34.4*  PLT 236   ------------------------------------------------------------------------------------------------------------------  Chemistries  Recent Labs  Lab 04/29/18 1939 04/30/18 0502  NA 137 138  K 4.2 3.9  CL 99* 104  CO2 26 26  GLUCOSE 95 93  BUN 10 9  CREATININE 0.79 0.74  CALCIUM 9.4 8.2*  AST 31  --   ALT 50  --   ALKPHOS 56  --   BILITOT 0.8  --    ------------------------------------------------------------------------------------------------------------------  Cardiac Enzymes No results for input(s): TROPONINI in the last 168  hours. ------------------------------------------------------------------------------------------------------------------  RADIOLOGY:  No results found.   ASSESSMENT AND PLAN:   37 yo female w/ hx PSVT, GERD, Anxiety, OA who was admitted to the hospital due to abdominal pain noted to have acute sigmoid diverticulitis.  1.  Acute  sigmoid colon diverticulitis: This is the cause of abdominal pain, WBC normalized.  Continue IV fluids because patient still has some pain and nausea.  Continue IV antibiotics, supportive care.  Did not tolerate soft diet yesterday, changed to full liquids, continue full liquids today, continue IV antibiotics, IV pain medicines, surgery also is following.  2. Anxiety/Depression - cont. Xanax, Cymbalta, Zoloft  3. Neuropathy - cont. Neurontin  All the records are reviewed and case discussed with Care Management/Social Worker. Management plans discussed with the patient, family and they are in agreement.  CODE STATUS: Full code  DVT Prophylaxis: Lovenox  TOTAL TIME TAKING CARE OF THIS PATIENT: 25 minutes.   POSSIBLE D/C IN 1-2 DAYS, DEPENDING ON CLINICAL CONDITION.   Katha Hamming M.D on 05/02/2018 at 11:35 AM  Between 7am to 6pm - Pager - [phone]  After 6pm go to [email or web address] - Social research officer, government  [facility] [place] [facility]  Office  [phone]  CC: Primary care physician; Danella Penton, MD

## 2018-05-03 DIAGNOSIS — K5792 Diverticulitis of intestine, part unspecified, without perforation or abscess without bleeding: Secondary | ICD-10-CM

## 2018-05-03 LAB — CBC WITH DIFFERENTIAL/PLATELET
BASOS ABS: 0 10*3/uL (ref 0–0.1)
Basophils Relative: 1 %
EOS PCT: 1 %
Eosinophils Absolute: 0.1 10*3/uL (ref 0–0.7)
HCT: 35.6 % (ref 35.0–47.0)
Hemoglobin: 12.2 g/dL (ref 12.0–16.0)
Lymphocytes Relative: 43 %
Lymphs Abs: 2.4 10*3/uL (ref 1.0–3.6)
MCH: 29.3 pg (ref 26.0–34.0)
MCHC: 34.4 g/dL (ref 32.0–36.0)
MCV: 85.4 fL (ref 80.0–100.0)
MONO ABS: 0.5 10*3/uL (ref 0.2–0.9)
MONOS PCT: 9 %
Neutro Abs: 2.6 10*3/uL (ref 1.4–6.5)
Neutrophils Relative %: 46 %
PLATELETS: 253 10*3/uL (ref 150–440)
RBC: 4.17 MIL/uL (ref 3.80–5.20)
RDW: 13.1 % (ref 11.5–14.5)
WBC: 5.7 10*3/uL (ref 3.6–11.0)

## 2018-05-03 MED ORDER — CIPROFLOXACIN IN D5W 400 MG/200ML IV SOLN
400.0000 mg | Freq: Two times a day (BID) | INTRAVENOUS | Status: DC
  Administered 2018-05-03 – 2018-05-04 (×3): 400 mg via INTRAVENOUS
  Filled 2018-05-03 (×4): qty 200

## 2018-05-03 MED ORDER — CIPROFLOXACIN IN D5W 400 MG/200ML IV SOLN
400.0000 mg | Freq: Two times a day (BID) | INTRAVENOUS | Status: DC
  Filled 2018-05-03: qty 200

## 2018-05-03 MED ORDER — SODIUM CHLORIDE 0.9% FLUSH: 10.0000 mL | INTRAVENOUS | Status: DC | PRN

## 2018-05-03 MED ORDER — SODIUM CHLORIDE 0.9% FLUSH
10.0000 mL | Freq: Two times a day (BID) | INTRAVENOUS | Status: DC
  Administered 2018-05-03 – 2018-05-05 (×2): 10 mL

## 2018-05-03 MED ORDER — METRONIDAZOLE IN NACL 5-0.79 MG/ML-% IV SOLN
500.0000 mg | Freq: Three times a day (TID) | INTRAVENOUS | Status: DC
  Filled 2018-05-03 (×2): qty 100

## 2018-05-03 MED ORDER — METRONIDAZOLE IN NACL 5-0.79 MG/ML-% IV SOLN
500.0000 mg | Freq: Three times a day (TID) | INTRAVENOUS | Status: DC
  Administered 2018-05-03 – 2018-05-05 (×8): 500 mg via INTRAVENOUS
  Filled 2018-05-03 (×10): qty 100

## 2018-05-03 NOTE — Progress Notes (Signed)
CC: Diverticulitis Subjective: Patient unable to tolerate full liquid diet.  She did have some nausea and vomiting.  She complained of some abdominal pain.  No fevers or chills.  Objective: Vital signs in last 24 hours: Temp:  [97.7 F (36.5 C)-98.4 F (36.9 C)] 98.4 F (36.9 C) (06/10 1154) Pulse Rate:  [82-104] 82 (06/10 1154) Resp:  [18-20] 18 (06/10 1154) BP: (99-130)/(66-81) 113/81 (06/10 1154) SpO2:  [95 %-99 %] 96 % (06/10 1154) Weight:  [103.1 kg (227 lb 4.8 oz)] 103.1 kg (227 lb 4.8 oz) (06/10 1154) Last BM Date: 05/02/18  Intake/Output from previous day: 06/09 0701 - 06/10 0700 In: 2174 [P.O.:480; I.V.:1494; IV Piggyback:200] Out: 1700 [Urine:1550; Emesis/NG output:150] Intake/Output this shift: Total I/O In: 306 [I.V.:306] Out: 1100 [Urine:1100]  Physical exam: NAD, awake and alert Abd: soft, TTP LLQ no peritonitis or rebound Ext: no edema and well perfused   Lab Results: CBC  No results for input(s): WBC, HGB, HCT, PLT in the last 72 hours. BMET No results for input(s): NA, K, CL, CO2, GLUCOSE, BUN, CREATININE, CALCIUM in the last 72 hours. PT/INR No results for input(s): LABPROT, INR in the last 72 hours. ABG No results for input(s): PHART, HCO3 in the last 72 hours.  Invalid input(s): PCO2, PO2  Studies/Results: No results found.  Anti-infectives: Anti-infectives (From admission, onward)   Start     Dose/Rate Route Frequency Ordered Stop   05/03/18 0430  ciprofloxacin (CIPRO) IVPB 400 mg  Status:  Discontinued     400 mg 200 mL/hr over 60 Minutes Intravenous Every 12 hours 05/03/18 0425 05/03/18 0427   05/03/18 0430  metroNIDAZOLE (FLAGYL) IVPB 500 mg  Status:  Discontinued     500 mg 100 mL/hr over 60 Minutes Intravenous Every 8 hours 05/03/18 0425 05/03/18 0427   05/03/18 0430  ciprofloxacin (CIPRO) IVPB 400 mg     400 mg 200 mL/hr over 60 Minutes Intravenous Every 12 hours 05/03/18 0427     05/03/18 0430  metroNIDAZOLE (FLAGYL) IVPB 500 mg      500 mg 100 mL/hr over 60 Minutes Intravenous Every 8 hours 05/03/18 0427     04/29/18 2115  meropenem (MERREM) 1 g in sodium chloride 0.9 % 100 mL IVPB  Status:  Discontinued     1 g 200 mL/hr over 30 Minutes Intravenous Every 8 hours 04/29/18 2107 05/03/18 0425   04/29/18 1930  ciprofloxacin (CIPRO) IVPB 400 mg     400 mg 200 mL/hr over 60 Minutes Intravenous  Once 04/29/18 1926 04/29/18 2051   04/29/18 1930  metroNIDAZOLE (FLAGYL) IVPB 500 mg     500 mg 100 mL/hr over 60 Minutes Intravenous  Once 04/29/18 1926 04/29/18 2158      Assessment/Plan: 37 year old female with acute diverticulitis.  Clinically not worsening but not necessarily improving.  We will recheck labs and if her symptoms persist we will rescan her in the morning. Currently no need for emergent surgical intervention at this time.   Sterling Bigiego Raekwon Winkowski, MD, [facility]  05/03/2018

## 2018-05-03 NOTE — Progress Notes (Signed)
[facility] - [place] at [facility]   PATIENT NAME: Brenda Sellers    MR#:  [id number]  DATE OF BIRTH:  1981/02/19  SUBJECTIVE:   Continues to have lower abdominal pain. No improvement. One episode of vomiting. Diarrhea has resolved. Afebrile. Poor appetite.  REVIEW OF SYSTEMS:    Review of Systems  Constitutional: Negative for chills and fever.  HENT: Negative for congestion and tinnitus.   Eyes: Negative for blurred vision and double vision.  Respiratory: Negative for cough, shortness of breath and wheezing.   Cardiovascular: Negative for chest pain, orthopnea and PND.  Gastrointestinal: Positive for abdominal pain (LLQ). Negative for diarrhea, nausea and vomiting.  Genitourinary: Negative for dysuria and hematuria.  Neurological: Negative for dizziness, sensory change and focal weakness.  All other systems reviewed and are negative.  DRUG ALLERGIES:   Allergies  Allergen Reactions  . Augmentin [Amoxicillin-Pot Clavulanate] Rash and Other (See Comments)    Can take any other PCNs Has patient had a PCN reaction causing immediate rash, facial/tongue/throat swelling, SOB or lightheadedness with hypotension: Yes Has patient had a PCN reaction causing severe rash involving mucus membranes or skin necrosis: No Has patient had a PCN reaction that required hospitalization: No Has patient had a PCN reaction occurring within the last 10 years: No If all of the above answers are "NO", then may proceed with Cephalosporin use.   . Vicodin [Hydrocodone-Acetaminophen] Nausea And Vomiting, Rash and Nausea Only  . Codeine Sulfate Nausea And Vomiting  . Hydrocodone-Acetaminophen Nausea Only  . Hydrocodone-Homatropine Other (See Comments)    VITALS:  Blood pressure 113/81, pulse 82, temperature 98.4 F (36.9 C), temperature source Oral, resp. rate 18, height 5\' 8"  (1.727 m), weight 103.1 kg (227 lb 4.8 oz), SpO2 96 %.  PHYSICAL EXAMINATION:   Physical Exam  GENERAL:   37 y.o.-year-old patient lying in bed in no acute distress.  EYES: Pupils equal, round, reactive to light and accommodation. No scleral icterus. Extraocular muscles intact.  HEENT: Head atraumatic, normocephalic. Oropharynx and nasopharynx clear.  NECK:  Supple, no jugular venous distention. No thyroid enlargement, no tenderness.  LUNGS: Normal breath sounds bilaterally, no wheezing, rales, rhonchi. No use of accessory muscles of respiration.  CARDIOVASCULAR: S1, S2 normal. No murmurs, rubs, or gallops.  ABDOMEN: Soft, Tender in LLQ, No rebound, rigidity, nondistended. Bowel sounds present. No organomegaly or mass.  EXTREMITIES: No cyanosis, clubbing or edema b/l.    NEUROLOGIC: Cranial nerves II through XII are intact. No focal Motor or sensory deficits b/l.   PSYCHIATRIC: The patient is alert and oriented x 3.  SKIN: No obvious rash, lesion, or ulcer.    LABORATORY PANEL:   CBC Recent Labs  Lab 05/03/18 1338  WBC 5.7  HGB 12.2  HCT 35.6  PLT 253   ------------------------------------------------------------------------------------------------------------------  Chemistries  Recent Labs  Lab 04/29/18 1939 04/30/18 0502  NA 137 138  K 4.2 3.9  CL 99* 104  CO2 26 26  GLUCOSE 95 93  BUN 10 9  CREATININE 0.79 0.74  CALCIUM 9.4 8.2*  AST 31  --   ALT 50  --   ALKPHOS 56  --   BILITOT 0.8  --    ------------------------------------------------------------------------------------------------------------------  Cardiac Enzymes No results for input(s): TROPONINI in the last 168 hours. ------------------------------------------------------------------------------------------------------------------  RADIOLOGY:  No results found.   ASSESSMENT AND PLAN:   37 yo female w/ hx PSVT, GERD, Anxiety, OA who was admitted to the hospital due to abdominal pain noted to have acute  sigmoid diverticulitis.  1.  Acute  sigmoid colon diverticulitis continues to have significant  pain. WBC normal today. Afebrile. Clear liquids. If no improvement will repeat CT scan of the abdomen in the morning.  2. Anxiety/Depression - cont. Xanax, Cymbalta, Zoloft  3. Neuropathy - cont. Neurontin  All the records are reviewed and case discussed with Care Management/Social Worker. Management plans discussed with the patient, family and they are in agreement.  CODE STATUS: Full code  DVT Prophylaxis: Lovenox  TOTAL TIME TAKING CARE OF THIS PATIENT: 25 minutes.   POSSIBLE D/C IN 1-2 DAYS, DEPENDING ON CLINICAL CONDITION.  Molinda BailiffSrikar R Daelyn Pettaway M.D on 05/03/2018 at 3:14 PM  Between 7am to 6pm - Pager - [phone]  After 6pm go to [email or web address] - Social research officer, governmentpassword EPAS [facility]  [facility] [place] [facility]  Office  [phone]  CC: Primary care physician; Danella PentonMiller, Mark F, MD

## 2018-05-04 ENCOUNTER — Inpatient Hospital Stay: Payer: 59

## 2018-05-04 ENCOUNTER — Encounter: Payer: Self-pay | Admitting: Radiology

## 2018-05-04 LAB — COMPREHENSIVE METABOLIC PANEL
ALK PHOS: 36 U/L — AB (ref 38–126)
ALT: 35 U/L (ref 14–54)
ANION GAP: 7 (ref 5–15)
AST: 36 U/L (ref 15–41)
Albumin: 3.9 g/dL (ref 3.5–5.0)
BUN: 6 mg/dL (ref 6–20)
CALCIUM: 8.4 mg/dL — AB (ref 8.9–10.3)
CHLORIDE: 104 mmol/L (ref 101–111)
CO2: 29 mmol/L (ref 22–32)
CREATININE: 0.67 mg/dL (ref 0.44–1.00)
Glucose, Bld: 108 mg/dL — ABNORMAL HIGH (ref 65–99)
Potassium: 3.8 mmol/L (ref 3.5–5.1)
Sodium: 140 mmol/L (ref 135–145)
Total Bilirubin: 0.4 mg/dL (ref 0.3–1.2)
Total Protein: 6.1 g/dL — ABNORMAL LOW (ref 6.5–8.1)

## 2018-05-04 LAB — CBC
HCT: 34 % — ABNORMAL LOW (ref 35.0–47.0)
Hemoglobin: 11.9 g/dL — ABNORMAL LOW (ref 12.0–16.0)
MCH: 30.1 pg (ref 26.0–34.0)
MCHC: 35.1 g/dL (ref 32.0–36.0)
MCV: 85.6 fL (ref 80.0–100.0)
PLATELETS: 245 10*3/uL (ref 150–440)
RBC: 3.97 MIL/uL (ref 3.80–5.20)
RDW: 13.2 % (ref 11.5–14.5)
WBC: 5.1 10*3/uL (ref 3.6–11.0)

## 2018-05-04 MED ORDER — IOHEXOL 300 MG/ML  SOLN
100.0000 mL | Freq: Once | INTRAMUSCULAR | Status: AC | PRN
  Administered 2018-05-04: 100 mL via INTRAVENOUS

## 2018-05-04 MED ORDER — CIPROFLOXACIN IN D5W 400 MG/200ML IV SOLN
400.0000 mg | Freq: Two times a day (BID) | INTRAVENOUS | Status: DC
  Administered 2018-05-04 – 2018-05-05 (×2): 400 mg via INTRAVENOUS
  Filled 2018-05-04 (×3): qty 200

## 2018-05-04 MED ORDER — HYDROMORPHONE HCL 1 MG/ML IJ SOLN
0.5000 mg | INTRAMUSCULAR | Status: DC | PRN
  Administered 2018-05-04 – 2018-05-06 (×7): 0.5 mg via INTRAVENOUS
  Filled 2018-05-04: qty 1
  Filled 2018-05-04 (×7): qty 0.5

## 2018-05-04 MED ORDER — BOOST / RESOURCE BREEZE PO LIQD CUSTOM
1.0000 | Freq: Three times a day (TID) | ORAL | Status: DC
  Administered 2018-05-04 – 2018-05-05 (×3): 1 via ORAL

## 2018-05-04 MED ORDER — ADULT MULTIVITAMIN W/MINERALS CH
1.0000 | ORAL_TABLET | Freq: Every day | ORAL | Status: DC
  Administered 2018-05-04 – 2018-05-06 (×3): 1 via ORAL
  Filled 2018-05-04 (×3): qty 1

## 2018-05-04 MED ORDER — IOPAMIDOL (ISOVUE-M 300) INJECTION 61%: 15.0000 mL | Freq: Once | INTRAMUSCULAR | Status: DC | PRN

## 2018-05-04 MED ORDER — IOPAMIDOL (ISOVUE-300) INJECTION 61%
15.0000 mL | INTRAVENOUS | Status: AC
  Administered 2018-05-04 (×2): 15 mL via ORAL

## 2018-05-04 NOTE — Progress Notes (Signed)
[facility] [place] at [facility]   PATIENT NAME: Brenda Sellers    MR#:  [id number]  DATE OF BIRTH:  1981/10/20  SUBJECTIVE:   Still has significant pain and tenderness on exam Afebrile Diarrhea No vomiting but nausea tearful  REVIEW OF SYSTEMS:    Review of Systems  Constitutional: Negative for chills and fever.  HENT: Negative for congestion and tinnitus.   Eyes: Negative for blurred vision and double vision.  Respiratory: Negative for cough, shortness of breath and wheezing.   Cardiovascular: Negative for chest pain, orthopnea and PND.  Gastrointestinal: Positive for abdominal pain (LLQ). Negative for diarrhea, nausea and vomiting.  Genitourinary: Negative for dysuria and hematuria.  Neurological: Negative for dizziness, sensory change and focal weakness.  All other systems reviewed and are negative.  DRUG ALLERGIES:   Allergies  Allergen Reactions  . Augmentin [Amoxicillin-Pot Clavulanate] Rash and Other (See Comments)    Can take any other PCNs Has patient had a PCN reaction causing immediate rash, facial/tongue/throat swelling, SOB or lightheadedness with hypotension: Yes Has patient had a PCN reaction causing severe rash involving mucus membranes or skin necrosis: No Has patient had a PCN reaction that required hospitalization: No Has patient had a PCN reaction occurring within the last 10 years: No If all of the above answers are "NO", then may proceed with Cephalosporin use.   . Vicodin [Hydrocodone-Acetaminophen] Nausea And Vomiting, Rash and Nausea Only  . Codeine Sulfate Nausea And Vomiting  . Hydrocodone-Acetaminophen Nausea Only  . Hydrocodone-Homatropine Other (See Comments)    VITALS:  Blood pressure 120/70, pulse (!) 104, temperature 98.6 F (37 C), temperature source Oral, resp. rate 18, height 5\' 8"  (1.727 m), weight 103.1 kg (227 lb 4.8 oz), SpO2 96 %.  PHYSICAL EXAMINATION:   Physical Exam  GENERAL:  37 y.o.-year-old patient  lying in bed in no acute distress.  EYES: Pupils equal, round, reactive to light and accommodation. No scleral icterus. Extraocular muscles intact.  HEENT: Head atraumatic, normocephalic. Oropharynx and nasopharynx clear.  NECK:  Supple, no jugular venous distention. No thyroid enlargement, no tenderness.  LUNGS: Normal breath sounds bilaterally, no wheezing, rales, rhonchi. No use of accessory muscles of respiration.  CARDIOVASCULAR: S1, S2 normal. No murmurs, rubs, or gallops.  ABDOMEN: Soft, Tender in LLQ, No rebound, rigidity, nondistended. Bowel sounds present. No organomegaly or mass.  EXTREMITIES: No cyanosis, clubbing or edema b/l.    NEUROLOGIC: Cranial nerves II through XII are intact. No focal Motor or sensory deficits b/l.   PSYCHIATRIC: The patient is alert and oriented x 3. Tearful  SKIN: No obvious rash, lesion, or ulcer.    LABORATORY PANEL:   CBC Recent Labs  Lab 05/04/18 0425  WBC 5.1  HGB 11.9*  HCT 34.0*  PLT 245   ------------------------------------------------------------------------------------------------------------------  Chemistries  Recent Labs  Lab 05/04/18 0425  NA 140  K 3.8  CL 104  CO2 29  GLUCOSE 108*  BUN 6  CREATININE 0.67  CALCIUM 8.4*  AST 36  ALT 35  ALKPHOS 36*  BILITOT 0.4   ------------------------------------------------------------------------------------------------------------------  Cardiac Enzymes No results for input(s): TROPONINI in the last 168 hours. ------------------------------------------------------------------------------------------------------------------  RADIOLOGY:  No results found.   ASSESSMENT AND PLAN:   37 yo female w/ hx PSVT, GERD, Anxiety, OA who was admitted to the hospital due to abdominal pain noted to have acute sigmoid diverticulitis.  1.  Acute  sigmoid colon diverticulitis continues to have significant pain. WBC normal . Afebrile. Clear liquids. Repeat CT  abd pelvis with IV  contrast  2. Anxiety/Depression - cont. Xanax, Cymbalta, Zoloft  3. Neuropathy - cont. Neurontin  All the records are reviewed and case discussed with Care Management/Social Worker. Management plans discussed with the patient, family and they are in agreement.  CODE STATUS: Full code  DVT Prophylaxis: Lovenox  TOTAL TIME TAKING CARE OF THIS PATIENT: 30 minutes.   POSSIBLE D/C IN 1-2 DAYS, DEPENDING ON CLINICAL CONDITION.  Brenda Sellers M.D on 05/04/2018 at 9:34 AM  Between 7am to 6pm - Pager - [phone]  After 6pm go to [email or web address] - Social research officer, governmentpassword [email or web address] [email or web address]  [facility] [place] [facility]  Office  [phone]  CC: Primary care physician; Danella PentonMiller, Mark F, MD

## 2018-05-04 NOTE — Progress Notes (Addendum)
Initial Nutrition Assessment  DOCUMENTATION CODES:   Obesity unspecified  INTERVENTION:   Boost Breeze po TID, each supplement provides 250 kcal and 9 grams of protein  MVI daily  Pt at high refeeding risk; recommend monitor K, Mg, and P labs   NUTRITION DIAGNOSIS:   Inadequate oral intake related to acute illness(diverticulitis ) as evidenced by meal completion < 25%.  GOAL:   Patient will meet greater than or equal to 90% of their needs  MONITOR:   PO intake, Supplement acceptance, Diet advancement, Labs, Weight trends, I & O's  REASON FOR ASSESSMENT:   NPO/Clear Liquid Diet    ASSESSMENT:   37 y.o. female with a known history of anxiety, arthritis, GERD, recent right total knee replacement due to trauma at work presents to hospital secondary to intense lower abdominal pain associated with nausea and vomiting for 2 days. Pt found to have acute diverticulitis    Met with pt in room today. Pt reports poor appetite and oral intake for several days pta. Pt reports she has only had liquids for the past 7 days. Pt reports continued abdominal pain, nausea and vomiting today. Pt reports that she has been able to eat some [place] ice. Per chart, pt is weight stable. Pt is willing to drink peach Boost Breeze and vanilla Ensure when diet advances. Recommend diverticulitis education prior to discharge; pt does not feel well enough today. Pt reports this is her first flare but per chart, pt noted to have acute diverticulitis flare in 01/2016. Pt s/p repeat CT scan today; per radiologist note "Almost complete resolution of the proximal sigmoid diverticulitis seen on the prior study." RD will order supplements to help pt meet her estimated needs. RD will monitor for the need for nutrition support. Pt likely at high refeeding risk; recommend monitor K, Mg, and P labs for 3 days. 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   Medications reviewed and include: lovenox, zoloft, NaCl '@50ml' /hr,  ciprofloxacin, metronidazole, hydromorphone, zofran, oxycodone   Labs reviewed:   NUTRITION - FOCUSED PHYSICAL EXAM:    Most Recent Value  Orbital Region  No depletion  Upper Arm Region  No depletion  Thoracic and Lumbar Region  No depletion  Buccal Region  No depletion  Temple Region  No depletion  Clavicle Bone Region  No depletion  Clavicle and Acromion Bone Region  No depletion  Scapular Bone Region  No depletion  Dorsal Hand  No depletion  Patellar Region  No depletion  Anterior Thigh Region  No depletion  Posterior Calf Region  No depletion  Edema (RD Assessment)  None  Hair  Reviewed  Eyes  Reviewed  Mouth  Reviewed  Skin  Reviewed  Nails  Reviewed     Diet Order:   Diet Order           Diet clear liquid Room service appropriate? Yes; Fluid consistency: Thin  Diet effective now         EDUCATION NEEDS:   Education needs have been addressed  Skin:  Skin Assessment: Reviewed RN Assessment  Last BM:  6/11- type 7  Height:   Ht Readings from Last 1 Encounters:  04/29/18 '5\' 8"'  (1.727 m)    Weight:   Wt Readings from Last 1 Encounters:  05/03/18 227 lb 4.8 oz (103.1 kg)    Ideal Body Weight:  63.6 kg  BMI:  Body mass index is 34.56 kg/m.  Estimated Nutritional Needs:   Kcal:  1900-2200kcal/day   Protein:  103-114g/day   Fluid:  >1.9L/day   Koleen Distance MS, RD, LDN Pager #- [phone] Office#- [phone] After Hours Pager: [phone]
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
## 2018-05-04 NOTE — Progress Notes (Signed)
CC: Diverticulitis Subjective: She complains of persistent left lower quadrant pain as well as nausea. He came to the room she was crying and very anxious. Have ordered a CT scan I have personally review showing actually improvement in diverticulitis.  No evidence of free air no evidence of an abscess. White count is creatinine is normal.  Her vital signs are stable.  Objective: Vital signs in last 24 hours: Temp:  [98.4 F (36.9 C)-98.6 F (37 C)] 98.4 F (36.9 C) (06/11 1237) Pulse Rate:  [84-104] 84 (06/11 1237) Resp:  [16-18] 16 (06/11 1237) BP: (109-120)/(59-70) 109/63 (06/11 1237) SpO2:  [94 %-96 %] 96 % (06/11 1237) Last BM Date: 05/04/18  Intake/Output from previous day: 06/10 0701 - 06/11 0700 In: 1380 [P.O.:480; I.V.:800; IV Piggyback:100] Out: 2900 [Urine:2900] Intake/Output this shift: Total I/O In: 507.5 [I.V.:407.5; IV Piggyback:100] Out: 2250 [Urine:2250]  Physical exam:  NAD, awake and alert Abd: soft, TTP LLQ, no peritonitis Ext: no edema, and well perfused Psych: tearful and anxious, competent  Lab Results: CBC  Recent Labs    05/03/18 1338 05/04/18 0425  WBC 5.7 5.1  HGB 12.2 11.9*  HCT 35.6 34.0*  PLT 253 245   BMET Recent Labs    05/04/18 0425  NA 140  K 3.8  CL 104  CO2 29  GLUCOSE 108*  BUN 6  CREATININE 0.67  CALCIUM 8.4*   PT/INR No results for input(s): LABPROT, INR in the last 72 hours. ABG No results for input(s): PHART, HCO3 in the last 72 hours.  Invalid input(s): PCO2, PO2  Studies/Results: Ct Abdomen Pelvis W Contrast  Result Date: 05/04/2018 CLINICAL DATA:  Left lower quadrant abdominal pain. Recent diagnosis of sigmoid diverticulitis. EXAM: CT ABDOMEN AND PELVIS WITH CONTRAST TECHNIQUE: Multidetector CT imaging of the abdomen and pelvis was performed using the standard protocol following bolus administration of intravenous contrast. CONTRAST:  100mL OMNIPAQUE IOHEXOL 300 MG/ML  SOLN COMPARISON:  CT scans dated  04/29/2018, 02/01/2016, 04/17/2013 and 10/06/2011 FINDINGS: Lower chest: 4.9 x 2.4 cm pericardial cyst at the right lung base medially this appears slightly more prominent than on the prior recent study but has been present since at least 2012. Hepatobiliary: 3 cm simple appearing cyst in the inferior aspect of the left lobe of the liver. Mild hepatic steatosis. Cholecystectomy. No dilated bile ducts. Pancreas: Unremarkable. No pancreatic ductal dilatation or surrounding inflammatory changes. Spleen: Normal in size without focal abnormality. Adrenals/Urinary Tract: Adrenal glands are unremarkable. Kidneys are normal, without renal calculi, focal lesion, or hydronephrosis. Bladder is unremarkable. Stomach/Bowel: Almost complete resolution of the focal area of diverticulitis in the proximal sigmoid colon seen on the prior study. Slight residual mucosal thickening and pericolonic soft tissue stranding. There are multiple diverticula in the distal descending and proximal sigmoid portions of the colon. The bowel otherwise normal including the terminal ileum and appendix. Vascular/Lymphatic: No significant vascular findings are present. No enlarged abdominal or pelvic lymph nodes. Reproductive: Status post hysterectomy. No adnexal masses. Other: No abdominal wall hernia or abnormality. Tiny amount of free fluid in the pelvic cul de sac. Musculoskeletal: No acute or significant osseous findings. IMPRESSION: 1. Almost complete resolution of the proximal sigmoid diverticulitis seen on the prior study. 2. No other acute abnormalities. 3. Mild hepatic steatosis. 4. Right pericardial cyst appears slightly more prominent than on the prior study. However, this is not felt to be significant. Electronically Signed   By: Francene BoyersJames  Maxwell M.D.   On: 05/04/2018 12:16    Anti-infectives: Anti-infectives (  From admission, onward)   Start     Dose/Rate Route Frequency Ordered Stop   05/04/18 1800  ciprofloxacin (CIPRO) IVPB 400 mg      400 mg 200 mL/hr over 60 Minutes Intravenous Every 12 hours 05/04/18 1124     05/03/18 0430  ciprofloxacin (CIPRO) IVPB 400 mg  Status:  Discontinued     400 mg 200 mL/hr over 60 Minutes Intravenous Every 12 hours 05/03/18 0425 05/03/18 0427   05/03/18 0430  metroNIDAZOLE (FLAGYL) IVPB 500 mg  Status:  Discontinued     500 mg 100 mL/hr over 60 Minutes Intravenous Every 8 hours 05/03/18 0425 05/03/18 0427   05/03/18 0430  ciprofloxacin (CIPRO) IVPB 400 mg  Status:  Discontinued     400 mg 200 mL/hr over 60 Minutes Intravenous Every 12 hours 05/03/18 0427 05/04/18 1124   05/03/18 0430  metroNIDAZOLE (FLAGYL) IVPB 500 mg     500 mg 100 mL/hr over 60 Minutes Intravenous Every 8 hours 05/03/18 0427     04/29/18 2115  meropenem (MERREM) 1 g in sodium chloride 0.9 % 100 mL IVPB  Status:  Discontinued     1 g 200 mL/hr over 30 Minutes Intravenous Every 8 hours 04/29/18 2107 05/03/18 0425   04/29/18 1930  ciprofloxacin (CIPRO) IVPB 400 mg     400 mg 200 mL/hr over 60 Minutes Intravenous  Once 04/29/18 1926 04/29/18 2051   04/29/18 1930  metroNIDAZOLE (FLAGYL) IVPB 500 mg     500 mg 100 mL/hr over 60 Minutes Intravenous  Once 04/29/18 1926 04/29/18 2158      Assessment/Plan: diverticulitis responding to antibiotic therapy repeat CT scan.  Fortunately clinical signs of the patient has not resolve.  Under the significant anxiety may be contributing to the pain. Continue to follow. Need for surgical intervention at this time.  Discussed with the patient detail.  Please note that I spent more than 35 minutes in this encounter with greater than 50% spent in coordination and counseling of her care  Sterling Big, MD, [facility]  05/04/2018

## 2018-05-05 DIAGNOSIS — K5792 Diverticulitis of intestine, part unspecified, without perforation or abscess without bleeding: Secondary | ICD-10-CM

## 2018-05-05 LAB — BASIC METABOLIC PANEL
ANION GAP: 6 (ref 5–15)
BUN: 5 mg/dL — AB (ref 6–20)
CO2: 30 mmol/L (ref 22–32)
Calcium: 8.8 mg/dL — ABNORMAL LOW (ref 8.9–10.3)
Chloride: 106 mmol/L (ref 101–111)
Creatinine, Ser: 0.78 mg/dL (ref 0.44–1.00)
GFR calc Af Amer: >60 mL/min (ref >60)
Glucose, Bld: 96 mg/dL (ref 65–99)
POTASSIUM: 4.1 mmol/L (ref 3.5–5.1)
Sodium: 142 mmol/L (ref 135–145)

## 2018-05-05 LAB — CBC
HCT: 33.5 % — ABNORMAL LOW (ref 35.0–47.0)
Hemoglobin: 12.1 g/dL (ref 12.0–16.0)
MCH: 30.9 pg (ref 26.0–34.0)
MCHC: 36.2 g/dL — ABNORMAL HIGH (ref 32.0–36.0)
MCV: 85.2 fL (ref 80.0–100.0)
PLATELETS: 232 10*3/uL (ref 150–440)
RBC: 3.93 MIL/uL (ref 3.80–5.20)
RDW: 13.3 % (ref 11.5–14.5)
WBC: 5.7 10*3/uL (ref 3.6–11.0)

## 2018-05-05 LAB — MAGNESIUM: Magnesium: 2.1 mg/dL (ref 1.7–2.4)

## 2018-05-05 LAB — PHOSPHORUS: Phosphorus: 4.1 mg/dL (ref 2.5–4.6)

## 2018-05-05 MED ORDER — ENSURE ENLIVE PO LIQD: 237.0000 mL | Freq: Two times a day (BID) | ORAL | Status: DC

## 2018-05-05 MED ORDER — HYDROMORPHONE HCL 1 MG/ML IJ SOLN
INTRAMUSCULAR | Status: AC
  Filled 2018-05-05: qty 1

## 2018-05-05 MED ORDER — CIPROFLOXACIN HCL 500 MG PO TABS
500.0000 mg | ORAL_TABLET | Freq: Two times a day (BID) | ORAL | Status: DC
  Administered 2018-05-05 – 2018-05-06 (×2): 500 mg via ORAL
  Filled 2018-05-05 (×2): qty 1

## 2018-05-05 MED ORDER — METRONIDAZOLE 500 MG PO TABS
500.0000 mg | ORAL_TABLET | Freq: Three times a day (TID) | ORAL | Status: DC
  Administered 2018-05-05 – 2018-05-06 (×2): 500 mg via ORAL
  Filled 2018-05-05 (×4): qty 1

## 2018-05-05 NOTE — Progress Notes (Signed)
[facility] - [place] at [facility]   PATIENT NAME: Brenda Sellers    MR#:  [id number]  DATE OF BIRTH:  September 13, 1981  SUBJECTIVE:   Still has significant pain but some improvement compared to yesterday Afebrile Diarrhea No vomiting but nausea  REVIEW OF SYSTEMS:    Review of Systems  Constitutional: Negative for chills and fever.  HENT: Negative for congestion and tinnitus.   Eyes: Negative for blurred vision and double vision.  Respiratory: Negative for cough, shortness of breath and wheezing.   Cardiovascular: Negative for chest pain, orthopnea and PND.  Gastrointestinal: Positive for abdominal pain (LLQ). Negative for diarrhea, nausea and vomiting.  Genitourinary: Negative for dysuria and hematuria.  Neurological: Negative for dizziness, sensory change and focal weakness.  All other systems reviewed and are negative.  DRUG ALLERGIES:   Allergies  Allergen Reactions  . Augmentin [Amoxicillin-Pot Clavulanate] Rash and Other (See Comments)    Can take any other PCNs Has patient had a PCN reaction causing immediate rash, facial/tongue/throat swelling, SOB or lightheadedness with hypotension: Yes Has patient had a PCN reaction causing severe rash involving mucus membranes or skin necrosis: No Has patient had a PCN reaction that required hospitalization: No Has patient had a PCN reaction occurring within the last 10 years: No If all of the above answers are "NO", then may proceed with Cephalosporin use.   . Vicodin [Hydrocodone-Acetaminophen] Nausea And Vomiting, Rash and Nausea Only  . Codeine Sulfate Nausea And Vomiting  . Hydrocodone-Acetaminophen Nausea Only  . Hydrocodone-Homatropine Other (See Comments)    VITALS:  Blood pressure 103/69, pulse 69, temperature 98.4 F (36.9 C), temperature source Oral, resp. rate 18, height 5\' 8"  (1.727 m), weight 103.1 kg (227 lb 4.8 oz), SpO2 99 %.  PHYSICAL EXAMINATION:   Physical Exam  GENERAL:  37 y.o.-year-old  patient lying in bed in no acute distress.  EYES: Pupils equal, round, reactive to light and accommodation. No scleral icterus. Extraocular muscles intact.  HEENT: Head atraumatic, normocephalic. Oropharynx and nasopharynx clear.  NECK:  Supple, no jugular venous distention. No thyroid enlargement, no tenderness.  LUNGS: Normal breath sounds bilaterally, no wheezing, rales, rhonchi. No use of accessory muscles of respiration.  CARDIOVASCULAR: S1, S2 normal. No murmurs, rubs, or gallops.  ABDOMEN: Soft, Tender in Lower abdomen, No rebound, rigidity, nondistended. Bowel sounds present. No organomegaly or mass.  EXTREMITIES: No cyanosis, clubbing or edema b/l.    NEUROLOGIC: Cranial nerves II through XII are intact. No focal Motor or sensory deficits b/l.  PSYCHIATRIC: The patient is alert and oriented x 3. SKIN: No obvious rash, lesion, or ulcer.    LABORATORY PANEL:   CBC Recent Labs  Lab 05/05/18 0401  WBC 5.7  HGB 12.1  HCT 33.5*  PLT 232   ------------------------------------------------------------------------------------------------------------------  Chemistries  Recent Labs  Lab 05/04/18 0425 05/05/18 0401  NA 140 142  K 3.8 4.1  CL 104 106  CO2 29 30  GLUCOSE 108* 96  BUN 6 5*  CREATININE 0.67 0.78  CALCIUM 8.4* 8.8*  MG  --  2.1  AST 36  --   ALT 35  --   ALKPHOS 36*  --   BILITOT 0.4  --    ------------------------------------------------------------------------------------------------------------------  Cardiac Enzymes No results for input(s): TROPONINI in the last 168 hours. ------------------------------------------------------------------------------------------------------------------  RADIOLOGY:  Ct Abdomen Pelvis W Contrast  Result Date: 05/04/2018 CLINICAL DATA:  Left lower quadrant abdominal pain. Recent diagnosis of sigmoid diverticulitis. EXAM: CT ABDOMEN AND PELVIS WITH CONTRAST TECHNIQUE:  Multidetector CT imaging of the abdomen and pelvis  was performed using the standard protocol following bolus administration of intravenous contrast. CONTRAST:  OMNIPAQUE IOHEXOL 300 MG/ML  SOLN COMPARISON:  CT scans dated 04/29/2018, 02/01/2016, 04/17/2013 and 10/06/2011 FINDINGS: Lower chest: 4.9 x 2.4 cm pericardial cyst at the right lung base medially this appears slightly more prominent than on the prior recent study but has been present since at least 2012. Hepatobiliary: 3 cm simple appearing cyst in the inferior aspect of the left lobe of the liver. Mild hepatic steatosis. Cholecystectomy. No dilated bile ducts. Pancreas: Unremarkable. No pancreatic ductal dilatation or surrounding inflammatory changes. Spleen: Normal in size without focal abnormality. Adrenals/Urinary Tract: Adrenal glands are unremarkable. Kidneys are normal, without renal calculi, focal lesion, or hydronephrosis. Bladder is unremarkable. Stomach/Bowel: Almost complete resolution of the focal area of diverticulitis in the proximal sigmoid colon seen on the prior study. Slight residual mucosal thickening and pericolonic soft tissue stranding. There are multiple diverticula in the distal descending and proximal sigmoid portions of the colon. The bowel otherwise normal including the terminal ileum and appendix. Vascular/Lymphatic: No significant vascular findings are present. No enlarged abdominal or pelvic lymph nodes. Reproductive: Status post hysterectomy. No adnexal masses. Other: No abdominal wall hernia or abnormality. Tiny amount of free fluid in the pelvic cul de sac. Musculoskeletal: No acute or significant osseous findings. IMPRESSION: 1. Almost complete resolution of the proximal sigmoid diverticulitis seen on the prior study. 2. No other acute abnormalities. 3. Mild hepatic steatosis. 4. Right pericardial cyst appears slightly more prominent than on the prior study. However, this is not felt to be significant. Electronically Signed   By: Francene Boyers M.D.   On: 05/04/2018  12:16     ASSESSMENT AND PLAN:   37 yo female w/ hx PSVT, GERD, Anxiety, OA who was admitted to the hospital due to abdominal pain noted to have acute sigmoid diverticulitis.  1.  Acute  sigmoid colon diverticulitis continues to have significant pain. WBC normal . Afebrile. Clear liquids--> advance to soft diet Repeat CT abd pelvis with IV contrast - resolving diverticulitis  2. Anxiety/Depression - cont. Xanax, Cymbalta, Zoloft  3. Neuropathy - cont. Neurontin  Likely d/c in AM  All the records are reviewed and case discussed with Care Management/Social Worker. Management plans discussed with the patient, family and they are in agreement.  CODE STATUS: Full code  DVT Prophylaxis: Lovenox  TOTAL TIME TAKING CARE OF THIS PATIENT: 30 minutes.   POSSIBLE D/C IN 1-2 DAYS, DEPENDING ON CLINICAL CONDITION.  Orie Fisherman M.D on 05/05/2018 at 12:15 PM  Between 7am to 6pm - Pager - [phone]  After 6pm go to [email or web address] - Social research officer, government  [facility] [place] [facility]  Office  [phone]  CC: Primary care physician; Danella Penton, MD

## 2018-05-05 NOTE — Progress Notes (Signed)
SURGICAL PROGRESS NOTE    Brenda Sellers  ZOX:[id number] DOB: [birth year] DOA: 04/29/2018  History: The patient is a 37 year-old female with a history of anxiety/depression and neuropathy who is admitted with acute sigmoid diverticulitis.  Subjective: The patient reports feeling slightly better than yesterday with slightly less pain. She continues to have loose stool which she states is normal for her. She is passing flatus. She states she has some nausea after eating but that it is improving and she denies vomiting. She denies fever and chills. She denies chest pain and shortness of breath.  Objective: Vitals:   05/04/18 2226 05/05/18 0349 05/05/18 0727 05/05/18 1337  BP: 102/63 (!) 105/51 103/69 (!) 109/51  Pulse: 77 72 69 80  Resp: 18   20  Temp: 98.3 F (36.8 C) 98.6 F (37 C) 98.4 F (36.9 C) 98.1 F (36.7 C)  TempSrc: Oral Oral Oral   SpO2: 97% 100% 99%   Weight:      Height:        Examination:  General exam: Appears calm and comfortable  Respiratory system: Respiratory effort normal. Cardiovascular system: RRR. No pedal edema. Gastrointestinal system: Abdomen is nondistended, soft and mildly tender in LLQ. No organomegaly or masses felt. Normal bowel sounds heard. Central nervous system: Alert and oriented. No focal neurological deficits. Extremities: No calf tenderness or swelling. Skin: No rashes, lesions or ulcers Psychiatry: Judgement and insight appear normal. Mood & affect appropriate.    Data Reviewed: I have personally reviewed following labs and imaging studies  CBC: Recent Labs  Lab 04/29/18 1939 04/30/18 0502 05/03/18 1338 05/04/18 0425 05/05/18 0401  WBC 14.6* 7.3 5.7 5.1 5.7  NEUTROABS 9.7*  --  2.6  --   --   HGB 14.1 11.8* 12.2 11.9* 12.1  HCT 41.3 34.4* 35.6 34.0* 33.5*  MCV 86.5 86.5 85.4 85.6 85.2  PLT 309 236 253 245 232   Basic Metabolic Panel: Recent Labs  Lab 04/29/18 1939 04/30/18 0502 05/04/18 0425 05/05/18 0401  NA 137 138  140 142  K 4.2 3.9 3.8 4.1  CL 99* 104 104 106  CO2 26 26 29 30   GLUCOSE 95 93 108* 96  BUN 10 9 6  5*  CREATININE 0.79 0.74 0.67 0.78  CALCIUM 9.4 8.2* 8.4* 8.8*  MG  --   --   --  2.1  PHOS  --   --   --  4.1   Liver Function Tests: Recent Labs  Lab 04/29/18 1939 05/04/18 0425  AST 31 36  ALT 50 35  ALKPHOS 56 36*  BILITOT 0.8 0.4  PROT 8.3* 6.1*  ALBUMIN 4.9 3.9    Radiology Studies: Ct Abdomen Pelvis W Contrast  Result Date: 05/04/2018 CLINICAL DATA:  Left lower quadrant abdominal pain. Recent diagnosis of sigmoid diverticulitis. EXAM: CT ABDOMEN AND PELVIS WITH CONTRAST TECHNIQUE: Multidetector CT imaging of the abdomen and pelvis was performed using the standard protocol following bolus administration of intravenous contrast. CONTRAST:  OMNIPAQUE IOHEXOL 300 MG/ML  SOLN COMPARISON:  CT scans dated 04/29/2018, 02/01/2016, 04/17/2013 and 10/06/2011 FINDINGS: Lower chest: 4.9 x 2.4 cm pericardial cyst at the right lung base medially this appears slightly more prominent than on the prior recent study but has been present since at least 2012. Hepatobiliary: 3 cm simple appearing cyst in the inferior aspect of the left lobe of the liver. Mild hepatic steatosis. Cholecystectomy. No dilated bile ducts. Pancreas: Unremarkable. No pancreatic ductal dilatation or surrounding inflammatory changes. Spleen: Normal in  size without focal abnormality. Adrenals/Urinary Tract: Adrenal glands are unremarkable. Kidneys are normal, without renal calculi, focal lesion, or hydronephrosis. Bladder is unremarkable. Stomach/Bowel: Almost complete resolution of the focal area of diverticulitis in the proximal sigmoid colon seen on the prior study. Slight residual mucosal thickening and pericolonic soft tissue stranding. There are multiple diverticula in the distal descending and proximal sigmoid portions of the colon. The bowel otherwise normal including the terminal ileum and appendix. Vascular/Lymphatic:  No significant vascular findings are present. No enlarged abdominal or pelvic lymph nodes. Reproductive: Status post hysterectomy. No adnexal masses. Other: No abdominal wall hernia or abnormality. Tiny amount of free fluid in the pelvic cul de sac. Musculoskeletal: No acute or significant osseous findings. IMPRESSION: 1. Almost complete resolution of the proximal sigmoid diverticulitis seen on the prior study. 2. No other acute abnormalities. 3. Mild hepatic steatosis. 4. Right pericardial cyst appears slightly more prominent than on the prior study. However, this is not felt to be significant. Electronically Signed   By: Francene BoyersJames  Maxwell M.D.   On: 05/04/2018 12:16    Scheduled Meds: . ciprofloxacin  500 mg Oral BID  . DULoxetine  60 mg Oral Daily  . enoxaparin (LOVENOX) injection  40 mg Subcutaneous Q24H  . feeding supplement (ENSURE ENLIVE)  237 mL Oral BID BM  . gabapentin  300 mg Oral BID  . HYDROmorphone      . metroNIDAZOLE  500 mg Oral Q8H  . multivitamin with minerals  1 tablet Oral Daily  . sertraline  100 mg Oral Daily  . sodium chloride flush  10-40 mL Intracatheter Q12H    Assessment/Plan: The patient is a 37 year-old female with a history of anxiety/depression and neuropathy who is admitted with acute sigmoid diverticulitis. Her pain is slowly improving. She is tolerating a diet and her leukocytosis has resolved. Repeat CT scan yesterday shows almost complete resolution of acute diverticulitis.  - Advance diet to soft/low residue - Continue antibiotic therapy - Continue medical management of anxiety/depression and neuropathy - Continue DVT prophylaxis and ambulation - Likely discharge home tomorrow - Low residue diet for 6 weeks followed by high fiber diet - PCP or GI as outpatient for evaluation of chronic diarrhea - Follow up in outpatient surgical clinic   Aviva W  Kaplin, DO

## 2018-05-05 NOTE — Progress Notes (Signed)
Per MD okay for RN to place soft diet order.

## 2018-05-06 MED ORDER — CIPROFLOXACIN HCL 500 MG PO TABS: 500.0000 mg | ORAL_TABLET | Freq: Two times a day (BID) | ORAL | 0 refills | Status: DC

## 2018-05-06 MED ORDER — METRONIDAZOLE 500 MG PO TABS: 500.0000 mg | ORAL_TABLET | Freq: Three times a day (TID) | ORAL | 0 refills | Status: DC

## 2018-05-06 MED ORDER — IBUPROFEN 400 MG PO TABS: 400.0000 mg | ORAL_TABLET | Freq: Four times a day (QID) | ORAL | 0 refills | Status: DC | PRN

## 2018-05-06 NOTE — Progress Notes (Signed)
CC: Acute diverticulitis Subjective: Patient showing continuous improvement on a daily basis but still has left lower quadrant pain no further nausea or vomiting no fevers or chills  Objective: Vital signs in last 24 hours: Temp:  [98.1 F (36.7 C)-99.2 F (37.3 C)] 98.5 F (36.9 C) (06/13 0453) Pulse Rate:  [77-80] 77 (06/13 0453) Resp:  [20] 20 (06/13 0453) BP: (93-118)/(46-66) 93/46 (06/13 0453) SpO2:  [95 %-97 %] 95 % (06/13 0453) Last BM Date: 05/05/18  Intake/Output from previous day: 06/12 0701 - 06/13 0700 In: 1669.2 [P.O.:240; I.V.:1329.2; IV Piggyback:100] Out: 800 [Urine:800] Intake/Output this shift: No intake/output data recorded.  Physical exam:  Vital signs stable and reviewed.  Abdomen is soft minimally tender in the left lower quadrant much improved over my last visit.  Nontender calves no icterus no jaundice  Lab Results: CBC  Recent Labs    05/04/18 0425 05/05/18 0401  WBC 5.1 5.7  HGB 11.9* 12.1  HCT 34.0* 33.5*  PLT 245 232   BMET Recent Labs    05/04/18 0425 05/05/18 0401  NA 140 142  K 3.8 4.1  CL 104 106  CO2 29 30  GLUCOSE 108* 96  BUN 6 5*  CREATININE 0.67 0.78  CALCIUM 8.4* 8.8*   PT/INR No results for input(s): LABPROT, INR in the last 72 hours. ABG No results for input(s): PHART, HCO3 in the last 72 hours.  Invalid input(s): PCO2, PO2  Studies/Results: Ct Abdomen Pelvis W Contrast  Result Date: 05/04/2018 CLINICAL DATA:  Left lower quadrant abdominal pain. Recent diagnosis of sigmoid diverticulitis. EXAM: CT ABDOMEN AND PELVIS WITH CONTRAST TECHNIQUE: Multidetector CT imaging of the abdomen and pelvis was performed using the standard protocol following bolus administration of intravenous contrast. CONTRAST:  OMNIPAQUE IOHEXOL 300 MG/ML  SOLN COMPARISON:  CT scans dated 04/29/2018, 02/01/2016, 04/17/2013 and 10/06/2011 FINDINGS: Lower chest: 4.9 x 2.4 cm pericardial cyst at the right lung base medially this appears  slightly more prominent than on the prior recent study but has been present since at least 2012. Hepatobiliary: 3 cm simple appearing cyst in the inferior aspect of the left lobe of the liver. Mild hepatic steatosis. Cholecystectomy. No dilated bile ducts. Pancreas: Unremarkable. No pancreatic ductal dilatation or surrounding inflammatory changes. Spleen: Normal in size without focal abnormality. Adrenals/Urinary Tract: Adrenal glands are unremarkable. Kidneys are normal, without renal calculi, focal lesion, or hydronephrosis. Bladder is unremarkable. Stomach/Bowel: Almost complete resolution of the focal area of diverticulitis in the proximal sigmoid colon seen on the prior study. Slight residual mucosal thickening and pericolonic soft tissue stranding. There are multiple diverticula in the distal descending and proximal sigmoid portions of the colon. The bowel otherwise normal including the terminal ileum and appendix. Vascular/Lymphatic: No significant vascular findings are present. No enlarged abdominal or pelvic lymph nodes. Reproductive: Status post hysterectomy. No adnexal masses. Other: No abdominal wall hernia or abnormality. Tiny amount of free fluid in the pelvic cul de sac. Musculoskeletal: No acute or significant osseous findings. IMPRESSION: 1. Almost complete resolution of the proximal sigmoid diverticulitis seen on the prior study. 2. No other acute abnormalities. 3. Mild hepatic steatosis. 4. Right pericardial cyst appears slightly more prominent than on the prior study. However, this is not felt to be significant. Electronically Signed   By: Francene Boyers M.D.   On: 05/04/2018 12:16    Anti-infectives: Anti-infectives (From admission, onward)   Start     Dose/Rate Route Frequency Ordered Stop   05/06/18 0000  ciprofloxacin (CIPRO) 500  MG tablet     500 mg Oral 2 times daily 05/06/18 0831     05/06/18 0000  metroNIDAZOLE (FLAGYL) 500 MG tablet     500 mg Oral 3 times daily 05/06/18 0831      05/05/18 2000  ciprofloxacin (CIPRO) tablet 500 mg     500 mg Oral 2 times daily 05/05/18 1408     05/05/18 2000  metroNIDAZOLE (FLAGYL) tablet 500 mg     500 mg Oral Every 8 hours 05/05/18 1408     05/04/18 1800  ciprofloxacin (CIPRO) IVPB 400 mg  Status:  Discontinued     400 mg 200 mL/hr over 60 Minutes Intravenous Every 12 hours 05/04/18 1124 05/05/18 1408   05/03/18 0430  ciprofloxacin (CIPRO) IVPB 400 mg  Status:  Discontinued     400 mg 200 mL/hr over 60 Minutes Intravenous Every 12 hours 05/03/18 0425 05/03/18 0427   05/03/18 0430  metroNIDAZOLE (FLAGYL) IVPB 500 mg  Status:  Discontinued     500 mg 100 mL/hr over 60 Minutes Intravenous Every 8 hours 05/03/18 0425 05/03/18 0427   05/03/18 0430  ciprofloxacin (CIPRO) IVPB 400 mg  Status:  Discontinued     400 mg 200 mL/hr over 60 Minutes Intravenous Every 12 hours 05/03/18 0427 05/04/18 1124   05/03/18 0430  metroNIDAZOLE (FLAGYL) IVPB 500 mg  Status:  Discontinued     500 mg 100 mL/hr over 60 Minutes Intravenous Every 8 hours 05/03/18 0427 05/05/18 1408   04/29/18 2115  meropenem (MERREM) 1 g in sodium chloride 0.9 % 100 mL IVPB  Status:  Discontinued     1 g 200 mL/hr over 30 Minutes Intravenous Every 8 hours 04/29/18 2107 05/03/18 0425   04/29/18 1930  ciprofloxacin (CIPRO) IVPB 400 mg     400 mg 200 mL/hr over 60 Minutes Intravenous  Once 04/29/18 1926 04/29/18 2051   04/29/18 1930  metroNIDAZOLE (FLAGYL) IVPB 500 mg     500 mg 100 mL/hr over 60 Minutes Intravenous  Once 04/29/18 1926 04/29/18 2158      Assessment/Plan:  White blood cell count is normal.  Abdomen is much improved and can likely be switched to oral antibiotics and discharged at any time for follow-up in 2 weeks.  Lattie Hawichard E Sussan Meter, MD, FACS  05/06/2018

## 2018-05-07 ENCOUNTER — Other Ambulatory Visit: Payer: Self-pay | Admitting: *Deleted

## 2018-05-07 NOTE — Patient Outreach (Addendum)
[facility]) Care Management  05/07/2018  Brenda Sellers [birth year] [id number]   Subjective: Telephone call to patient's home / mobile number, spoke with patient, and HIPAA verified.  Discussed [facility] Transition of care follow up, patient voiced understanding, and is in agreement to follow up.  Patient states she is doing okay, still hurting, pain level at 6, and is planning to call MD regarding pain level.  States she is taking pan medications as prescribed and will follow up with MD as discussed with this RNCM.   Patient states she has the following appointments: with primary MD on 05/13/18,  surgeon on 06/03/18, and gastroenterologist on 06/10/18.  Patient states she is able to manage self care and has assistance as needed with activities of daily living / home management.  Patient voices understanding of medical diagnosis and treatment plan.  States she is accessing the following [facility] benefits: outpatient pharmacy, hospital indemnity, and will call [employer] today to follow up on family medical leave act (FMLA) process.   States her FMLA claim was denied and she is in the process of appealing.   Patient requested this RNCM email  ([email or web address]) her contact information for [employer] and [employer] so she can following on filing hospital indemnity plan claim if appropriate.  Patient states she does not have any education material, transition of care, care coordination, disease management, disease monitoring, transportation, community resource, or pharmacy needs at this time.  States she is very appreciative of the follow up and is in agreement to receive [facility] information.      Objective:  Per KPN (Knowledge Performance Now, point of care tool) and chart review,  patient hospitalized 04/29/18 -05/06/18 for acute Acute  sigmoid colon diverticulitis.   Patient hospitalized 12/07/17 -12/09/17. Status post RIGHT TOTAL KNEE ARTHROPLASTY on  12/07/17.  Patient also has a history of Pericardial cyst, headache, and PSVT (paroxysmal supraventricular tachycardia).   [facility] transition of care follow up completed on 12/11/17.      Assessment: Received [facility] Transition of care referral on 05/04/18.   Transition of care follow up completed, no care management needs, and will proceed with case closure.      Plan: RNCM will send patient successful outreach letter, [facility] pamph[facility], and magnet. RNCM will complete case closure due to follow up completed / no care management needs.  RNCM will send contact information for [employer] and [employer] to patient via secure email per her request.      Javar Eshbach H. Gardiner Barefootooper RN, BSN, CCM [facility] Care Management [facility] Telephonic CM Phone: [phone] Fax: [phone]

## 2018-05-08 NOTE — Discharge Summary (Signed)
[facility] [place] [facility]   PATIENT NAME: Brenda Sellers    MR#:  [id number]  DATE OF BIRTH:  Mar 04, 1981  DATE OF ADMISSION:  04/29/2018   ADMITTING PHYSICIAN: Enid Baasadhika Kalisetti, MD  DATE OF DISCHARGE: 05/06/2018 12:46 PM  PRIMARY CARE PHYSICIAN: Danella PentonMiller, Mark F, MD   ADMISSION DIAGNOSIS:  Diverticulitis [K57.92] DISCHARGE DIAGNOSIS:  Active Problems:   Acute diverticulitis   Diverticulitis  SECONDARY DIAGNOSIS:   Past Medical History:  Diagnosis Date  . Anxiety   . Arthritis   . Complication of anesthesia   . GERD (gastroesophageal reflux disease)    OCC  . Headache   . Nerve pain   . Pericardial cyst   . PONV (postoperative nausea and vomiting)    Last surgery 06/2014- nausea not vomitting was pre medicated.  Marland Kitchen. PSVT (paroxysmal supraventricular tachycardia) [facility](HCC)    HOSPITAL COURSE:  37 yo female w/ hx PSVT, GERD, Anxiety, OA who was admitted to the hospital due to abdominal pain noted to have acute sigmoid diverticulitis.  1.  Acute  sigmoid colon diverticulitis continues to have some 5-6/10 pain. WBC normal . Afebrile. Tolerating diet.  Repeat CT abd pelvis with IV contrast shows resolving diverticulitis - will set up outpt GI & surgical f/up along with PCP - for now, Ive recommended to try ibuprofen and avoid narcotics if possible. She's in agreement.  2. Anxiety/Depression - cont. Xanax, Cymbalta, Zoloft  3. Neuropathy - cont. Neurontin  4. Hypotension: Asymptomatic. she reports her BP running low at home also, I recommend avoid narcotics as it can worsen her BP.  DISCHARGE CONDITIONS:  stable CONSULTS OBTAINED:  Treatment Team:  Lattie Hawooper, Richard E, MD DRUG ALLERGIES:   Allergies  Allergen Reactions  . Augmentin [Amoxicillin-Pot Clavulanate] Rash and Other (See Comments)    Can take any other PCNs Has patient had a PCN reaction causing immediate rash, facial/tongue/throat swelling, SOB or lightheadedness with hypotension:  Yes Has patient had a PCN reaction causing severe rash involving mucus membranes or skin necrosis: No Has patient had a PCN reaction that required hospitalization: No Has patient had a PCN reaction occurring within the last 10 years: No If all of the above answers are "NO", then may proceed with Cephalosporin use.   . Vicodin [Hydrocodone-Acetaminophen] Nausea And Vomiting, Rash and Nausea Only  . Codeine Sulfate Nausea And Vomiting  . Hydrocodone-Acetaminophen Nausea Only  . Hydrocodone-Homatropine Other (See Comments)   DISCHARGE MEDICATIONS:   Allergies as of 05/06/2018      Reactions   Augmentin [amoxicillin-pot Clavulanate] Rash, Other (See Comments)   Can take any other PCNs Has patient had a PCN reaction causing immediate rash, facial/tongue/throat swelling, SOB or lightheadedness with hypotension: Yes Has patient had a PCN reaction causing severe rash involving mucus membranes or skin necrosis: No Has patient had a PCN reaction that required hospitalization: No Has patient had a PCN reaction occurring within the last 10 years: No If all of the above answers are "NO", then may proceed with Cephalosporin use.   Vicodin [hydrocodone-acetaminophen] Nausea And Vomiting, Rash, Nausea Only   Codeine Sulfate Nausea And Vomiting   Hydrocodone-acetaminophen Nausea Only   Hydrocodone-homatropine Other (See Comments)      Medication List    STOP taking these medications   aspirin EC 325 MG tablet   celecoxib 200 MG capsule Commonly known as:  CELEBREX   oxyCODONE-acetaminophen 5-325 MG tablet Commonly known as:  ROXICET   traMADol 50 MG tablet Commonly known as:  ULTRAM     TAKE these medications   ALPRAZolam 0.5 MG tablet Commonly known as:  XANAX Take 0.5 mg by mouth 2 (two) times daily as needed for anxiety.   ciprofloxacin 500 MG tablet Commonly known as:  CIPRO Take 1 tablet (500 mg total) by mouth 2 (two) times daily.   DULoxetine 60 MG capsule Commonly known as:   CYMBALTA Take 60 mg by mouth daily.   gabapentin 300 MG capsule Commonly known as:  NEURONTIN Take 300 mg by mouth 2 (two) times daily.   ibuprofen 400 MG tablet Commonly known as:  ADVIL,MOTRIN Take 1 tablet (400 mg total) by mouth every 6 (six) hours as needed.   methocarbamol 750 MG tablet Commonly known as:  ROBAXIN Take 1 tablet (750 mg total) by mouth every 6 (six) hours as needed for muscle spasms.   metroNIDAZOLE 500 MG tablet Commonly known as:  FLAGYL Take 1 tablet (500 mg total) by mouth 3 (three) times daily.   sertraline 100 MG tablet Commonly known as:  ZOLOFT Take 100 mg by mouth daily.   temazepam 30 MG capsule Commonly known as:  RESTORIL Take 30 mg by mouth at bedtime as needed for sleep.        DISCHARGE INSTRUCTIONS:   DIET:  Regular diet DISCHARGE CONDITION:  Good ACTIVITY:  Activity as tolerated OXYGEN:  Home Oxygen: No.  Oxygen Delivery: room air DISCHARGE LOCATION:  home   If you experience worsening of your admission symptoms, develop shortness of breath, life threatening emergency, suicidal or homicidal thoughts you must seek medical attention immediately by calling 911 or calling your MD immediately  if symptoms less severe.  You Must read complete instructions/literature along with all the possible adverse reactions/side effects for all the Medicines you take and that have been prescribed to you. Take any new Medicines after you have completely understood and accpet all the possible adverse reactions/side effects.   Please note  You were cared for by a hospitalist during your hospital stay. If you have any questions about your discharge medications or the care you received while you were in the hospital after you are discharged, you can call the unit and asked to speak with the hospitalist on call if the hospitalist that took care of you is not available. Once you are discharged, your primary care physician will handle any further  medical issues. Please note that NO REFILLS for any discharge medications will be authorized once you are discharged, as it is imperative that you return to your primary care physician (or establish a relationship with a primary care physician if you do not have one) for your aftercare needs so that they can reassess your need for medications and monitor your lab values.    On the day of Discharge:  VITAL SIGNS:  Blood pressure (!) 93/46, pulse 77, temperature 98.5 F (36.9 C), temperature source Oral, resp. rate 20, height 5\' 8"  (1.727 m), weight 103.1 kg (227 lb 4.8 oz), SpO2 95 %. PHYSICAL EXAMINATION:  GENERAL:  37 y.o.-year-old patient lying in the bed with no acute distress.  EYES: Pupils equal, round, reactive to light and accommodation. No scleral icterus. Extraocular muscles intact.  HEENT: Head atraumatic, normocephalic. Oropharynx and nasopharynx clear.  NECK:  Supple, no jugular venous distention. No thyroid enlargement, no tenderness.  LUNGS: Normal breath sounds bilaterally, no wheezing, rales,rhonchi or crepitation. No use of accessory muscles of respiration.  CARDIOVASCULAR: S1, S2 normal. No murmurs, rubs, or gallops.  ABDOMEN: Soft,  non-tender, non-distended. Bowel sounds present. No organomegaly or mass.  EXTREMITIES: No pedal edema, cyanosis, or clubbing.  NEUROLOGIC: Cranial nerves II through XII are intact. Muscle strength 5/5 in all extremities. Sensation intact. Gait not checked.  PSYCHIATRIC: The patient is alert and oriented x 3.  SKIN: No obvious rash, lesion, or ulcer.  DATA REVIEW:   CBC Recent Labs  Lab 05/05/18 0401  WBC 5.7  HGB 12.1  HCT 33.5*  PLT 232    Chemistries  Recent Labs  Lab 05/04/18 0425 05/05/18 0401  NA 140 142  K 3.8 4.1  CL 104 106  CO2 29 30  GLUCOSE 108* 96  BUN 6 5*  CREATININE 0.67 0.78  CALCIUM 8.4* 8.8*  MG  --  2.1  AST 36  --   ALT 35  --   ALKPHOS 36*  --   BILITOT 0.4  --      Follow-up Information     Danella Penton, MD. Go on 05/13/2018.   Specialty:  Internal Medicine Why:  @9 :45a Contact information: [address] [place] [address] [phone]        Leafy Ro, MD. Go on 06/02/2018.   Specialty:  General Surgery Why:  @1 :30p Contact information: [address] [place] [address] [phone]        Midge Minium, MD. Go on 06/10/2018.   Specialty:  Gastroenterology Why:  @1 :30 Contact information: [address] [place]  [place] [address] [phone]            Management plans discussed with the patient, family and they are in agreement.  CODE STATUS: Prior   TOTAL TIME TAKING CARE OF THIS PATIENT: 45 minutes.    Delfino Lovett M.D on 05/08/2018 at 1:28 PM  Between 7am to 6pm - Pager - [phone]  After 6pm go to [email or web address] - Social research officer, government  [facility]  Office  [phone]  CC: Primary care physician; Danella Penton, MD   Note: This dictation was prepared with Dragon dictation along with smaller phrase technology. Any transcriptional errors that result from this process are unintentional.

## 2018-05-13 DIAGNOSIS — K5792 Diverticulitis of intestine, part unspecified, without perforation or abscess without bleeding: Secondary | ICD-10-CM | POA: Diagnosis not present

## 2018-06-02 ENCOUNTER — Inpatient Hospital Stay: Payer: Self-pay | Admitting: Surgery

## 2018-06-03 ENCOUNTER — Telehealth: Payer: Self-pay | Admitting: General Practice

## 2018-06-03 NOTE — Telephone Encounter (Signed)
Left a message for the patient to call the office. Patient no showed appointment with our office on 06/02/18. Please r/s if patient calls back.

## 2018-06-10 ENCOUNTER — Other Ambulatory Visit
Admission: RE | Admit: 2018-06-10 | Discharge: 2018-06-10 | Disposition: A | Discharge status: Home / Self Care | Payer: 59 | Source: Ambulatory Visit | Attending: Gastroenterology | Admitting: Gastroenterology

## 2018-06-10 ENCOUNTER — Ambulatory Visit (INDEPENDENT_AMBULATORY_CARE_PROVIDER_SITE_OTHER): Payer: 59 | Admitting: Gastroenterology

## 2018-06-10 ENCOUNTER — Encounter: Payer: Self-pay | Admitting: Gastroenterology

## 2018-06-10 VITALS — BP 113/60 | HR 90 | Ht 68.5 in | Wt 224.0 lb

## 2018-06-10 DIAGNOSIS — K5732 Diverticulitis of large intestine without perforation or abscess without bleeding: Secondary | ICD-10-CM | POA: Diagnosis not present

## 2018-06-10 DIAGNOSIS — R197 Diarrhea, unspecified: Secondary | ICD-10-CM

## 2018-06-10 LAB — GASTROINTESTINAL PANEL BY PCR, STOOL (REPLACES STOOL CULTURE)
ASTROVIRUS: NOT DETECTED
Adenovirus F40/41: NOT DETECTED
CAMPYLOBACTER SPECIES: NOT DETECTED
Cryptosporidium: NOT DETECTED
Cyclospora cayetanensis: NOT DETECTED
ENTEROTOXIGENIC E COLI (ETEC): NOT DETECTED
Entamoeba histolytica: NOT DETECTED
Enteroaggregative E coli (EAEC): NOT DETECTED
Enteropathogenic E coli (EPEC): NOT DETECTED
Giardia lamblia: NOT DETECTED
NOROVIRUS GI/GII: NOT DETECTED
PLESIMONAS SHIGELLOIDES: NOT DETECTED
ROTAVIRUS A: NOT DETECTED
SAPOVIRUS (I, II, IV, AND V): NOT DETECTED
Salmonella species: NOT DETECTED
Shiga like toxin producing E coli (STEC): NOT DETECTED
Shigella/Enteroinvasive E coli (EIEC): NOT DETECTED
Vibrio cholerae: NOT DETECTED
Vibrio species: NOT DETECTED
Yersinia enterocolitica: NOT DETECTED

## 2018-06-10 LAB — C DIFFICILE QUICK SCREEN W PCR REFLEX
C Diff antigen: NEGATIVE
C Diff interpretation: NOT DETECTED
C Diff toxin: NEGATIVE

## 2018-06-10 NOTE — Progress Notes (Signed)
Gastroenterology Consultation  Referring Provider:     Danella Penton, MD Primary Care Physician:  Danella Penton, MD Primary Gastroenterologist:  Dr. Servando Snare     Reason for Consultation:     Diverticulitis        HPI:   Brenda Sellers is a 37 y.o. y/o female referred for consultation & management of Diverticulitis by Dr. Hyacinth Meeker, Hardin Negus, MD.  This patient comes in today with a history of an admission to the hospital with a CT scan showing diverticulitis.  The patient was seen and treated by the hospitalist and surgery with a repeat CT scan showing improvement in the diverticulitis. Upon discharge from the hospital the patient was recommended to follow up with GI.  The patient reports that she continues to have diarrhea approximately 10 times a day but sleeps throughout the entire night.  She reports the diarrhea is foul-smelling and yellow in color.  The patient is also reporting some continued left-sided abdominal pain although much better than it was in the past.  There is no report of any fevers chills nausea or vomiting.  The patient states she is not eating just because she does not have an appetite.  There is no report of any dysphasia.    Past Medical History:  Diagnosis Date  . Acute diverticulitis 04/29/2018  . Anxiety   . Arthritis   . Complication of anesthesia   . Diverticulitis   . GERD (gastroesophageal reflux disease)    OCC  . Headache   . Hypotension 11/10/2013  . 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 Nerve pain   . Paroxysmal supraventricular tachycardia (HCC) 08/19/2016  . Pericardial cyst   . PONV (postoperative nausea and vomiting)    Last surgery 06/2014- nausea not vomitting was pre medicated.  Marland Kitchen PSVT (paroxysmal supraventricular tachycardia) (HCC)     Past Surgical History:  Procedure Laterality Date  . ABDOMINAL HYSTERECTOMY    . ANTERIOR CRUCIATE LIGAMENT REPAIR Left 1998   Menicus, PCL Screw  . CHOLECYSTECTOMY    . CHONDROPLASTY Right 04/10/2017   Procedure: CHONDROPLASTY;  Surgeon: Gean Birchwood, MD;  Location: [facility];  Service: Orthopedics;  Laterality: Right;  . JOINT REPLACEMENT    . KNEE ARTHROSCOPY Right    Menicus  . KNEE ARTHROSCOPY Right 04/10/2017   Procedure: ARTHROSCOPY KNEE;  Surgeon: Gean Birchwood, MD;  Location: [facility];  Service: Orthopedics;  Laterality: Right;  . KNEE ARTHROSCOPY WITH LATERAL MENISECTOMY Right 04/10/2017   Procedure: KNEE ARTHROSCOPY WITH  PARTIAL LATERAL MENISECTOMY;  Surgeon: Gean Birchwood, MD;  Location: [facility];  Service: Orthopedics;  Laterality: Right;  . LAPAROSCOPIC TOTAL HYSTERECTOMY  07/04/14  . ORIF ELBOW FRACTURE Left 04/04/2015   Procedure: OPEN REDUCTION INTERNAL FIXATION (ORIF) LEFT ELBOW/OLECRANON FRACTURE;  Surgeon: Bradly Bienenstock, MD;  Location: [facility];  Service: Orthopedics;  Laterality: Left;  . right total knee replacement    . TONSILLECTOMY    . TOTAL KNEE ARTHROPLASTY Right 12/07/2017   Procedure: RIGHT TOTAL KNEE ARTHROPLASTY;  Surgeon: Gean Birchwood, MD;  Location: [facility];  Service: Orthopedics;  Laterality: Right;    Prior to Admission medications   Medication Sig Start Date End Date Taking? Authorizing Provider  Acetaminophen 325 MG CAPS acetaminophen 300 mg-codeine 30 mg tablet    [provider]  albuterol (VENTOLIN HFA) 108 (90 Base) MCG/ACT inhaler Ventolin HFA 90 mcg/actuation aerosol inhaler    [provider]  ALPRAZolam (XANAX) 0.5 MG tablet Take  0.5 mg by mouth 2 (two) times daily as needed for anxiety.     [provider]  buPROPion (WELLBUTRIN XL) 150 MG 24 hr tablet TAKE 1 TABLET BY MOUTH ONCE DAILY 03/09/18   [provider]  cephALEXin (KEFLEX) 500 MG capsule cephalexin 500 mg capsule    [provider]  chlorpheniramine-HYDROcodone (TUSSIONEX) 10-8 MG/5ML SUER hydrocodone 10 mg-chlorpheniramine 8 mg/5 mL oral susp extend.rel 12hr    [provider]  ciprofloxacin  (CIPRO) 500 MG tablet Take 1 tablet (500 mg total) by mouth 2 (two) times daily. 05/06/18   Delfino Lovett, MD  clindamycin (CLEOCIN) 150 MG capsule clindamycin HCl 150 mg capsule    [provider]  clonazePAM (KLONOPIN) 0.5 MG tablet  03/29/18   [provider]  diazepam (VALIUM) 5 MG tablet diazepam 5 mg tablet    [provider]  diclofenac (VOLTAREN) 75 MG EC tablet Take by mouth. 01/28/18 01/28/19  [provider]  docusate sodium (DOCQLACE) 100 MG capsule TK 1 C PO BID 04/05/15   [provider]  doxycycline (VIBRAMYCIN) 100 MG capsule doxycycline hyclate 100 mg capsule    [provider]  DULoxetine (CYMBALTA) 20 MG capsule Cymbalta    [provider]  DULoxetine (CYMBALTA) 60 MG capsule Take 60 mg by mouth daily.     [provider]  gabapentin (NEURONTIN) 300 MG capsule Take 300 mg by mouth 2 (two) times daily.    [provider]  HYDROcodone-acetaminophen (NORCO/VICODIN) 5-325 MG tablet hydrocodone 5 mg-acetaminophen 325 mg tablet    [provider]  ibuprofen (ADVIL,MOTRIN) 400 MG tablet Take 1 tablet (400 mg total) by mouth every 6 (six) hours as needed. 05/06/18   Delfino Lovett, MD  levofloxacin (LEVAQUIN) 500 MG tablet levofloxacin 500 mg tablet    [provider]  meloxicam (MOBIC) 15 MG tablet meloxicam 15 mg tablet    [provider]  methocarbamol (ROBAXIN) 750 MG tablet Take 1 tablet (750 mg total) by mouth every 6 (six) hours as needed for muscle spasms. Patient not taking: Reported on 05/07/2018 12/07/17   Allena Katz, PA-C  metroNIDAZOLE (FLAGYL) 500 MG tablet Take 1 tablet (500 mg total) by mouth 3 (three) times daily. 05/06/18   Delfino Lovett, MD  sertraline (ZOLOFT) 100 MG tablet Take 100 mg by mouth daily.    [provider]  temazepam (RESTORIL) 30 MG capsule Take 30 mg by mouth at bedtime as needed for sleep.     [provider]    Family History  Problem  Relation Age of Onset  . Ovarian cancer Mother   . Esophageal cancer Father      Social History   Tobacco Use  . Smoking status: Former Smoker    Years: 10.00  . Smokeless tobacco: Never Used  . Tobacco comment: Quit 2006  Substance Use Topics  . Alcohol use: Yes    Comment: OCC  . Drug use: No    Allergies as of 06/10/2018 - Review Complete 05/25/2018  Allergen Reaction Noted  . Augmentin [amoxicillin-pot clavulanate] Rash and Other (See Comments) 04/03/2015  . Vicodin [hydrocodone-acetaminophen] Nausea And Vomiting, Rash, and Nausea Only 04/03/2015  . Codeine sulfate Nausea And Vomiting 08/18/2014  . Hydrocodone-acetaminophen Nausea Only 08/18/2014  . Hydrocodone-homatropine Other (See Comments) 08/18/2014  . Clavulanic acid Rash 06/09/2015    Review of Systems:    All systems reviewed and negative except where noted in HPI.   Physical Exam:  There were no  vitals taken for this visit. No LMP recorded. Patient has had a hysterectomy. General:   Alert,  Well-developed, well-nourished, pleasant and cooperative in NAD Head:  Normocephalic and atraumatic. Eyes:  Sclera clear, no icterus.   Conjunctiva pink. Ears:  Normal auditory acuity. Nose:  No deformity, discharge, or lesions. Mouth:  No deformity or lesions,oropharynx pink & moist. Neck:  Supple; no masses or thyromegaly. Lungs:  Respirations even and unlabored.  Clear throughout to auscultation.   No wheezes, crackles, or rhonchi. No acute distress. Heart:  Regular rate and rhythm; no murmurs, clicks, rubs, or gallops. Abdomen:  Normal bowel sounds.  No bruits.  Soft, non-tender and non-distended without masses, hepatosplenomegaly or hernias noted.  No guarding or rebound tenderness.  Negative Carnett sign.   Rectal:  Deferred.  Msk:  Symmetrical without gross deformities.  Good, equal movement & strength bilaterally. Pulses:  Normal pulses noted. Extremities:  No clubbing or edema.  No cyanosis. Neurologic:  Alert  and oriented x3;  grossly normal neurologically. Skin:  Intact without significant lesions or rashes.  No jaundice. Lymph Nodes:  No significant cervical adenopathy. Psych:  Alert and cooperative. Normal mood and affect.  Imaging Studies: No results found.  Assessment and Plan:   Sampson SiStacy I Michele is a 37 y.o. y/o female who comes in today with a history of a recent admission to the hospital for diverticulitis.  The patient had improvement in her CT scan on a repeat CT scan before discharge.  The patient states she still has left-sided abdominal pain and continues to have diarrhea even though she has stopped antibiotics.  The patient states she has anywhere from 10 or more bowel meds during the day.  She states that she has already had 3 this morning without eating anything. The patient will be set up for a colonoscopy and evaluation of her extensive diverticulitis in approximately 6 weeks.  The patient continues to have abdominal pain and has been told that if her diarrhea does not improve she may need to be on probiotics.  The patient will have her stool sent off for C. difficile and GI PCR.  The patient has been explained the plan and agrees with it.  Midge Miniumarren Paislie Tessler, MD. Clementeen GrahamFACG    Note: This dictation was prepared with Dragon dictation along with smaller phrase technology. Any transcriptional errors that result from this process are unintentional.

## 2018-06-18 NOTE — Telephone Encounter (Signed)
Left another message for the patient to contact our office I have also mailed a letter for the patient to contact our office.

## 2018-10-06 ENCOUNTER — Ambulatory Visit: Payer: Self-pay | Attending: Oncology

## 2018-10-06 VITALS — BP 127/85 | HR 99 | Temp 98.1°F | Ht 69.0 in | Wt 222.0 lb

## 2018-10-06 DIAGNOSIS — N63 Unspecified lump in unspecified breast: Secondary | ICD-10-CM

## 2018-10-06 NOTE — Progress Notes (Signed)
  Subjective:     Patient ID: Brenda Sellers, female   DOB: 11-24-1981, 37 y.o.   MRN: [id number]  HPI   Review of Systems     Objective:   Physical Exam  Pulmonary/Chest: Right breast exhibits no inverted nipple, no mass, no nipple discharge, no skin change and no tenderness. Left breast exhibits no inverted nipple, no mass, no nipple discharge, no skin change and no tenderness. Breasts are asymmetrical.  Right breast larger than left; thickening lower outer right breast          Assessment:     37 year old patient presents for [facility] clinic visit. Patient complains of right breast lump increasing in size over last six months.  States right breast has also increased in size.   Patient screened, and meets [facility] eligibility.  Patient does not have insurance, Medicare or Medicaid.  Handout given on Affordable Care Act.  Instructed patient on breast self awareness using teach back method.  Clinical breast exam reveals fibroglandular thickening 7 o'clock right breast, but no discrete mass.  Risk Assessment    Risk Scores      10/06/2018   Last edited by: Jim LikeLambert, Sheena M, RN   5-year risk: 0.5 %   Lifetime risk: 10.2 %             Plan:  Sent for bilateral diagnostic mammogram, and ultrasound.  Brenda Sellers to schedule appointment.

## 2018-10-15 ENCOUNTER — Ambulatory Visit
Admission: RE | Admit: 2018-10-15 | Discharge: 2018-10-15 | Disposition: A | Discharge status: Home / Self Care | Payer: Self-pay | Source: Ambulatory Visit | Attending: Oncology | Admitting: Oncology

## 2018-10-15 DIAGNOSIS — N63 Unspecified lump in unspecified breast: Secondary | ICD-10-CM

## 2018-10-24 NOTE — Progress Notes (Signed)
Radiologist discussed benign findings with patient.    Patient to return in one year for annual screening.  Copy to [facility].

## 2019-02-07 ENCOUNTER — Other Ambulatory Visit: Payer: Self-pay | Admitting: Orthopedic Surgery

## 2019-02-07 DIAGNOSIS — M25561 Pain in right knee: Secondary | ICD-10-CM

## 2019-02-09 ENCOUNTER — Ambulatory Visit
Admission: RE | Admit: 2019-02-09 | Discharge: 2019-02-09 | Disposition: A | Discharge status: Home / Self Care | Payer: Self-pay | Source: Ambulatory Visit | Attending: Orthopedic Surgery | Admitting: Orthopedic Surgery

## 2019-02-09 ENCOUNTER — Encounter
Admission: RE | Admit: 2019-02-09 | Discharge: 2019-02-09 | Disposition: A | Discharge status: Home / Self Care | Payer: PRIVATE HEALTH INSURANCE | Source: Ambulatory Visit | Attending: Orthopedic Surgery | Admitting: Orthopedic Surgery

## 2019-02-09 ENCOUNTER — Other Ambulatory Visit: Payer: Self-pay

## 2019-02-09 DIAGNOSIS — M25561 Pain in right knee: Secondary | ICD-10-CM | POA: Diagnosis present

## 2019-02-09 MED ORDER — TECHNETIUM TC 99M MEDRONATE IV KIT
20.0000 | PACK | Freq: Once | INTRAVENOUS | Status: AC | PRN
  Administered 2019-02-09: 23.059 via INTRAVENOUS

## 2020-07-20 ENCOUNTER — Other Ambulatory Visit: Payer: Self-pay | Admitting: Family Medicine

## 2020-11-21 ENCOUNTER — Other Ambulatory Visit: Payer: Self-pay | Admitting: Internal Medicine

## 2020-11-26 ENCOUNTER — Other Ambulatory Visit: Payer: Self-pay | Admitting: Internal Medicine

## 2021-02-11 ENCOUNTER — Other Ambulatory Visit: Payer: Self-pay | Admitting: Family Medicine

## 2021-02-28 ENCOUNTER — Other Ambulatory Visit: Payer: Self-pay | Admitting: Internal Medicine

## 2021-02-28 ENCOUNTER — Other Ambulatory Visit: Payer: Self-pay

## 2021-02-28 MED FILL — Zolpidem Tartrate Tab 10 MG: ORAL | 30 days supply | Qty: 30 | Fill #0 | Status: AC

## 2021-02-28 MED FILL — Sertraline HCl Tab 50 MG: ORAL | 90 days supply | Qty: 90 | Fill #0 | Status: AC

## 2021-02-28 MED FILL — Tramadol HCl Tab 50 MG: ORAL | 30 days supply | Qty: 90 | Fill #0 | Status: AC

## 2021-03-01 ENCOUNTER — Other Ambulatory Visit: Payer: Self-pay

## 2021-03-02 ENCOUNTER — Other Ambulatory Visit: Payer: Self-pay

## 2021-03-05 ENCOUNTER — Other Ambulatory Visit: Payer: Self-pay

## 2021-03-05 ENCOUNTER — Other Ambulatory Visit: Payer: Self-pay | Admitting: Internal Medicine

## 2021-03-07 ENCOUNTER — Other Ambulatory Visit: Payer: Self-pay

## 2021-04-01 ENCOUNTER — Other Ambulatory Visit: Payer: Self-pay

## 2021-04-01 MED FILL — Zolpidem Tartrate Tab 10 MG: ORAL | 30 days supply | Qty: 30 | Fill #1 | Status: AC

## 2021-04-01 MED FILL — Tramadol HCl Tab 50 MG: ORAL | 30 days supply | Qty: 90 | Fill #1 | Status: AC

## 2021-04-30 ENCOUNTER — Other Ambulatory Visit: Payer: Self-pay

## 2021-04-30 MED FILL — Tramadol HCl Tab 50 MG: ORAL | 30 days supply | Qty: 90 | Fill #2 | Status: AC

## 2021-04-30 MED FILL — Zolpidem Tartrate Tab 10 MG: ORAL | 30 days supply | Qty: 30 | Fill #2 | Status: AC

## 2021-05-30 ENCOUNTER — Other Ambulatory Visit: Payer: Self-pay

## 2021-05-30 MED ORDER — DOXYCYCLINE HYCLATE 100 MG PO CAPS
100.0000 mg | ORAL_CAPSULE | Freq: Two times a day (BID) | ORAL | 0 refills | Status: DC
  Filled 2021-05-30: qty 14, 7d supply, fill #0

## 2021-05-30 MED ORDER — SERTRALINE HCL 50 MG PO TABS
50.0000 mg | ORAL_TABLET | Freq: Every day | ORAL | 3 refills | Status: DC
  Filled 2021-05-30 – 2021-09-26 (×2): qty 90, 90d supply, fill #0

## 2021-05-30 MED ORDER — TRAMADOL HCL 50 MG PO TABS
50.0000 mg | ORAL_TABLET | Freq: Three times a day (TID) | ORAL | 5 refills | Status: DC
  Filled 2021-05-30: qty 90, 30d supply, fill #0
  Filled 2021-06-27: qty 21, 7d supply, fill #1
  Filled 2021-07-02: qty 69, 23d supply, fill #2
  Filled 2021-07-26: qty 90, 30d supply, fill #3
  Filled 2021-08-27: qty 90, 30d supply, fill #4
  Filled 2021-09-26: qty 90, 30d supply, fill #5
  Filled 2021-10-28: qty 90, 30d supply, fill #6

## 2021-05-30 MED ORDER — ZOLPIDEM TARTRATE 10 MG PO TABS
ORAL_TABLET | ORAL | 5 refills | Status: DC
  Filled 2021-05-30: qty 30, 30d supply, fill #0
  Filled 2021-06-27: qty 30, 30d supply, fill #1
  Filled 2021-07-26: qty 30, 30d supply, fill #2
  Filled 2021-08-27: qty 30, 30d supply, fill #3
  Filled 2021-09-26: qty 30, 30d supply, fill #4
  Filled 2021-10-28: qty 30, 30d supply, fill #5

## 2021-05-30 MED ORDER — AZITHROMYCIN 500 MG PO TABS
ORAL_TABLET | ORAL | 0 refills | Status: DC
  Filled 2021-05-30: qty 5, 5d supply, fill #0

## 2021-05-30 MED ORDER — PREDNISONE 20 MG PO TABS
ORAL_TABLET | ORAL | 0 refills | Status: DC
  Filled 2021-05-30: qty 10, 5d supply, fill #0

## 2021-05-30 MED ORDER — ALPRAZOLAM 0.5 MG PO TABS
ORAL_TABLET | ORAL | 5 refills | Status: DC
  Filled 2021-05-30: qty 60, 30d supply, fill #0
  Filled 2021-06-27: qty 60, 30d supply, fill #1
  Filled 2021-08-27: qty 60, 30d supply, fill #2
  Filled 2021-09-26: qty 60, 30d supply, fill #3

## 2021-05-30 MED FILL — Sertraline HCl Tab 50 MG: ORAL | 90 days supply | Qty: 90 | Fill #1 | Status: AC

## 2021-06-05 ENCOUNTER — Other Ambulatory Visit: Payer: Self-pay

## 2021-06-05 MED ORDER — ALBUTEROL SULFATE (2.5 MG/3ML) 0.083% IN NEBU
INHALATION_SOLUTION | RESPIRATORY_TRACT | 2 refills | Status: DC
  Filled 2021-06-05: qty 75, 12d supply, fill #0

## 2021-06-05 MED ORDER — BUDESONIDE 1 MG/2ML IN SUSP
RESPIRATORY_TRACT | 2 refills | Status: DC
  Filled 2021-06-05: qty 60, 30d supply, fill #0

## 2021-06-05 MED ORDER — LEVOFLOXACIN 500 MG PO TABS
ORAL_TABLET | ORAL | 0 refills | Status: DC
  Filled 2021-06-05: qty 5, 5d supply, fill #0

## 2021-06-27 ENCOUNTER — Other Ambulatory Visit: Payer: Self-pay

## 2021-07-01 ENCOUNTER — Other Ambulatory Visit: Payer: Self-pay

## 2021-07-02 ENCOUNTER — Other Ambulatory Visit: Payer: Self-pay

## 2021-07-03 ENCOUNTER — Other Ambulatory Visit: Payer: Self-pay

## 2021-07-25 ENCOUNTER — Other Ambulatory Visit: Payer: Self-pay

## 2021-07-26 ENCOUNTER — Other Ambulatory Visit: Payer: Self-pay

## 2021-08-20 ENCOUNTER — Other Ambulatory Visit: Payer: Self-pay

## 2021-08-27 ENCOUNTER — Other Ambulatory Visit: Payer: Self-pay

## 2021-08-27 MED FILL — Sertraline HCl Tab 50 MG: ORAL | 90 days supply | Qty: 90 | Fill #2 | Status: AC

## 2021-09-19 ENCOUNTER — Other Ambulatory Visit: Payer: Self-pay

## 2021-09-19 MED ORDER — PREDNISONE 20 MG PO TABS
ORAL_TABLET | ORAL | 0 refills | Status: DC
  Filled 2021-09-19: qty 5, 5d supply, fill #0

## 2021-09-19 MED ORDER — ALBUTEROL SULFATE HFA 108 (90 BASE) MCG/ACT IN AERS
INHALATION_SPRAY | RESPIRATORY_TRACT | 0 refills | Status: AC
  Filled 2021-09-19: qty 18, 25d supply, fill #0

## 2021-09-19 MED ORDER — DOXYCYCLINE HYCLATE 100 MG PO CAPS
100.0000 mg | ORAL_CAPSULE | Freq: Two times a day (BID) | ORAL | 0 refills | Status: DC
  Filled 2021-09-19: qty 14, 7d supply, fill #0

## 2021-09-19 MED ORDER — PROMETHAZINE-DM 6.25-15 MG/5ML PO SYRP
ORAL_SOLUTION | ORAL | 0 refills | Status: DC
  Filled 2021-09-19: qty 120, 6d supply, fill #0

## 2021-09-19 MED ORDER — FLUTICASONE PROPIONATE 50 MCG/ACT NA SUSP
NASAL | 0 refills | Status: DC
  Filled 2021-09-19: qty 16, 30d supply, fill #0

## 2021-09-26 ENCOUNTER — Other Ambulatory Visit: Payer: Self-pay

## 2021-09-26 MED ORDER — HYDROCOD POLST-CPM POLST ER 10-8 MG/5ML PO SUER
ORAL | 0 refills | Status: DC
  Filled 2021-09-26: qty 115, 10d supply, fill #0

## 2021-09-26 MED ORDER — LEVOFLOXACIN 500 MG PO TABS
ORAL_TABLET | ORAL | 0 refills | Status: DC
  Filled 2021-09-26: qty 7, 7d supply, fill #0

## 2021-09-27 ENCOUNTER — Other Ambulatory Visit: Payer: Self-pay

## 2021-10-15 ENCOUNTER — Other Ambulatory Visit: Payer: Self-pay

## 2021-10-28 ENCOUNTER — Other Ambulatory Visit: Payer: Self-pay

## 2021-11-25 ENCOUNTER — Other Ambulatory Visit: Payer: Self-pay

## 2021-11-26 ENCOUNTER — Other Ambulatory Visit: Payer: Self-pay

## 2021-11-27 ENCOUNTER — Other Ambulatory Visit: Payer: Self-pay

## 2021-11-27 MED ORDER — SERTRALINE HCL 50 MG PO TABS
ORAL_TABLET | ORAL | 3 refills | Status: DC
Start: 2021-11-27 — End: 2022-11-03
  Filled 2021-11-27: qty 90, 90d supply, fill #0
  Filled 2022-03-26: qty 90, 90d supply, fill #1
  Filled 2022-06-27: qty 90, 90d supply, fill #2
  Filled 2022-10-27: qty 30, 30d supply, fill #3

## 2021-11-27 MED ORDER — TRAMADOL HCL 50 MG PO TABS
50.0000 mg | ORAL_TABLET | Freq: Three times a day (TID) | ORAL | 5 refills | Status: DC
  Filled 2021-11-27: qty 90, 30d supply, fill #0

## 2021-11-27 MED ORDER — ALPRAZOLAM 0.5 MG PO TABS
ORAL_TABLET | ORAL | 5 refills | Status: DC
  Filled 2021-11-27: qty 60, 30d supply, fill #0
  Filled 2022-03-26: qty 60, 30d supply, fill #1
  Filled 2022-04-29: qty 60, 30d supply, fill #2

## 2021-11-27 MED ORDER — ZOLPIDEM TARTRATE 10 MG PO TABS
10.0000 mg | ORAL_TABLET | Freq: Every evening | ORAL | 5 refills | Status: DC | PRN
  Filled 2021-11-27: qty 30, 30d supply, fill #0
  Filled 2022-01-31: qty 30, 30d supply, fill #1
  Filled 2022-03-26: qty 30, 30d supply, fill #2

## 2021-11-27 MED ORDER — TRAMADOL HCL 50 MG PO TABS
ORAL_TABLET | ORAL | 5 refills | Status: DC
  Filled 2021-11-27 – 2021-12-13 (×3): qty 120, 30d supply, fill #0
  Filled 2022-01-20: qty 120, 30d supply, fill #1
  Filled 2022-02-24: qty 120, 30d supply, fill #2
  Filled 2022-03-26: qty 120, 30d supply, fill #3
  Filled 2022-04-29: qty 120, 30d supply, fill #4

## 2021-11-27 MED ORDER — ZOLPIDEM TARTRATE 10 MG PO TABS
ORAL_TABLET | ORAL | 5 refills | Status: DC
  Filled 2021-11-27: qty 30, 30d supply, fill #0
  Filled 2021-12-30: qty 30, 30d supply, fill #1
  Filled 2022-04-29: qty 30, 30d supply, fill #2

## 2021-12-10 ENCOUNTER — Other Ambulatory Visit: Payer: Self-pay

## 2021-12-11 ENCOUNTER — Other Ambulatory Visit: Payer: Self-pay

## 2021-12-12 ENCOUNTER — Other Ambulatory Visit: Payer: Self-pay

## 2021-12-13 ENCOUNTER — Other Ambulatory Visit: Payer: Self-pay

## 2021-12-30 ENCOUNTER — Other Ambulatory Visit: Payer: Self-pay

## 2021-12-30 ENCOUNTER — Telehealth: Payer: Self-pay | Admitting: Nurse Practitioner

## 2021-12-30 DIAGNOSIS — J069 Acute upper respiratory infection, unspecified: Secondary | ICD-10-CM

## 2021-12-30 MED ORDER — PAXLOVID (300/100) 20 X 150 MG & 10 X 100MG PO TBPK: ORAL_TABLET | ORAL | 0 refills | Status: DC

## 2021-12-30 MED ORDER — DOXYCYCLINE HYCLATE 100 MG PO CAPS
100.0000 mg | ORAL_CAPSULE | Freq: Two times a day (BID) | ORAL | 0 refills | Status: DC
  Filled 2021-12-30: qty 14, 7d supply, fill #0

## 2021-12-30 MED ORDER — BENZONATATE 100 MG PO CAPS
100.0000 mg | ORAL_CAPSULE | Freq: Three times a day (TID) | ORAL | 0 refills | Status: DC | PRN
  Filled 2021-12-30: qty 30, 10d supply, fill #0

## 2021-12-30 MED ORDER — FLUTICASONE PROPIONATE 50 MCG/ACT NA SUSP
2.0000 | Freq: Every day | NASAL | 6 refills | Status: DC
  Filled 2021-12-30: qty 16, 30d supply, fill #0

## 2021-12-30 NOTE — Progress Notes (Signed)
E-Visit for Upper Respiratory Infection  ° °We are sorry you are not feeling well.  Here is how we plan to help! ° °Based on what you have shared with me, it looks like you may have a viral upper respiratory infection.  Upper respiratory infections are caused by a large number of viruses; however, rhinovirus is the most common cause.  ° °Symptoms vary from person to person, with common symptoms including sore throat, cough, fatigue or lack of energy and feeling of general discomfort.  A low-grade fever of up to 100.4 may present, but is often uncommon.  Symptoms vary however, and are closely related to a person's age or underlying illnesses.  The most common symptoms associated with an upper respiratory infection are nasal discharge or congestion, cough, sneezing, headache and pressure in the ears and face.  These symptoms usually persist for about 3 to 10 days, but can last up to 2 weeks.  It is important to know that upper respiratory infections do not cause serious illness or complications in most cases.   ° °Upper respiratory infections can be transmitted from person to person, with the most common method of transmission being a person's hands.  The virus is able to live on the skin and can infect other persons for up to 2 hours after direct contact.  Also, these can be transmitted when someone coughs or sneezes; thus, it is important to cover the mouth to reduce this risk.  To keep the spread of the illness at bay, good hand hygiene is very important. ° °This is an infection that is most likely caused by a virus. There are no specific treatments other than to help you with the symptoms until the infection runs its course.  We are sorry you are not feeling well.  Here is how we plan to help! ° ° °For nasal congestion, you may use an oral decongestants such as Mucinex D or if you have glaucoma or high blood pressure use plain Mucinex.  Saline nasal spray or nasal drops can help and can safely be used as often as  needed for congestion.  For your congestion, I have prescribed Fluticasone nasal spray one spray in each nostril twice a day ° °If you do not have a history of heart disease, hypertension, diabetes or thyroid disease, prostate/bladder issues or glaucoma, you may also use Sudafed to treat nasal congestion.  It is highly recommended that you consult with a pharmacist or your primary care physician to ensure this medication is safe for you to take.    ° °If you have a cough, you may use cough suppressants such as Delsym and Robitussin.  If you have glaucoma or high blood pressure, you can also use Coricidin HBP.   °For cough I have prescribed for you A prescription cough medication called Tessalon Perles 100 mg. You may take 1-2 capsules every 8 hours as needed for cough ° °If you have a sore or scratchy throat, use a saltwater gargle- ¼ to ½ teaspoon of salt dissolved in a 4-ounce to 8-ounce glass of warm water.  Gargle the solution for approximately 15-30 seconds and then spit.  It is important not to swallow the solution.  You can also use throat lozenges/cough drops and Chloraseptic spray to help with throat pain or discomfort.  Warm or cold liquids can also be helpful in relieving throat pain. ° °For headache, pain or general discomfort, you can use Ibuprofen or Tylenol as directed.   °Some authorities believe   that zinc sprays or the use of Echinacea may shorten the course of your symptoms.   HOME CARE Only take medications as instructed by your medical team. Be sure to drink plenty of fluids. Water is fine as well as fruit juices, sodas and electrolyte beverages. You may want to stay away from caffeine or alcohol. If you are nauseated, try taking small sips of liquids. How do you know if you are getting enough fluid? Your urine should be a pale yellow or almost colorless. Get rest. Taking a steamy shower or using a humidifier may help nasal congestion and ease sore throat pain. You can place a towel over  your head and breathe in the steam from hot water coming from a faucet. Using a saline nasal spray works much the same way. Cough drops, hard candies and sore throat lozenges may ease your cough. Avoid close contacts especially the very young and the elderly Cover your mouth if you cough or sneeze Always remember to wash your hands.   GET HELP RIGHT AWAY IF: You develop worsening fever. If your symptoms do not improve within 10 days You develop yellow or green discharge from your nose over 3 days. You have coughing fits You develop a severe head ache or visual changes. You develop shortness of breath, difficulty breathing or start having chest pain Your symptoms persist after you have completed your treatment plan  MAKE SURE YOU  Understand these instructions. Will watch your condition. Will get help right away if you are not doing well or get worse.  Thank you for choosing an e-visit.  Your e-visit answers were reviewed by a board certified advanced clinical practitioner to complete your personal care plan. Depending upon the condition, your plan could have included both over the counter or prescription medications.  Please review your pharmacy choice. Make sure the pharmacy is open so you can pick up prescription now. If there is a problem, you may contact your provider through [employer] and have the prescription routed to another pharmacy.  Your safety is important to [place]. If you have drug allergies check your prescription carefully.   For the next 24 hours you can use MyChart to ask questions about today's visit, request a non-urgent call back, or ask for a work or school excuse. You will get an email in the next two days asking about your experience. I hope that your e-visit has been valuable and will speed your recovery.  I spent approximately 10 minutes reviewing the patient's history, current symptoms and coordinating their care today.    Meds ordered this encounter   Medications   fluticasone (FLONASE) 50 MCG/ACT nasal spray    Sig: Place 2 sprays into both nostrils daily.    Dispense:  16 g    Refill:  6   benzonatate (TESSALON) 100 MG capsule    Sig: Take 1 capsule (100 mg total) by mouth 3 (three) times daily as needed for cough.    Dispense:  30 capsule    Refill:  0

## 2021-12-31 ENCOUNTER — Other Ambulatory Visit: Payer: Self-pay

## 2022-01-20 ENCOUNTER — Other Ambulatory Visit: Payer: Self-pay

## 2022-01-31 ENCOUNTER — Other Ambulatory Visit: Payer: Self-pay

## 2022-02-04 ENCOUNTER — Other Ambulatory Visit: Payer: Self-pay | Admitting: Internal Medicine

## 2022-02-04 ENCOUNTER — Other Ambulatory Visit: Payer: Self-pay

## 2022-02-04 ENCOUNTER — Ambulatory Visit
Admission: RE | Admit: 2022-02-04 | Discharge: 2022-02-04 | Disposition: A | Discharge status: Home / Self Care | Payer: 59 | Source: Ambulatory Visit | Attending: Internal Medicine | Admitting: Internal Medicine

## 2022-02-04 DIAGNOSIS — K3533 Acute appendicitis with perforation and localized peritonitis, with abscess: Secondary | ICD-10-CM

## 2022-02-04 MED ORDER — DICLOFENAC SODIUM 75 MG PO TBEC
DELAYED_RELEASE_TABLET | ORAL | 1 refills | Status: DC
  Filled 2022-02-04: qty 60, 30d supply, fill #0

## 2022-02-04 MED ORDER — IOHEXOL 300 MG/ML  SOLN
100.0000 mL | Freq: Once | INTRAMUSCULAR | Status: AC | PRN
Start: 2022-02-04 — End: 2022-02-04
  Administered 2022-02-04: 100 mL via INTRAVENOUS

## 2022-02-06 ENCOUNTER — Other Ambulatory Visit: Payer: Self-pay | Admitting: Internal Medicine

## 2022-02-11 ENCOUNTER — Other Ambulatory Visit: Payer: Self-pay

## 2022-02-24 ENCOUNTER — Other Ambulatory Visit: Payer: Self-pay

## 2022-02-27 ENCOUNTER — Ambulatory Visit
Admission: RE | Admit: 2022-02-27 | Discharge: 2022-02-27 | Disposition: A | Discharge status: Home / Self Care | Payer: 59 | Source: Ambulatory Visit | Attending: Internal Medicine | Admitting: Internal Medicine

## 2022-02-27 ENCOUNTER — Other Ambulatory Visit: Payer: Self-pay | Admitting: Internal Medicine

## 2022-02-27 DIAGNOSIS — N63 Unspecified lump in unspecified breast: Secondary | ICD-10-CM | POA: Diagnosis present

## 2022-02-27 DIAGNOSIS — N6459 Other signs and symptoms in breast: Secondary | ICD-10-CM

## 2022-02-27 DIAGNOSIS — Z1231 Encounter for screening mammogram for malignant neoplasm of breast: Secondary | ICD-10-CM

## 2022-03-11 ENCOUNTER — Other Ambulatory Visit: Payer: Self-pay

## 2022-03-11 MED ORDER — CELECOXIB 200 MG PO CAPS
ORAL_CAPSULE | ORAL | 0 refills | Status: DC
  Filled 2022-03-11: qty 60, 30d supply, fill #0

## 2022-03-21 ENCOUNTER — Other Ambulatory Visit: Payer: Self-pay | Admitting: Student

## 2022-03-21 DIAGNOSIS — Z9889 Other specified postprocedural states: Secondary | ICD-10-CM

## 2022-03-21 DIAGNOSIS — M1732 Unilateral post-traumatic osteoarthritis, left knee: Secondary | ICD-10-CM

## 2022-03-21 DIAGNOSIS — S83242A Other tear of medial meniscus, current injury, left knee, initial encounter: Secondary | ICD-10-CM

## 2022-03-26 ENCOUNTER — Other Ambulatory Visit: Payer: Self-pay

## 2022-04-04 ENCOUNTER — Ambulatory Visit
Admission: RE | Admit: 2022-04-04 | Discharge: 2022-04-04 | Disposition: A | Discharge status: Home / Self Care | Payer: 59 | Source: Ambulatory Visit | Attending: Student | Admitting: Student

## 2022-04-04 DIAGNOSIS — Z9889 Other specified postprocedural states: Secondary | ICD-10-CM | POA: Insufficient documentation

## 2022-04-04 DIAGNOSIS — S83242A Other tear of medial meniscus, current injury, left knee, initial encounter: Secondary | ICD-10-CM | POA: Insufficient documentation

## 2022-04-04 DIAGNOSIS — M1732 Unilateral post-traumatic osteoarthritis, left knee: Secondary | ICD-10-CM | POA: Insufficient documentation

## 2022-04-22 ENCOUNTER — Other Ambulatory Visit: Payer: Self-pay

## 2022-04-22 MED ORDER — OXYCODONE HCL 5 MG PO TABS
ORAL_TABLET | ORAL | 0 refills | Status: DC
  Filled 2022-04-22: qty 10, 5d supply, fill #0

## 2022-04-25 ENCOUNTER — Other Ambulatory Visit: Payer: Self-pay

## 2022-04-25 MED ORDER — HYDROCODONE-ACETAMINOPHEN 5-325 MG PO TABS
ORAL_TABLET | ORAL | 0 refills | Status: DC
  Filled 2022-04-25: qty 10, 5d supply, fill #0

## 2022-04-25 MED ORDER — ONDANSETRON 4 MG PO TBDP
ORAL_TABLET | ORAL | 0 refills | Status: DC
  Filled 2022-04-25: qty 20, 7d supply, fill #0

## 2022-04-29 ENCOUNTER — Other Ambulatory Visit: Payer: Self-pay

## 2022-04-30 ENCOUNTER — Other Ambulatory Visit: Payer: Self-pay

## 2022-04-30 MED ORDER — ZOLPIDEM TARTRATE 10 MG PO TABS
10.0000 mg | ORAL_TABLET | Freq: Every evening | ORAL | 5 refills | Status: DC | PRN
  Filled 2022-05-30: qty 30, 30d supply, fill #0
  Filled 2022-06-30: qty 30, 30d supply, fill #1
  Filled 2022-07-30: qty 30, 30d supply, fill #2
  Filled 2022-08-28: qty 30, 30d supply, fill #3
  Filled 2022-09-30: qty 30, 30d supply, fill #4
  Filled 2022-10-27 (×2): qty 30, 30d supply, fill #5

## 2022-04-30 MED ORDER — TRAMADOL HCL 50 MG PO TABS
ORAL_TABLET | ORAL | 5 refills | Status: DC
  Filled 2022-05-30: qty 120, 30d supply, fill #0
  Filled 2022-06-30: qty 120, 30d supply, fill #1
  Filled 2022-07-30: qty 120, 30d supply, fill #2
  Filled 2022-08-28: qty 120, 30d supply, fill #3
  Filled 2022-09-30: qty 120, 30d supply, fill #4
  Filled 2022-10-27 (×2): qty 120, 30d supply, fill #5

## 2022-04-30 MED ORDER — ALPRAZOLAM 0.5 MG PO TABS
ORAL_TABLET | ORAL | 5 refills | Status: DC
  Filled 2022-05-30: qty 60, 30d supply, fill #0
  Filled 2022-06-30: qty 60, 30d supply, fill #1
  Filled 2022-08-28: qty 60, 30d supply, fill #2
  Filled 2022-09-30: qty 60, 30d supply, fill #3
  Filled 2022-10-27 (×2): qty 60, 30d supply, fill #4

## 2022-05-01 ENCOUNTER — Other Ambulatory Visit: Payer: Self-pay

## 2022-05-01 MED ORDER — HYDROCODONE-ACETAMINOPHEN 5-325 MG PO TABS
ORAL_TABLET | ORAL | 0 refills | Status: DC
  Filled 2022-05-01: qty 10, 5d supply, fill #0

## 2022-05-20 ENCOUNTER — Other Ambulatory Visit: Payer: Self-pay

## 2022-05-20 MED ORDER — TIZANIDINE HCL 4 MG PO TABS
ORAL_TABLET | ORAL | 1 refills | Status: DC
  Filled 2022-05-20: qty 90, 30d supply, fill #0
  Filled 2022-07-30: qty 90, 30d supply, fill #1

## 2022-05-30 ENCOUNTER — Other Ambulatory Visit: Payer: Self-pay

## 2022-06-13 ENCOUNTER — Other Ambulatory Visit: Payer: Self-pay

## 2022-06-13 MED ORDER — DICLOFENAC SODIUM 75 MG PO TBEC
75.0000 mg | DELAYED_RELEASE_TABLET | Freq: Every day | ORAL | 11 refills | Status: DC
  Filled 2022-06-13: qty 30, 30d supply, fill #0

## 2022-06-27 ENCOUNTER — Other Ambulatory Visit: Payer: Self-pay

## 2022-06-30 ENCOUNTER — Other Ambulatory Visit: Payer: Self-pay

## 2022-06-30 MED ORDER — ALPRAZOLAM 0.5 MG PO TABS: ORAL_TABLET | ORAL | 5 refills | Status: DC

## 2022-06-30 MED ORDER — ZOLPIDEM TARTRATE 10 MG PO TABS: 10.0000 mg | ORAL_TABLET | Freq: Every evening | ORAL | 5 refills | Status: DC | PRN

## 2022-06-30 MED ORDER — TRAMADOL HCL 50 MG PO TABS: ORAL_TABLET | ORAL | 5 refills | Status: DC

## 2022-06-30 MED ORDER — SERTRALINE HCL 50 MG PO TABS: 50.0000 mg | ORAL_TABLET | Freq: Every day | ORAL | 3 refills | Status: DC

## 2022-07-04 ENCOUNTER — Other Ambulatory Visit: Payer: Self-pay

## 2022-07-04 MED ORDER — DOXYCYCLINE HYCLATE 100 MG PO CAPS
ORAL_CAPSULE | ORAL | 0 refills | Status: DC
  Filled 2022-07-04: qty 14, 7d supply, fill #0

## 2022-07-18 ENCOUNTER — Other Ambulatory Visit: Payer: Self-pay

## 2022-07-30 ENCOUNTER — Other Ambulatory Visit: Payer: Self-pay

## 2022-08-08 DIAGNOSIS — S83272A Complex tear of lateral meniscus, current injury, left knee, initial encounter: Secondary | ICD-10-CM | POA: Diagnosis not present

## 2022-08-08 DIAGNOSIS — S83232A Complex tear of medial meniscus, current injury, left knee, initial encounter: Secondary | ICD-10-CM | POA: Diagnosis not present

## 2022-08-08 DIAGNOSIS — Z9889 Other specified postprocedural states: Secondary | ICD-10-CM | POA: Diagnosis not present

## 2022-08-08 DIAGNOSIS — M1732 Unilateral post-traumatic osteoarthritis, left knee: Secondary | ICD-10-CM | POA: Diagnosis not present

## 2022-08-22 DIAGNOSIS — G43909 Migraine, unspecified, not intractable, without status migrainosus: Secondary | ICD-10-CM | POA: Diagnosis not present

## 2022-08-28 ENCOUNTER — Other Ambulatory Visit: Payer: Self-pay

## 2022-08-28 MED ORDER — TIZANIDINE HCL 4 MG PO TABS
ORAL_TABLET | ORAL | 1 refills | Status: DC
  Filled 2022-08-28: qty 90, 30d supply, fill #0
  Filled 2022-09-30: qty 90, 30d supply, fill #1

## 2022-08-29 ENCOUNTER — Other Ambulatory Visit: Payer: Self-pay

## 2022-08-29 MED ORDER — ALPRAZOLAM 0.5 MG PO TABS
0.5000 mg | ORAL_TABLET | Freq: Two times a day (BID) | ORAL | 5 refills | Status: AC
  Filled 2022-11-26: qty 60, 30d supply, fill #0
  Filled 2022-12-25: qty 60, 30d supply, fill #1
  Filled 2023-01-20 – 2023-01-22 (×2): qty 60, 30d supply, fill #2
  Filled 2023-02-24: qty 60, 30d supply, fill #3

## 2022-08-31 ENCOUNTER — Other Ambulatory Visit: Payer: Self-pay

## 2022-08-31 MED ORDER — SERTRALINE HCL 50 MG PO TABS
50.0000 mg | ORAL_TABLET | Freq: Every day | ORAL | 3 refills | Status: DC
  Filled 2022-08-31: qty 30, 30d supply, fill #0

## 2022-08-31 MED ORDER — ZOLPIDEM TARTRATE 10 MG PO TABS
10.0000 mg | ORAL_TABLET | Freq: Every evening | ORAL | 5 refills | Status: AC | PRN
Start: 2022-08-29 — End: ?
  Filled 2022-11-26: qty 30, 30d supply, fill #0
  Filled 2022-12-25 – 2022-12-28 (×2): qty 30, 30d supply, fill #1
  Filled 2023-01-20 – 2023-01-26 (×2): qty 30, 30d supply, fill #2

## 2022-08-31 MED ORDER — TRAMADOL HCL 50 MG PO TABS: ORAL_TABLET | ORAL | 5 refills | Status: DC

## 2022-09-01 ENCOUNTER — Other Ambulatory Visit: Payer: Self-pay

## 2022-09-30 ENCOUNTER — Other Ambulatory Visit: Payer: Self-pay

## 2022-10-24 ENCOUNTER — Other Ambulatory Visit: Payer: Self-pay | Admitting: Surgery

## 2022-10-27 ENCOUNTER — Other Ambulatory Visit: Payer: Self-pay

## 2022-10-27 MED ORDER — TIZANIDINE HCL 4 MG PO TABS
ORAL_TABLET | ORAL | 1 refills | Status: DC
  Filled 2022-10-27: qty 90, 30d supply, fill #0
  Filled 2022-11-26: qty 90, 30d supply, fill #1

## 2022-10-28 ENCOUNTER — Other Ambulatory Visit: Payer: Self-pay

## 2022-11-03 ENCOUNTER — Encounter
Admission: RE | Admit: 2022-11-03 | Discharge: 2022-11-03 | Disposition: A | Discharge status: Home / Self Care | Payer: 59 | Source: Ambulatory Visit | Attending: Surgery | Admitting: Surgery

## 2022-11-03 VITALS — Ht 69.0 in | Wt 190.0 lb

## 2022-11-03 DIAGNOSIS — Z01818 Encounter for other preprocedural examination: Secondary | ICD-10-CM | POA: Insufficient documentation

## 2022-11-03 DIAGNOSIS — Z01812 Encounter for preprocedural laboratory examination: Secondary | ICD-10-CM

## 2022-11-03 DIAGNOSIS — I471 Supraventricular tachycardia, unspecified: Secondary | ICD-10-CM | POA: Insufficient documentation

## 2022-11-03 DIAGNOSIS — S83272D Complex tear of lateral meniscus, current injury, left knee, subsequent encounter: Secondary | ICD-10-CM | POA: Diagnosis not present

## 2022-11-03 LAB — BASIC METABOLIC PANEL
Anion gap: 6 (ref 5–15)
BUN: 10 mg/dL (ref 6–20)
CO2: 29 mmol/L (ref 22–32)
Calcium: 9 mg/dL (ref 8.9–10.3)
Chloride: 103 mmol/L (ref 98–111)
Creatinine, Ser: 0.6 mg/dL (ref 0.44–1.00)
GFR, Estimated: >60 mL/min (ref >60)
Glucose, Bld: 88 mg/dL (ref 70–99)
Potassium: 3.8 mmol/L (ref 3.5–5.1)
Sodium: 138 mmol/L (ref 135–145)

## 2022-11-03 LAB — CBC
HCT: 41 % (ref 36.0–46.0)
Hemoglobin: 13.8 g/dL (ref 12.0–15.0)
MCH: 29.4 pg (ref 26.0–34.0)
MCHC: 33.7 g/dL (ref 30.0–36.0)
MCV: 87.2 fL (ref 80.0–100.0)
Platelets: 280 10*3/uL (ref 150–400)
RBC: 4.7 MIL/uL (ref 3.87–5.11)
RDW: 11.3 % — ABNORMAL LOW (ref 11.5–15.5)
WBC: 6.3 10*3/uL (ref 4.0–10.5)
nRBC: 0 % (ref 0.0–0.2)

## 2022-11-03 NOTE — Patient Instructions (Addendum)
Your procedure is scheduled on: Tuesday, December 19 Report to the Registration Desk on the 1st floor of the [facility] To find out your arrival time, please call [phone] between 1PM - 3PM on: Monday, December 18 If your arrival time is 6:00 am, do not arrive prior to that time as the [facility] entrance doors do not open until 6:00 am.  REMEMBER: Instructions that are not followed completely may result in serious medical risk, up to and including death; or upon the discretion of your surgeon and anesthesiologist your surgery may need to be rescheduled.  Do not eat food after midnight the night before surgery.  No gum chewing, lozengers or hard candies.  You may however, drink CLEAR liquids up to 2 hours before you are scheduled to arrive for your surgery. Do not drink anything within 2 hours of your scheduled arrival time.  Clear liquids include: - water  - apple juice without pulp - gatorade (not RED colors) - black coffee or tea (Do NOT add milk or creamers to the coffee or tea) Do NOT drink anything that is not on this list.  In addition, your doctor has ordered for you to drink the provided  Ensure Pre-Surgery Clear Carbohydrate Drink  or Gatorade G2 Drinking this carbohydrate drink up to two hours before surgery helps to reduce insulin resistance and improve patient outcomes. Please complete drinking 2 hours prior to scheduled arrival time.  TAKE THESE MEDICATIONS THE MORNING OF SURGERY WITH A SIP OF WATER:  Alprazolam if needed for anxiety Tramadol if needed for pain Albuterol inhaler  Use inhalers on the day of surgery and bring to the hospital.  One week prior to surgery: starting December 12 Stop Anti-inflammatories (NSAIDS) such as Advil, Aleve, Ibuprofen, Motrin, Naproxen, Naprosyn and Aspirin based products such as Excedrin, Goodys Powder, BC Powder. Stop ANY OVER THE COUNTER supplements until after surgery. You may however, continue to take Tylenol if  needed for pain up until the day of surgery.  No Alcohol for 24 hours before or after surgery.  No Smoking including e-cigarettes for 24 hours prior to surgery.  No chewable tobacco products for at least 6 hours prior to surgery.  No nicotine patches on the day of surgery.  Do not use any "recreational" drugs for at least a week prior to your surgery.  Please be advised that the combination of cocaine and anesthesia may have negative outcomes, up to and including death. If you test positive for cocaine, your surgery will be cancelled.  On the morning of surgery brush your teeth with toothpaste and water, you may rinse your mouth with mouthwash if you wish. Do not swallow any toothpaste or mouthwash.  Use CHG Soap as directed on instruction sheet.  Do not wear jewelry, make-up, hairpins, clips or nail polish.  Do not wear lotions, powders, or perfumes.   Do not shave body from the neck down 48 hours prior to surgery just in case you cut yourself which could leave a site for infection.  Also, freshly shaved skin may become irritated if using the CHG soap.  Contact lenses, hearing aids and dentures may not be worn into surgery.  Do not bring valuables to the hospital. [facility] is not responsible for any missing/lost belongings or valuables.   Notify your doctor if there is any change in your medical condition (cold, fever, infection).  Wear comfortable clothing (specific to your surgery type) to the hospital.  After surgery, you can help prevent  lung complications by doing breathing exercises.  Take deep breaths and cough every 1-2 hours. Your doctor may order a device called an Incentive Spirometer to help you take deep breaths.  If you are being discharged the day of surgery, you will not be allowed to drive home. You will need a responsible adult (18 years or older) to drive you home and stay with you that night.   If you are taking public transportation, you will need to  have a responsible adult (18 years or older) with you. Please confirm with your physician that it is acceptable to use public transportation.   Please call the Pre-admissions Testing Dept. at [phone] if you have any questions about these instructions.  Surgery Visitation Policy:  Patients undergoing a surgery or procedure may have two family members or support persons with them as long as the person is not COVID-19 positive or experiencing its symptoms.      Preparing for Surgery with CHLORHEXIDINE GLUCONATE (CHG) Soap  Chlorhexidine Gluconate (CHG) Soap  o An antiseptic cleaner that kills germs and bonds with the skin to continue killing germs even after washing  o Used for showering the night before surgery and morning of surgery  Before surgery, you can play an important role by reducing the number of germs on your skin.  CHG (Chlorhexidine gluconate) soap is an antiseptic cleanser which kills germs and bonds with the skin to continue killing germs even after washing.  Please do not use if you have an allergy to CHG or antibacterial soaps. If your skin becomes reddened/irritated stop using the CHG.  1. Shower the NIGHT BEFORE SURGERY and the MORNING OF SURGERY with CHG soap.  2. If you choose to wash your hair, wash your hair first as usual with your normal shampoo.  3. After shampooing, rinse your hair and body thoroughly to remove the shampoo.  4. Use CHG as you would any other liquid soap. You can apply CHG directly to the skin and wash gently with a scrungie or a clean washcloth.  5. Apply the CHG soap to your body only from the neck down. Do not use on open wounds or open sores. Avoid contact with your eyes, ears, mouth, and genitals (private parts). Wash face and genitals (private parts) with your normal soap.  6. Wash thoroughly, paying special attention to the area where your surgery will be performed.  7. Thoroughly rinse your body with warm water.  8. Do not  shower/wash with your normal soap after using and rinsing off the CHG soap.  9. Pat yourself dry with a clean towel.  10. Wear clean pajamas to bed the night before surgery.  12. Place clean sheets on your bed the night of your first shower and do not sleep with pets.  13. Shower again with the CHG soap on the day of surgery prior to arriving at the hospital.  14. Do not apply any deodorants/lotions/powders.  15. Please wear clean clothes to the hospital.

## 2022-11-11 ENCOUNTER — Other Ambulatory Visit: Payer: Self-pay

## 2022-11-11 ENCOUNTER — Ambulatory Visit
Admission: RE | Admit: 2022-11-11 | Discharge: 2022-11-11 | Disposition: A | Discharge status: Home / Self Care | Payer: 59 | Attending: Surgery | Admitting: Surgery

## 2022-11-11 ENCOUNTER — Ambulatory Visit: Payer: 59 | Admitting: Urgent Care

## 2022-11-11 ENCOUNTER — Ambulatory Visit: Payer: 59 | Admitting: Certified Registered"

## 2022-11-11 ENCOUNTER — Encounter
Admission: RE | Disposition: A | Discharge status: Home / Self Care | Payer: Self-pay | Source: Home / Self Care | Attending: Surgery

## 2022-11-11 ENCOUNTER — Encounter: Payer: Self-pay | Admitting: Surgery

## 2022-11-11 DIAGNOSIS — M94262 Chondromalacia, left knee: Secondary | ICD-10-CM | POA: Diagnosis not present

## 2022-11-11 DIAGNOSIS — Z87891 Personal history of nicotine dependence: Secondary | ICD-10-CM | POA: Diagnosis not present

## 2022-11-11 DIAGNOSIS — M1732 Unilateral post-traumatic osteoarthritis, left knee: Secondary | ICD-10-CM | POA: Insufficient documentation

## 2022-11-11 DIAGNOSIS — M23262 Derangement of other lateral meniscus due to old tear or injury, left knee: Secondary | ICD-10-CM | POA: Diagnosis not present

## 2022-11-11 DIAGNOSIS — M23222 Derangement of posterior horn of medial meniscus due to old tear or injury, left knee: Secondary | ICD-10-CM | POA: Insufficient documentation

## 2022-11-11 DIAGNOSIS — Z96651 Presence of right artificial knee joint: Secondary | ICD-10-CM | POA: Diagnosis not present

## 2022-11-11 DIAGNOSIS — R69 Illness, unspecified: Secondary | ICD-10-CM | POA: Diagnosis not present

## 2022-11-11 DIAGNOSIS — I471 Supraventricular tachycardia, unspecified: Secondary | ICD-10-CM | POA: Diagnosis not present

## 2022-11-11 DIAGNOSIS — M65862 Other synovitis and tenosynovitis, left lower leg: Secondary | ICD-10-CM | POA: Diagnosis not present

## 2022-11-11 DIAGNOSIS — S83232A Complex tear of medial meniscus, current injury, left knee, initial encounter: Secondary | ICD-10-CM | POA: Diagnosis not present

## 2022-11-11 DIAGNOSIS — S83272A Complex tear of lateral meniscus, current injury, left knee, initial encounter: Secondary | ICD-10-CM | POA: Diagnosis not present

## 2022-11-11 HISTORY — PX: KNEE ARTHROSCOPY: SHX127

## 2022-11-11 SURGERY — ARTHROSCOPY, KNEE
Anesthesia: General | Site: Knee | Laterality: Left

## 2022-11-11 MED ORDER — ACETAMINOPHEN 500 MG PO TABS
ORAL_TABLET | ORAL | Status: AC
  Administered 2022-11-11: 1000 mg via ORAL
  Filled 2022-11-11: qty 2

## 2022-11-11 MED ORDER — BUPIVACAINE-EPINEPHRINE (PF) 0.5% -1:200000 IJ SOLN
INTRAMUSCULAR | Status: AC
  Filled 2022-11-11: qty 30

## 2022-11-11 MED ORDER — FAMOTIDINE 20 MG PO TABS: 20.0000 mg | ORAL_TABLET | Freq: Once | ORAL | Status: AC

## 2022-11-11 MED ORDER — SERTRALINE HCL 50 MG PO TABS: 50.0000 mg | ORAL_TABLET | Freq: Every day | ORAL | Status: AC

## 2022-11-11 MED ORDER — ONDANSETRON HCL 4 MG PO TABS: 4.0000 mg | ORAL_TABLET | Freq: Four times a day (QID) | ORAL | Status: DC | PRN

## 2022-11-11 MED ORDER — PROPOFOL 10 MG/ML IV BOLUS
INTRAVENOUS | Status: AC
  Filled 2022-11-11: qty 20

## 2022-11-11 MED ORDER — CHLORHEXIDINE GLUCONATE 0.12 % MT SOLN
OROMUCOSAL | Status: AC
  Administered 2022-11-11: 15 mL via OROMUCOSAL
  Filled 2022-11-11: qty 15

## 2022-11-11 MED ORDER — ONDANSETRON HCL 4 MG/2ML IJ SOLN
INTRAMUSCULAR | Status: AC
  Administered 2022-11-11: 4 mg via INTRAVENOUS
  Filled 2022-11-11: qty 2

## 2022-11-11 MED ORDER — GLYCOPYRROLATE 0.2 MG/ML IJ SOLN
INTRAMUSCULAR | Status: DC | PRN
  Administered 2022-11-11: .2 mg via INTRAVENOUS

## 2022-11-11 MED ORDER — ONDANSETRON 4 MG PO TBDP: 4.0000 mg | ORAL_TABLET | Freq: Three times a day (TID) | ORAL | 1 refills | Status: AC | PRN

## 2022-11-11 MED ORDER — FENTANYL CITRATE (PF) 100 MCG/2ML IJ SOLN
25.0000 ug | INTRAMUSCULAR | Status: DC | PRN
  Administered 2022-11-11 (×2): 25 ug via INTRAVENOUS

## 2022-11-11 MED ORDER — CEFAZOLIN SODIUM-DEXTROSE 2-3 GM-%(50ML) IV SOLR
INTRAVENOUS | Status: DC | PRN
  Administered 2022-11-11: 2 g via INTRAVENOUS

## 2022-11-11 MED ORDER — HYDROCODONE-ACETAMINOPHEN 5-325 MG PO TABS: 1.0000 | ORAL_TABLET | Freq: Four times a day (QID) | ORAL | 0 refills | Status: DC | PRN

## 2022-11-11 MED ORDER — HYDROMORPHONE HCL 1 MG/ML IJ SOLN
INTRAMUSCULAR | Status: AC
  Filled 2022-11-11: qty 1

## 2022-11-11 MED ORDER — FENTANYL CITRATE (PF) 100 MCG/2ML IJ SOLN
INTRAMUSCULAR | Status: AC
  Filled 2022-11-11: qty 2

## 2022-11-11 MED ORDER — ONDANSETRON 4 MG PO TBDP: 4.0000 mg | ORAL_TABLET | Freq: Three times a day (TID) | ORAL | 1 refills | Status: DC | PRN

## 2022-11-11 MED ORDER — ONDANSETRON HCL 4 MG/2ML IJ SOLN: 4.0000 mg | Freq: Four times a day (QID) | INTRAMUSCULAR | Status: DC | PRN

## 2022-11-11 MED ORDER — RINGERS IRRIGATION IR SOLN
Status: DC | PRN
  Administered 2022-11-11: 3000 mL

## 2022-11-11 MED ORDER — BUPIVACAINE-EPINEPHRINE (PF) 0.5% -1:200000 IJ SOLN
INTRAMUSCULAR | Status: DC | PRN
  Administered 2022-11-11 (×2): 30 mL

## 2022-11-11 MED ORDER — DEXAMETHASONE SODIUM PHOSPHATE 10 MG/ML IJ SOLN
INTRAMUSCULAR | Status: DC | PRN
  Administered 2022-11-11: 8 mg via INTRAVENOUS

## 2022-11-11 MED ORDER — LIDOCAINE HCL 1 % IJ SOLN
INTRAMUSCULAR | Status: DC | PRN
  Administered 2022-11-11: 30 mL

## 2022-11-11 MED ORDER — KETOROLAC TROMETHAMINE 30 MG/ML IJ SOLN
30.0000 mg | Freq: Once | INTRAMUSCULAR | Status: AC
  Administered 2022-11-11: 30 mg via INTRAVENOUS

## 2022-11-11 MED ORDER — SUCCINYLCHOLINE CHLORIDE 200 MG/10ML IV SOSY
PREFILLED_SYRINGE | INTRAVENOUS | Status: DC | PRN
  Administered 2022-11-11: 140 mg via INTRAVENOUS

## 2022-11-11 MED ORDER — PROPOFOL 1000 MG/100ML IV EMUL
INTRAVENOUS | Status: AC
  Filled 2022-11-11: qty 100

## 2022-11-11 MED ORDER — OXYCODONE HCL 5 MG/5ML PO SOLN: 5.0000 mg | Freq: Once | ORAL | Status: AC | PRN

## 2022-11-11 MED ORDER — FENTANYL CITRATE (PF) 100 MCG/2ML IJ SOLN
INTRAMUSCULAR | Status: AC
  Administered 2022-11-11: 50 ug via INTRAVENOUS
  Filled 2022-11-11: qty 2

## 2022-11-11 MED ORDER — METOCLOPRAMIDE HCL 5 MG/ML IJ SOLN: 5.0000 mg | Freq: Three times a day (TID) | INTRAMUSCULAR | Status: DC | PRN

## 2022-11-11 MED ORDER — PROPOFOL 10 MG/ML IV BOLUS
INTRAVENOUS | Status: DC | PRN
  Administered 2022-11-11: 170 mg via INTRAVENOUS

## 2022-11-11 MED ORDER — FENTANYL CITRATE (PF) 100 MCG/2ML IJ SOLN
INTRAMUSCULAR | Status: DC | PRN
  Administered 2022-11-11 (×4): 50 ug via INTRAVENOUS

## 2022-11-11 MED ORDER — ONDANSETRON HCL 4 MG/2ML IJ SOLN
INTRAMUSCULAR | Status: DC | PRN
  Administered 2022-11-11: 4 mg via INTRAVENOUS

## 2022-11-11 MED ORDER — ORAL CARE MOUTH RINSE: 15.0000 mL | Freq: Once | OROMUCOSAL | Status: AC

## 2022-11-11 MED ORDER — HYDROCODONE-ACETAMINOPHEN 5-325 MG PO TABS: 1.0000 | ORAL_TABLET | Freq: Four times a day (QID) | ORAL | 0 refills | Status: AC | PRN

## 2022-11-11 MED ORDER — ACETAMINOPHEN 500 MG PO TABS: 1000.0000 mg | ORAL_TABLET | ORAL | Status: AC

## 2022-11-11 MED ORDER — HYDROMORPHONE HCL 1 MG/ML IJ SOLN
0.2500 mg | INTRAMUSCULAR | Status: DC | PRN
  Administered 2022-11-11: 0.5 mg via INTRAVENOUS

## 2022-11-11 MED ORDER — PROPOFOL 500 MG/50ML IV EMUL
INTRAVENOUS | Status: DC | PRN
  Administered 2022-11-11: 120 ug/kg/min via INTRAVENOUS

## 2022-11-11 MED ORDER — METOCLOPRAMIDE HCL 10 MG PO TABS: 5.0000 mg | ORAL_TABLET | Freq: Three times a day (TID) | ORAL | Status: DC | PRN

## 2022-11-11 MED ORDER — PHENYLEPHRINE HCL (PRESSORS) 10 MG/ML IV SOLN
INTRAVENOUS | Status: DC | PRN
  Administered 2022-11-11 (×2): 80 ug via INTRAVENOUS

## 2022-11-11 MED ORDER — LIDOCAINE HCL (PF) 1 % IJ SOLN
INTRAMUSCULAR | Status: AC
  Filled 2022-11-11: qty 30

## 2022-11-11 MED ORDER — DEXMEDETOMIDINE HCL IN NACL 80 MCG/20ML IV SOLN
INTRAVENOUS | Status: DC | PRN
  Administered 2022-11-11: 12 ug via BUCCAL
  Administered 2022-11-11: 8 ug via BUCCAL

## 2022-11-11 MED ORDER — CHLORHEXIDINE GLUCONATE 0.12 % MT SOLN: 15.0000 mL | Freq: Once | OROMUCOSAL | Status: AC

## 2022-11-11 MED ORDER — MIDAZOLAM HCL 2 MG/2ML IJ SOLN
INTRAMUSCULAR | Status: DC | PRN
  Administered 2022-11-11: 2 mg via INTRAVENOUS

## 2022-11-11 MED ORDER — HYDROCODONE-ACETAMINOPHEN 5-325 MG PO TABS: 1.0000 | ORAL_TABLET | ORAL | Status: DC | PRN

## 2022-11-11 MED ORDER — ROCURONIUM BROMIDE 100 MG/10ML IV SOLN
INTRAVENOUS | Status: DC | PRN
  Administered 2022-11-11: 10 mg via INTRAVENOUS
  Administered 2022-11-11: 30 mg via INTRAVENOUS
  Administered 2022-11-11: 20 mg via INTRAVENOUS

## 2022-11-11 MED ORDER — LIDOCAINE HCL (CARDIAC) PF 100 MG/5ML IV SOSY
PREFILLED_SYRINGE | INTRAVENOUS | Status: DC | PRN
  Administered 2022-11-11: 100 mg via INTRAVENOUS

## 2022-11-11 MED ORDER — OXYCODONE HCL 5 MG PO TABS: 5.0000 mg | ORAL_TABLET | Freq: Once | ORAL | Status: AC | PRN

## 2022-11-11 MED ORDER — OXYCODONE HCL 5 MG PO TABS
ORAL_TABLET | ORAL | Status: AC
  Administered 2022-11-11: 5 mg via ORAL
  Filled 2022-11-11: qty 1

## 2022-11-11 MED ORDER — LACTATED RINGERS IV SOLN: INTRAVENOUS | Status: DC

## 2022-11-11 MED ORDER — KETOROLAC TROMETHAMINE 30 MG/ML IJ SOLN
INTRAMUSCULAR | Status: AC
  Filled 2022-11-11: qty 1

## 2022-11-11 MED ORDER — MIDAZOLAM HCL 2 MG/2ML IJ SOLN
INTRAMUSCULAR | Status: AC
  Filled 2022-11-11: qty 2

## 2022-11-11 MED ORDER — FAMOTIDINE 20 MG PO TABS
ORAL_TABLET | ORAL | Status: AC
  Administered 2022-11-11: 20 mg via ORAL
  Filled 2022-11-11: qty 1

## 2022-11-11 SURGICAL SUPPLY — 35 items
BLADE FULL RADIUS 3.5 (BLADE) ×1 IMPLANT
BLADE SHAVER 4.5X7 STR FR (MISCELLANEOUS) ×1 IMPLANT
BNDG ELASTIC 6X5.8 VLCR STR LF (GAUZE/BANDAGES/DRESSINGS) ×1 IMPLANT
BNDG ESMARK 6X12 TAN STRL LF (GAUZE/BANDAGES/DRESSINGS) ×1 IMPLANT
CATH ROBINSON RED A/P 12FR (CATHETERS) IMPLANT
CHLORAPREP W/TINT 26 (MISCELLANEOUS) ×1 IMPLANT
COLLECTOR GRAFT TISSUE (SYSTAGENIX WOUND MANAGEMENT) ×1
CUP MEDICINE 2OZ PLAST GRAD ST (MISCELLANEOUS) ×1 IMPLANT
DRAPE ARTHRO LIMB 89X125 STRL (DRAPES) ×1 IMPLANT
DRAPE IMP U-DRAPE 54X76 (DRAPES) ×1 IMPLANT
ELECT REM PT RETURN 9FT ADLT (ELECTROSURGICAL) ×1
ELECTRODE REM PT RTRN 9FT ADLT (ELECTROSURGICAL) ×1 IMPLANT
GAUZE SPONGE 4X4 12PLY STRL (GAUZE/BANDAGES/DRESSINGS) ×1 IMPLANT
GLOVE BIO SURGEON STRL SZ8 (GLOVE) ×2 IMPLANT
GLOVE SURG UNDER LTX SZ8 (GLOVE) ×1 IMPLANT
GOWN STRL REUS W/ TWL LRG LVL3 (GOWN DISPOSABLE) ×1 IMPLANT
GOWN STRL REUS W/ TWL XL LVL3 (GOWN DISPOSABLE) ×2 IMPLANT
GOWN STRL REUS W/TWL LRG LVL3 (GOWN DISPOSABLE) ×1
GOWN STRL REUS W/TWL XL LVL3 (GOWN DISPOSABLE) ×2
IV LACTATED RINGER IRRG 3000ML (IV SOLUTION) ×1
IV LR IRRIG 3000ML ARTHROMATIC (IV SOLUTION) ×1 IMPLANT
KIT TURNOVER KIT A (KITS) ×1 IMPLANT
MANIFOLD NEPTUNE II (INSTRUMENTS) ×2 IMPLANT
NDL HYPO 21X1.5 SAFETY (NEEDLE) ×1 IMPLANT
NEEDLE HYPO 21X1.5 SAFETY (NEEDLE) ×1 IMPLANT
PACK ARTHROSCOPY KNEE (MISCELLANEOUS) ×1 IMPLANT
SPONGE T-LAP 18X18 ~~LOC~~+RFID (SPONGE) ×1 IMPLANT
SUT PROLENE 4 0 PS 2 18 (SUTURE) ×1 IMPLANT
SYR 30ML LL (SYRINGE) ×1 IMPLANT
SYR 50ML LL SCALE MARK (SYRINGE) ×1 IMPLANT
SYR TOOMEY 50ML (SYRINGE) IMPLANT
TISSUE GRAFT COLLECTOR (SYSTAGENIX WOUND MANAGEMENT) IMPLANT
TUBING CONNECTING 10 (TUBING) IMPLANT
TUBING INFLOW SET DBFLO PUMP (TUBING) ×1 IMPLANT
WAND WEREWOLF FLOW 90D (MISCELLANEOUS) ×1 IMPLANT

## 2022-11-11 NOTE — H&P (Signed)
History of Present Illness: Brenda Sellers is a 41 y.o. who presents today for history and physical. She is undergoing left knee arthroscopy with debridement and partial right medial and lateral meniscectomy on 11/11/2022. Since the patient was last here her symptoms have not changed. Patient states that her symptoms have continued to the point they are interfering with her activities of daily living and wishes to proceed with surgery.  The patient has a history of left knee ACL reconstruction with repair of bucket-handle medial meniscus tear performed by Dr. Ernest Pine in 2000. The patient does report several other surgical interventions for her left knee however the only documentation I can find in our system is the history of the ACL reconstruction. It does appear that the patient has a history of several right knee surgeries concluding with a right total knee arthroplasty performed in 2019. The patient was in her usual state of health when she was walking and she believes her left knee either buckled or she slipped causing her to fall and flex her left knee underneath her body and falling onto her left leg. This injury occurred on the Friday before Easter, 02/28/2022. The patient did not feel a pop at the time of the injury but when she got up she had significant increase in her left knee pain. This prompted her to be evaluated at the [facility] emergency room on 03/03/22 where x-rays of the left knee were obtained and are detailed in the imaging section below. The patient was placed in a knee immobilizer and was given crutches for assistance with ambulation. The patient presents today using crutches for assistance with ambulation. The patient has been placing weight onto her left knee recently and does state that this does increase her pain but she feels that she is able to place more more weight onto her left knee over the past several days. The patient does take tramadol chronically and has also been using Voltaren gel.  She denies any open wound or erythema after the injury, she does report significant swelling at the time of the injury. The patient has pain primarily along the anterior and medial aspect the left knee, worse with attempted knee flexion. She denies any catching or locking symptoms in the left knee at today's visit. The patient does report a sharp discomfort when she is bending her knee. She denies any numbness or tingling to the left lower extremity at today's visit.   The patient was treated conservatively. She then followed back up with Dr. Joice Lofts on 09/22/2022 left knee pain secondary to MRI documented medial and lateral meniscal tears with underlying degenerative joint disease status post a prior ACL reconstruction. The patient was last seen for these symptoms over 3 months ago. At that time, the patient wished to avoid surgical intervention so a steroid injection was administered which she states provided moderate relief of her symptoms, lasting over 6 weeks before her symptoms began to recur. She followed up with Horris Latino, PA-C, requesting a repeat injection which was administered. However, she notes little improvement in her symptoms following the second injection. Therefore, she was advised to follow-up with me again to discuss further treatment options. The patient rates her pain at 9/10 on today's visit. She has been taking tramadol and Zanaflex as necessary with limited benefit. Her pain is worse with any prolonged standing or ambulation. She also has pain at night. She denies any reinjury to her left knee, and denies any numbness or paresthesias down her leg to her  foot.  Past Medical History: History of chickenpox  Idiopathic lumbosacral plexus neuropathy 12/23/2016  Migraines  Pericardial cyst 06/01/2013   Past Surgical History Past Surgical History:  Left ACL reconstruction and repair of bucket-handle tear of the left medial meniscus. Left 08/09/1999 (Dr. Ernest Pine)  Cholecystecomy 2005   Arthroscopic lateral release, open medial retinacular reefing, partial lateral meniscectomy and microfracture chondroplasty of the lateral femoral condyle Right 07/04/2010 (Dr. Rosita Kea)  OPEN REPAIR PERIARTICULAR FRACTURE / DISLOCATION ELBOW Left 2015  HYSTERECTOMY 07/04/2014  Right knee arthroscopic partial lateral meniscectomy and debridement chondromalacia grade 3 from the posterior aspect of the lateral tibial plateau and lateral facet of the patella, As well as the lateral aspect of the distal femur focally. Right 04/10/2017  Gean Birchwood, MD  RIGHT TOTAL KNEE ARTHROPLASTY Using DepuyAttune RP implants #[id number] Femur, #5Tibia, 7 mm Attune RP bearing, 38 Patella Right 12/07/2017 Turner Daniels, Homero Fellers, MD)  TONSILLECTOMY age 41   Past Family History: Ovarian cancer Mother  Esophageal cancer Father  Diabetes Father  High blood pressure (Hypertension) Father  Breast cancer Sister  Polycystic ovary syndrome Sister  Diabetes Sister  Breast cancer Maternal Aunt  Myocardial Infarction (Heart attack) Maternal Grandfather  Cholelithiasis Other  High blood pressure (Hypertension) Other  Liver cancer Other  Diabetes Other  Diabetes Other  Diabetes Other   Medications: acetaminophen (TYLENOL) 500 MG tablet Take 1,000 mg by mouth as needed for Pain  ALPRAZolam (XANAX) 0.5 MG tablet Take 1 tablet (0.5 mg total) by mouth 2 (two) times daily as needed for Sleep 60 tablet 5  sertraline (ZOLOFT) 50 MG tablet Take 1 tablet (50 mg total) by mouth once daily 90 tablet 3  tiZANidine (ZANAFLEX) 4 MG tablet Take 1 tablet (4 mg total) by mouth 3 (three) times daily as needed for Muscle spasms 90 tablet 1  traMADoL (ULTRAM) 50 mg tablet Take 1 tablet (50 mg total) by mouth 4 (four) times daily 120 tablet 5  zolpidem (AMBIEN) 10 mg tablet Take 1 tablet (10 mg total) by mouth at bedtime as needed for Sleep 30 tablet 5   Allergies: Augmentin [Amoxicillin-Pot Clavulanate] Rash (Skin color change- purplish spots)   Codeine Sulfate Vomiting  Corn Other (Issues with diverticulosis)  Vicodin [Hydrocodone-Acetaminophen] Nausea   Review of Systems: A comprehensive 14 point ROS was performed, reviewed, and the pertinent orthopaedic findings are documented in the HPI.  Physical Exam: BP 112/80  Ht 172.7 cm (5\' 8" )  Wt 92.8 kg (204 lb 9.6 oz)  BMI 31.11 kg/m   General: Well-developed well-nourished female seen in no acute distress.   HEENT: Atraumatic,normocephalic. Pupils are equal and reactive to light. Oropharynx is clear with moist mucosa  Lungs: Clear to auscultation bilaterally   Cardiovascular: Regular rate and rhythm. Normal S1, S2. No murmurs. No appreciable gallops or rubs. Peripheral pulses are palpable.  Abdomen: Soft, non-tender, nondistended. Bowel sounds present  Left knee exam: GAIT: Mild-moderate limp, but uses no assistive devices. ALIGNMENT: Normal SKIN: Well-healed surgical incision, otherwise unremarkable SWELLING: Mild EFFUSION: Trace WARMTH: None TENDERNESS: Mild-moderate tenderness along medial more so than lateral joint lines ROM: 0-130 degrees with mild pain in maximal flexion McMURRAY'S: Equivocal positive PATELLOFEMORAL: Normal tracking with no peri-patellar tenderness and negative apprehension sign CREPITUS: Mild patellofemoral crepitance LACHMAN'S: Negative PIVOT SHIFT: Negative ANTERIOR DRAWER: Negative POSTERIOR DRAWER: Negative VARUS/VALGUS: Stable  Neurological: The patient is alert and oriented Sensation to light touch appears to be intact and within normal limits Gross motor strength appeared to be equal to 5/5  Vascular :  Peripheral pulses felt to be palpable. Capillary refill appears to be intact and within normal limits  X-ray: A recent MRI of the left knee demonstrates the previously placed ACL graft to be in good condition and in good position. There is complex tearing of the posterior portions of both the medial and lateral menisci.  There also is evidence of tricompartmental degenerative changes with focal areas of full-thickness cartilage loss involving the lateral femoral condyle. The MRI scan was reviewed again by myself at today's visit.   Impression: 1. Complex tears of medial and lateral meniscus, left knee. 2. Posttraumatic osteoarthritis left knee.  Plan:     H&P reviewed and patient re-examined. No changes.

## 2022-11-11 NOTE — Anesthesia Preprocedure Evaluation (Addendum)
Anesthesia Evaluation  Patient identified by MRN, date of birth, ID band Patient awake    Reviewed: Allergy & Precautions, NPO status , Patient's Chart, lab work & pertinent test results  History of Anesthesia Complications (+) PONV and history of anesthetic complications  Airway Mallampati: III  TM Distance: <3 FB Neck ROM: full    Dental  (+) Chipped, Poor Dentition   Pulmonary neg shortness of breath, former smoker   Pulmonary exam normal        Cardiovascular Exercise Tolerance: Good (-) angina negative cardio ROS Normal cardiovascular exam     Neuro/Psych  Headaches PSYCHIATRIC DISORDERS       Neuromuscular disease    GI/Hepatic Neg liver ROS,GERD  Controlled,,  Endo/Other  negative endocrine ROS    Renal/GU      Musculoskeletal   Abdominal   Peds  Hematology negative hematology ROS (+)   Anesthesia Other Findings Past Medical History: 04/29/2018: Acute diverticulitis No date: Anxiety No date: Arthritis No date: Complication of anesthesia No date: Diverticulitis No date: GERD (gastroesophageal reflux disease)     Comment:  OCC No date: Headache 11/10/2013: Hypotension 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 No date: PSVT (paroxysmal supraventricular tachycardia)  Past Surgical History: 07/04/2014: ABDOMINAL HYSTERECTOMY 1998: ANTERIOR CRUCIATE LIGAMENT REPAIR; Left     Comment:  Menicus, PCL Screw 2005: CHOLECYSTECTOMY 04/10/2017: CHONDROPLASTY; Right     Comment:  Procedure: CHONDROPLASTY;  Surgeon: Gean Birchwood, MD;                Location: [facility];  Service:               Orthopedics;  Laterality: Right; No date: JOINT REPLACEMENT 07/04/2010: KNEE  ARTHROSCOPY; Right     Comment:  Menicus 04/10/2017: KNEE ARTHROSCOPY; Right     Comment:  Procedure: ARTHROSCOPY KNEE;  Surgeon: Gean Birchwood, MD;              Location: [facility];  Service:               Orthopedics;  Laterality: Right; 04/10/2017: KNEE ARTHROSCOPY WITH LATERAL MENISECTOMY; Right     Comment:  Procedure: KNEE ARTHROSCOPY WITH  PARTIAL LATERAL               MENISECTOMY;  Surgeon: Gean Birchwood, MD;  Location: [facility];  Service: Orthopedics;  Laterality:               Right; 07/04/2014: LAPAROSCOPIC TOTAL HYSTERECTOMY 04/04/2015: ORIF ELBOW FRACTURE; Left     Comment:  Procedure: OPEN REDUCTION INTERNAL FIXATION (ORIF) LEFT               ELBOW/OLECRANON FRACTURE;  Surgeon: Bradly Bienenstock, MD;                Location: [facility] OR;  Service: Orthopedics;  Laterality:               Left; No date: right total knee replacement 1993: TONSILLECTOMY 12/07/2017: TOTAL KNEE ARTHROPLASTY; Right     Comment:  Procedure: RIGHT TOTAL KNEE ARTHROPLASTY;  Surgeon:  Gean Birchwood, MD;  Location: [facility] OR;  Service:               Orthopedics;  Laterality: Right;  BMI    Body Mass Index: 28.06 kg/m      Reproductive/Obstetrics negative OB ROS                             Anesthesia Physical Anesthesia Plan  ASA: 3  Anesthesia Plan: General ETT, Rapid Sequence and Cricoid Pressure   Post-op Pain Management:    Induction: Intravenous  PONV Risk Score and Plan: Ondansetron, Dexamethasone, Midazolam, Treatment may vary due to age or medical condition, Propofol infusion and TIVA  Airway Management Planned: Oral ETT  Additional Equipment:   Intra-op Plan:   Post-operative Plan: Extubation in OR  Informed Consent: I have reviewed the patients History and Physical, chart, labs and discussed the procedure including the risks, benefits and alternatives for the proposed anesthesia with the patient or  authorized representative who has indicated his/her understanding and acceptance.     Dental Advisory Given  Plan Discussed with: Anesthesiologist, CRNA and Surgeon  Anesthesia Plan Comments: (Patient consented for risks of anesthesia including but not limited to:  - adverse reactions to medications - damage to eyes, teeth, lips or other oral mucosa - nerve damage due to positioning  - sore throat or hoarseness - Damage to heart, brain, nerves, lungs, other parts of body or loss of life  Patient voiced understanding.)       Anesthesia Quick Evaluation

## 2022-11-11 NOTE — Anesthesia Postprocedure Evaluation (Signed)
Anesthesia Post Note  Patient: Brenda Sellers  Procedure(s) Performed: LEFT KNEE ARTHROSCOPY WITH DEBRIDEMENT AND PARTIAL MEDIAL AND LATERAL MENISCECTOMIES. (Left: Knee)  Patient location during evaluation: PACU Anesthesia Type: General Level of consciousness: awake and alert Pain management: pain level controlled Vital Signs Assessment: post-procedure vital signs reviewed and stable Respiratory status: spontaneous breathing, nonlabored ventilation, respiratory function stable and patient connected to nasal cannula oxygen Cardiovascular status: blood pressure returned to baseline and stable Postop Assessment: no apparent nausea or vomiting Anesthetic complications: no   No notable events documented.   Last Vitals:  Vitals:   11/11/22 1830 11/11/22 1904  BP: 138/89 121/72  Pulse: 74 87  Resp: 16 18  Temp: 37 C 36.9 C  SpO2: 100% 100%    Last Pain:  Vitals:   11/11/22 1904  TempSrc: Temporal  PainSc: 5                  Cleda Mccreedy Tobey Schmelzle

## 2022-11-11 NOTE — Discharge Instructions (Addendum)
Orthopedic discharge instructions: Keep dressing dry and intact.  May shower after dressing changed on post-op day #4 (Saturday).  Cover sutures with Band-Aids after drying off. Apply ice frequently to knee. Take ibuprofen 600-800 mg TID with meals for 5-7 days, then as necessary. Take pain medication as prescribed or ES Tylenol when needed.  May toe-touch weight-bear on left leg only - use crutches or walker for balance and support. Follow-up in 10-14 days or as scheduled.   AMBULATORY SURGERY  DISCHARGE INSTRUCTIONS   The drugs that you were given will stay in your system until tomorrow so for the next 24 hours you should not:  Drive an automobile Make any legal decisions Drink any alcoholic beverage   You may resume regular meals tomorrow.  Today it is better to start with liquids and gradually work up to solid foods.  You may eat anything you prefer, but it is better to start with liquids, then soup and crackers, and gradually work up to solid foods.   Please notify your doctor immediately if you have any unusual bleeding, trouble breathing, redness and pain at the surgery site, drainage, fever, or pain not relieved by medication.    Additional Instructions:    Please contact your physician with any problems or Same Day Surgery at [phone], Monday through Friday 6 am to 4 pm, or [place] at [facility] number at [phone].

## 2022-11-11 NOTE — Transfer of Care (Signed)
Immediate Anesthesia Transfer of Care Note  Patient: Brenda Sellers  Procedure(s) Performed: LEFT KNEE ARTHROSCOPY WITH DEBRIDEMENT AND PARTIAL MEDIAL AND LATERAL MENISCECTOMIES. (Left: Knee)  Patient Location: PACU  Anesthesia Type:General  Level of Consciousness: drowsy  Airway & Oxygen Therapy: Patient Spontanous Breathing and Patient connected to face mask oxygen  Post-op Assessment: Report given to RN  Post vital signs: stable  Last Vitals:  Vitals Value Taken Time  BP 131/92 11/11/22 1736  Temp 36.4 C 11/11/22 1736  Pulse 81 11/11/22 1741  Resp 11 11/11/22 1741  SpO2 100 % 11/11/22 1741  Vitals shown include unvalidated device data.  Last Pain:  Vitals:   11/11/22 1505  TempSrc: Temporal  PainSc: 8          Complications: No notable events documented.

## 2022-11-11 NOTE — Anesthesia Procedure Notes (Addendum)
Procedure Name: Intubation Date/Time: 11/11/2022 4:15 PM  Performed by: Jaye Beagle, CRNAPre-anesthesia Checklist: Patient identified, Emergency Drugs available, Suction available and Patient being monitored Patient Re-evaluated:Patient Re-evaluated prior to induction Oxygen Delivery Method: Circle system utilized Preoxygenation: Pre-oxygenation with 100% oxygen Induction Type: IV induction and Rapid sequence Laryngoscope Size: McGraph and 3 Grade View: Grade I Tube type: Oral Tube size: 7.0 mm Number of attempts: 1 Airway Equipment and Method: Stylet and Oral airway Placement Confirmation: ETT inserted through vocal cords under direct vision, positive ETCO2 and breath sounds checked- equal and bilateral Secured at: 22 cm Tube secured with: Tape Dental Injury: Teeth and Oropharynx as per pre-operative assessment

## 2022-11-11 NOTE — Op Note (Signed)
11/11/2022  5:40 PM  Patient:   Brenda Sellers  Pre-Op Diagnosis:   Posttraumatic degenerative joint disease with degenerative medial lateral meniscal tears status post prior ACL reconstruction, left knee.  Postoperative diagnosis:   Same with osteochondral lesion of lateral femoral condyle, left knee.  Procedure:   Extensive arthroscopic arthroscopic debridement/synovectomy, partial medial and lateral meniscectomies, abrasion chondroplasty with microfracture of the lateral femoral condylar lesion,, and reinjection of harvested fat cells, left knee.  Surgeon:   Maryagnes Amos, MD  Anesthesia:   GET  Findings:   As above. There was an area of grade IV chondromalacia on the lateral femoral condyle measuring approximately 2 x 3 cm with a central osseous defect measuring approximately 8 mm in diameter and 8 mm deep. There also was an area of grade I-II chondromalacia involving the central ridge of the patella. The remaining articular surfaces were in satisfactory condition. There was a comp tear of the posterior portion of the medial meniscus, as well as degenerative fraying of the central portion of the lateral meniscus. The ACL graft appeared to be in satisfactory condition, as did the PCL.  Complications:   None  EBL:   10 cc.  Total fluids:   500 cc of crystalloid.  Tourniquet time:   None  Drains:   None  Closure:   4-0 Prolene interrupted sutures.  Brief clinical note:   The patient is a 41 year old female who is now 21 years status post an ACL reconstruction from which she has done quite well. Over the past year or so, she has noted increased pain in her knee. Her history and examination worse is consistent with a degenerative medial lateral meniscus tears with progression of her posttraumatic osteoarthritis, all of which were confirmed by MRI scan. The patient presents at this time for arthroscopy, debridement, reinjection of fat cells, and partial medial and lateral  meniscectomies of her left knee.  Procedure:   The patient was brought into the operating room and lain in the supine position. After adequate general endotracheal intubation and anesthesia was obtained, a timeout was performed to verify the appropriate side. The patient's left knee was injected sterilely using a solution of 30 cc of 1% lidocaine and 30 cc of 0.5% Sensorcaine with epinephrine. The left lower extremity was prepped with ChloraPrep solution before being draped sterilely. Preoperative antibiotics were administered. The expected portal sites were injected with 0.5% Sensorcaine with epinephrine before the camera was placed in the anterolateral portal and instrumentation performed through the anteromedial portal.   The knee was sequentially examined beginning in the suprapatellar pouch, then progressing to the patellofemoral space, the medial gutter and compartment, the notch, and finally the lateral compartment and gutter. The findings were as described above. Abundant reactive synovial tissues anteriorly, medially, and laterally were debrided using the full-radius resector (which were then collected in the Arthrex GraftNet device and preserved for later reimplantation) in order to improve visualization.    The area of complex tearing involving the posterior portion of the medial meniscus was debrided back to stable margins using combination of the meniscal baskets and full-radius resector. Subsequent probing of the remaining rim demonstrated excellent stability. The instruments were removed from the joint after suctioning the excess fluid. The area of degenerative tearing along the central portion of the lateral meniscus also was debrided back to stable margins using the full-radius resector. Subsequent probing of the remaining rim again demonstrated excellent stability.  The area of grade IV chondromalacia involving the lateral  femoral condyle were debrided back to stable margins.  The exposed  subchondral bone was then perforated with an arthroscopic awl numerous times, separated by 4 to 5 mm.  Near the more midline portion of the defect, an area of necrotic bone was encountered.  Once this was removed, there was a defect measuring approximately 8-9 mm in diameter and 8 to 9 mm deep.  The fluid was stopped briefly to verify that adequate bleeding was achieved throughout this area.  The instruments were removed from the joint after suctioning the excess fluid. Using the short cannula to access the interior of the joint, the harvested fat cells were reinjected through the lateral portal site via a red rubber catheter into the joint before the portal sites were closed using 4-0 Prolene interrupted sutures. A sterile bulky dressing was applied to the knee. The patient was then awakened, extubated, and returned to the recovery room in satisfactory condition after tolerating the procedure well.

## 2022-11-12 ENCOUNTER — Encounter: Payer: Self-pay | Admitting: Surgery

## 2022-11-21 ENCOUNTER — Other Ambulatory Visit: Payer: Self-pay

## 2022-11-21 MED ORDER — ONDANSETRON 4 MG PO TBDP
4.0000 mg | ORAL_TABLET | Freq: Every day | ORAL | 1 refills | Status: DC
  Filled 2022-11-21: qty 30, 30d supply, fill #0
  Filled 2022-12-25: qty 18, 21d supply, fill #1
  Filled 2023-01-20: qty 12, 12d supply, fill #2

## 2022-11-21 MED ORDER — OXYCODONE-ACETAMINOPHEN 5-325 MG PO TABS
1.0000 | ORAL_TABLET | Freq: Four times a day (QID) | ORAL | 0 refills | Status: DC | PRN
  Filled 2022-11-21: qty 30, 7d supply, fill #0
  Filled 2022-11-21: qty 30, 8d supply, fill #0

## 2022-11-26 ENCOUNTER — Other Ambulatory Visit: Payer: Self-pay

## 2022-11-27 ENCOUNTER — Other Ambulatory Visit: Payer: Self-pay

## 2022-11-27 MED ORDER — OXYCODONE-ACETAMINOPHEN 5-325 MG PO TABS
1.0000 | ORAL_TABLET | Freq: Three times a day (TID) | ORAL | 0 refills | Status: DC | PRN
  Filled 2022-11-27: qty 30, 10d supply, fill #0

## 2022-11-28 ENCOUNTER — Other Ambulatory Visit: Payer: Self-pay

## 2022-11-28 MED ORDER — TRAMADOL HCL 50 MG PO TABS
50.0000 mg | ORAL_TABLET | Freq: Four times a day (QID) | ORAL | 2 refills | Status: DC
  Filled 2022-11-28: qty 120, 30d supply, fill #0
  Filled 2022-12-25 – 2022-12-28 (×2): qty 120, 30d supply, fill #1
  Filled 2023-01-20 – 2023-01-23 (×4): qty 120, 30d supply, fill #2

## 2022-11-28 MED ORDER — SERTRALINE HCL 50 MG PO TABS
50.0000 mg | ORAL_TABLET | Freq: Every day | ORAL | 0 refills | Status: AC
  Filled 2022-11-28: qty 30, 30d supply, fill #0
  Filled 2022-12-25: qty 30, 30d supply, fill #1
  Filled 2023-01-20: qty 30, 30d supply, fill #2

## 2022-12-05 ENCOUNTER — Ambulatory Visit
Admission: RE | Admit: 2022-12-05 | Discharge: 2022-12-05 | Disposition: A | Discharge status: Home / Self Care | Payer: 59 | Source: Ambulatory Visit | Attending: Student | Admitting: Student

## 2022-12-05 ENCOUNTER — Other Ambulatory Visit: Payer: Self-pay | Admitting: Student

## 2022-12-05 DIAGNOSIS — M7989 Other specified soft tissue disorders: Secondary | ICD-10-CM

## 2022-12-05 DIAGNOSIS — M1732 Unilateral post-traumatic osteoarthritis, left knee: Secondary | ICD-10-CM | POA: Diagnosis not present

## 2022-12-18 ENCOUNTER — Other Ambulatory Visit: Payer: Self-pay

## 2022-12-18 MED ORDER — TIZANIDINE HCL 4 MG PO TABS
4.0000 mg | ORAL_TABLET | Freq: Three times a day (TID) | ORAL | 1 refills | Status: AC | PRN
  Filled 2022-12-18: qty 90, 30d supply, fill #0
  Filled 2023-03-29: qty 90, 30d supply, fill #1

## 2022-12-18 MED ORDER — OXYCODONE-ACETAMINOPHEN 5-325 MG PO TABS
1.0000 | ORAL_TABLET | Freq: Three times a day (TID) | ORAL | 0 refills | Status: DC | PRN
  Filled 2022-12-18: qty 30, 10d supply, fill #0

## 2022-12-19 ENCOUNTER — Other Ambulatory Visit: Payer: Self-pay

## 2022-12-22 ENCOUNTER — Other Ambulatory Visit: Payer: Self-pay

## 2022-12-25 ENCOUNTER — Other Ambulatory Visit: Payer: Self-pay

## 2022-12-28 ENCOUNTER — Other Ambulatory Visit: Payer: Self-pay

## 2022-12-29 ENCOUNTER — Other Ambulatory Visit: Payer: Self-pay

## 2022-12-29 MED ORDER — TIZANIDINE HCL 4 MG PO TABS
4.0000 mg | ORAL_TABLET | Freq: Three times a day (TID) | ORAL | 1 refills | Status: DC | PRN
  Filled 2022-12-29 – 2023-01-20 (×2): qty 90, 30d supply, fill #0
  Filled 2023-02-24: qty 90, 30d supply, fill #1

## 2022-12-29 MED ORDER — OXYCODONE-ACETAMINOPHEN 5-325 MG PO TABS
1.0000 | ORAL_TABLET | Freq: Three times a day (TID) | ORAL | 0 refills | Status: DC | PRN
  Filled 2022-12-29: qty 30, 10d supply, fill #0

## 2023-01-13 ENCOUNTER — Other Ambulatory Visit: Payer: Self-pay | Admitting: Surgery

## 2023-01-13 DIAGNOSIS — M1612 Unilateral primary osteoarthritis, left hip: Secondary | ICD-10-CM

## 2023-01-20 ENCOUNTER — Other Ambulatory Visit: Payer: Self-pay

## 2023-01-21 ENCOUNTER — Ambulatory Visit
Admission: RE | Admit: 2023-01-21 | Discharge: 2023-01-21 | Disposition: A | Discharge status: Home / Self Care | Payer: 59 | Source: Ambulatory Visit | Attending: Surgery | Admitting: Surgery

## 2023-01-21 ENCOUNTER — Other Ambulatory Visit: Payer: Self-pay

## 2023-01-21 DIAGNOSIS — M1612 Unilateral primary osteoarthritis, left hip: Secondary | ICD-10-CM | POA: Diagnosis not present

## 2023-01-21 MED ORDER — OXYCODONE-ACETAMINOPHEN 5-325 MG PO TABS
1.0000 | ORAL_TABLET | Freq: Two times a day (BID) | ORAL | 0 refills | Status: AC | PRN
  Filled 2023-01-21: qty 20, 10d supply, fill #0

## 2023-01-22 ENCOUNTER — Other Ambulatory Visit: Payer: Self-pay

## 2023-01-23 ENCOUNTER — Other Ambulatory Visit: Payer: Self-pay

## 2023-01-26 ENCOUNTER — Other Ambulatory Visit: Payer: Self-pay

## 2023-01-26 DIAGNOSIS — Z13220 Encounter for screening for lipoid disorders: Secondary | ICD-10-CM | POA: Diagnosis not present

## 2023-01-26 DIAGNOSIS — Z0000 Encounter for general adult medical examination without abnormal findings: Secondary | ICD-10-CM | POA: Diagnosis not present

## 2023-01-28 ENCOUNTER — Other Ambulatory Visit: Payer: Self-pay

## 2023-02-02 ENCOUNTER — Other Ambulatory Visit: Payer: Self-pay

## 2023-02-02 DIAGNOSIS — M5010 Cervical disc disorder with radiculopathy, unspecified cervical region: Secondary | ICD-10-CM | POA: Diagnosis not present

## 2023-02-02 DIAGNOSIS — E538 Deficiency of other specified B group vitamins: Secondary | ICD-10-CM | POA: Diagnosis not present

## 2023-02-02 DIAGNOSIS — M5116 Intervertebral disc disorders with radiculopathy, lumbar region: Secondary | ICD-10-CM | POA: Diagnosis not present

## 2023-02-02 DIAGNOSIS — Z0000 Encounter for general adult medical examination without abnormal findings: Secondary | ICD-10-CM | POA: Diagnosis not present

## 2023-02-02 MED ORDER — GABAPENTIN 300 MG PO CAPS
300.0000 mg | ORAL_CAPSULE | Freq: Every evening | ORAL | 11 refills | Status: AC
  Filled 2023-02-02: qty 30, 30d supply, fill #0
  Filled 2023-02-24: qty 30, 30d supply, fill #1
  Filled 2023-03-29: qty 30, 30d supply, fill #2
  Filled 2023-04-28: qty 30, 30d supply, fill #3
  Filled 2023-06-06: qty 30, 30d supply, fill #4
  Filled 2023-07-07: qty 30, 30d supply, fill #5
  Filled 2023-08-02: qty 30, 30d supply, fill #6
  Filled 2023-09-01: qty 30, 30d supply, fill #7
  Filled 2023-10-05: qty 30, 30d supply, fill #8
  Filled 2023-10-30 – 2024-02-01 (×3): qty 30, 30d supply, fill #9

## 2023-02-02 MED ORDER — ZOLPIDEM TARTRATE 10 MG PO TABS
10.0000 mg | ORAL_TABLET | Freq: Every evening | ORAL | 5 refills | Status: DC | PRN
  Filled 2023-02-02: qty 30, 30d supply, fill #0
  Filled 2023-02-24 – 2023-03-05 (×3): qty 30, 30d supply, fill #1
  Filled 2023-03-29: qty 30, 30d supply, fill #2
  Filled 2023-04-16 – 2023-04-28 (×2): qty 15, 30d supply, fill #2
  Filled 2023-06-06: qty 15, 30d supply, fill #3
  Filled 2023-07-07: qty 15, 30d supply, fill #4

## 2023-02-02 MED ORDER — DICLOFENAC SODIUM 75 MG PO TBEC
75.0000 mg | DELAYED_RELEASE_TABLET | Freq: Two times a day (BID) | ORAL | 11 refills | Status: DC
  Filled 2023-02-02: qty 60, 30d supply, fill #0
  Filled 2023-02-24: qty 60, 30d supply, fill #1
  Filled 2023-03-29: qty 60, 30d supply, fill #2
  Filled 2023-08-02: qty 60, 30d supply, fill #3
  Filled 2023-10-05 – 2024-02-01 (×2): qty 60, 30d supply, fill #4

## 2023-02-02 MED ORDER — PREDNISONE 10 MG PO TABS
10.0000 mg | ORAL_TABLET | Freq: Every day | ORAL | 0 refills | Status: AC
  Filled 2023-02-02: qty 14, 14d supply, fill #0

## 2023-02-02 MED ORDER — ALPRAZOLAM 0.5 MG PO TABS
0.5000 mg | ORAL_TABLET | Freq: Two times a day (BID) | ORAL | 5 refills | Status: DC | PRN
  Filled 2023-02-02 – 2023-02-24 (×2): qty 60, 30d supply, fill #0
  Filled 2023-03-29: qty 60, 30d supply, fill #1
  Filled 2023-04-28: qty 60, 30d supply, fill #2
  Filled 2023-06-06: qty 60, 30d supply, fill #3
  Filled 2023-07-07: qty 60, 30d supply, fill #4

## 2023-02-02 MED ORDER — CYANOCOBALAMIN 1000 MCG/ML IJ SOLN
1000.0000 ug | INTRAMUSCULAR | 1 refills | Status: AC
  Filled 2023-02-02: qty 1, 30d supply, fill #0
  Filled 2023-02-24: qty 1, 30d supply, fill #1
  Filled 2023-03-29: qty 1, 30d supply, fill #2
  Filled 2023-04-28: qty 1, 30d supply, fill #3
  Filled 2023-06-06: qty 1, 30d supply, fill #4
  Filled 2023-07-07: qty 1, 30d supply, fill #5
  Filled 2023-08-02: qty 1, 30d supply, fill #6
  Filled 2023-09-01: qty 1, 30d supply, fill #7
  Filled 2023-10-05 (×2): qty 1, 30d supply, fill #8
  Filled 2023-10-30 – 2024-02-01 (×2): qty 1, 30d supply, fill #9

## 2023-02-02 MED ORDER — VENLAFAXINE HCL ER 37.5 MG PO CP24
37.5000 mg | ORAL_CAPSULE | Freq: Every day | ORAL | 11 refills | Status: DC
  Filled 2023-02-02: qty 30, 30d supply, fill #0
  Filled 2023-02-24: qty 30, 30d supply, fill #1

## 2023-02-02 MED ORDER — TRAMADOL HCL 50 MG PO TABS
50.0000 mg | ORAL_TABLET | Freq: Four times a day (QID) | ORAL | 5 refills | Status: DC
  Filled 2023-02-24: qty 120, 30d supply, fill #0
  Filled 2023-03-29: qty 120, 30d supply, fill #1
  Filled 2023-04-28: qty 120, 30d supply, fill #2
  Filled 2023-06-06: qty 120, 30d supply, fill #3
  Filled 2023-07-07: qty 120, 30d supply, fill #4

## 2023-02-16 DIAGNOSIS — Z96651 Presence of right artificial knee joint: Secondary | ICD-10-CM | POA: Diagnosis not present

## 2023-02-16 DIAGNOSIS — S83272D Complex tear of lateral meniscus, current injury, left knee, subsequent encounter: Secondary | ICD-10-CM | POA: Diagnosis not present

## 2023-02-16 DIAGNOSIS — M238X1 Other internal derangements of right knee: Secondary | ICD-10-CM | POA: Diagnosis not present

## 2023-02-24 ENCOUNTER — Other Ambulatory Visit: Payer: Self-pay

## 2023-02-25 ENCOUNTER — Other Ambulatory Visit: Payer: Self-pay

## 2023-02-25 DIAGNOSIS — M5116 Intervertebral disc disorders with radiculopathy, lumbar region: Secondary | ICD-10-CM | POA: Diagnosis not present

## 2023-02-25 DIAGNOSIS — M109 Gout, unspecified: Secondary | ICD-10-CM | POA: Diagnosis not present

## 2023-02-25 DIAGNOSIS — G959 Disease of spinal cord, unspecified: Secondary | ICD-10-CM | POA: Diagnosis not present

## 2023-02-25 DIAGNOSIS — M5010 Cervical disc disorder with radiculopathy, unspecified cervical region: Secondary | ICD-10-CM | POA: Diagnosis not present

## 2023-02-25 MED ORDER — VENLAFAXINE HCL ER 75 MG PO CP24
75.0000 mg | ORAL_CAPSULE | Freq: Every day | ORAL | 3 refills | Status: AC
  Filled 2023-02-25: qty 30, 30d supply, fill #0
  Filled 2023-03-29: qty 30, 30d supply, fill #1
  Filled 2023-04-28: qty 30, 30d supply, fill #2
  Filled 2023-06-06: qty 30, 30d supply, fill #3
  Filled 2023-07-07: qty 30, 30d supply, fill #4
  Filled 2023-08-02: qty 30, 30d supply, fill #5
  Filled 2023-09-01: qty 30, 30d supply, fill #6
  Filled 2023-10-05: qty 30, 30d supply, fill #7
  Filled 2023-10-30 – 2024-02-01 (×2): qty 30, 30d supply, fill #8

## 2023-02-25 MED ORDER — DICLOFENAC SODIUM 75 MG PO TBEC
75.0000 mg | DELAYED_RELEASE_TABLET | Freq: Two times a day (BID) | ORAL | 11 refills | Status: AC
  Filled 2023-02-25 – 2023-04-28 (×2): qty 60, 30d supply, fill #0
  Filled 2023-06-06: qty 60, 30d supply, fill #1
  Filled 2023-07-07: qty 60, 30d supply, fill #2
  Filled 2023-09-01: qty 60, 30d supply, fill #3
  Filled 2024-01-04 (×2): qty 60, 30d supply, fill #4

## 2023-02-26 ENCOUNTER — Other Ambulatory Visit: Payer: Self-pay

## 2023-02-28 DIAGNOSIS — S93402A Sprain of unspecified ligament of left ankle, initial encounter: Secondary | ICD-10-CM | POA: Diagnosis not present

## 2023-03-01 ENCOUNTER — Other Ambulatory Visit: Payer: Self-pay

## 2023-03-04 ENCOUNTER — Other Ambulatory Visit: Payer: Self-pay | Admitting: Internal Medicine

## 2023-03-04 DIAGNOSIS — G959 Disease of spinal cord, unspecified: Secondary | ICD-10-CM

## 2023-03-04 DIAGNOSIS — M5116 Intervertebral disc disorders with radiculopathy, lumbar region: Secondary | ICD-10-CM

## 2023-03-05 ENCOUNTER — Other Ambulatory Visit: Payer: Self-pay

## 2023-03-29 ENCOUNTER — Other Ambulatory Visit: Payer: Self-pay

## 2023-03-30 ENCOUNTER — Other Ambulatory Visit: Payer: Self-pay

## 2023-04-03 ENCOUNTER — Encounter: Payer: Self-pay | Admitting: Internal Medicine

## 2023-04-09 ENCOUNTER — Ambulatory Visit
Admission: RE | Admit: 2023-04-09 | Discharge: 2023-04-09 | Disposition: A | Discharge status: Home / Self Care | Payer: 59 | Source: Ambulatory Visit | Attending: Internal Medicine | Admitting: Internal Medicine

## 2023-04-09 DIAGNOSIS — M5416 Radiculopathy, lumbar region: Secondary | ICD-10-CM | POA: Diagnosis not present

## 2023-04-09 DIAGNOSIS — M5116 Intervertebral disc disorders with radiculopathy, lumbar region: Secondary | ICD-10-CM

## 2023-04-09 DIAGNOSIS — G959 Disease of spinal cord, unspecified: Secondary | ICD-10-CM

## 2023-04-09 DIAGNOSIS — M5136 Other intervertebral disc degeneration, lumbar region: Secondary | ICD-10-CM | POA: Diagnosis not present

## 2023-04-11 DIAGNOSIS — S8392XA Sprain of unspecified site of left knee, initial encounter: Secondary | ICD-10-CM | POA: Diagnosis not present

## 2023-04-16 ENCOUNTER — Other Ambulatory Visit: Payer: Self-pay

## 2023-04-17 DIAGNOSIS — M238X1 Other internal derangements of right knee: Secondary | ICD-10-CM | POA: Diagnosis not present

## 2023-04-17 DIAGNOSIS — Z96651 Presence of right artificial knee joint: Secondary | ICD-10-CM | POA: Diagnosis not present

## 2023-04-17 DIAGNOSIS — M25561 Pain in right knee: Secondary | ICD-10-CM | POA: Diagnosis not present

## 2023-04-17 DIAGNOSIS — M25562 Pain in left knee: Secondary | ICD-10-CM | POA: Diagnosis not present

## 2023-04-17 DIAGNOSIS — S86912A Strain of unspecified muscle(s) and tendon(s) at lower leg level, left leg, initial encounter: Secondary | ICD-10-CM | POA: Diagnosis not present

## 2023-04-17 DIAGNOSIS — M1732 Unilateral post-traumatic osteoarthritis, left knee: Secondary | ICD-10-CM | POA: Diagnosis not present

## 2023-04-27 ENCOUNTER — Other Ambulatory Visit: Payer: Self-pay

## 2023-04-28 ENCOUNTER — Other Ambulatory Visit: Payer: Self-pay

## 2023-04-29 ENCOUNTER — Other Ambulatory Visit: Payer: Self-pay

## 2023-04-29 MED ORDER — TIZANIDINE HCL 4 MG PO TABS
4.0000 mg | ORAL_TABLET | Freq: Three times a day (TID) | ORAL | 1 refills | Status: DC | PRN
  Filled 2023-04-29: qty 90, 30d supply, fill #0
  Filled 2023-06-06: qty 90, 30d supply, fill #1

## 2023-06-07 ENCOUNTER — Other Ambulatory Visit: Payer: Self-pay

## 2023-06-08 ENCOUNTER — Other Ambulatory Visit: Payer: Self-pay

## 2023-07-01 ENCOUNTER — Other Ambulatory Visit: Payer: Self-pay

## 2023-07-01 MED ORDER — TIZANIDINE HCL 4 MG PO TABS
4.0000 mg | ORAL_TABLET | Freq: Three times a day (TID) | ORAL | 1 refills | Status: DC | PRN
  Filled 2023-07-01: qty 90, 30d supply, fill #0
  Filled 2023-08-02: qty 90, 30d supply, fill #1

## 2023-07-07 ENCOUNTER — Other Ambulatory Visit: Payer: Self-pay

## 2023-07-30 ENCOUNTER — Other Ambulatory Visit: Payer: Self-pay

## 2023-07-30 MED ORDER — LEVOFLOXACIN 500 MG PO TABS
500.0000 mg | ORAL_TABLET | Freq: Every day | ORAL | 0 refills | Status: AC
  Filled 2023-07-30: qty 7, 7d supply, fill #0

## 2023-08-02 ENCOUNTER — Other Ambulatory Visit: Payer: Self-pay

## 2023-08-03 ENCOUNTER — Other Ambulatory Visit: Payer: Self-pay

## 2023-08-04 ENCOUNTER — Other Ambulatory Visit: Payer: Self-pay

## 2023-08-04 MED ORDER — ALPRAZOLAM 0.5 MG PO TABS
0.5000 mg | ORAL_TABLET | Freq: Two times a day (BID) | ORAL | 0 refills | Status: DC | PRN
  Filled 2023-08-04: qty 60, 30d supply, fill #0

## 2023-08-04 MED ORDER — TRAMADOL HCL 50 MG PO TABS
50.0000 mg | ORAL_TABLET | Freq: Four times a day (QID) | ORAL | 0 refills | Status: DC
  Filled 2023-08-04: qty 120, 30d supply, fill #0

## 2023-08-04 MED ORDER — ZOLPIDEM TARTRATE 10 MG PO TABS
10.0000 mg | ORAL_TABLET | Freq: Every evening | ORAL | 0 refills | Status: DC | PRN
  Filled 2023-08-04: qty 30, 30d supply, fill #0

## 2023-08-05 ENCOUNTER — Other Ambulatory Visit: Payer: Self-pay

## 2023-08-27 ENCOUNTER — Other Ambulatory Visit: Payer: Self-pay

## 2023-08-27 MED ORDER — OXYCODONE-ACETAMINOPHEN 5-325 MG PO TABS
1.0000 | ORAL_TABLET | Freq: Four times a day (QID) | ORAL | 0 refills | Status: AC | PRN
  Filled 2023-08-27: qty 20, 5d supply, fill #0

## 2023-08-28 ENCOUNTER — Other Ambulatory Visit: Payer: Self-pay

## 2023-09-01 ENCOUNTER — Other Ambulatory Visit: Payer: Self-pay | Admitting: Student

## 2023-09-01 ENCOUNTER — Other Ambulatory Visit: Payer: Self-pay

## 2023-09-01 DIAGNOSIS — S83232D Complex tear of medial meniscus, current injury, left knee, subsequent encounter: Secondary | ICD-10-CM

## 2023-09-01 DIAGNOSIS — M1732 Unilateral post-traumatic osteoarthritis, left knee: Secondary | ICD-10-CM

## 2023-09-01 DIAGNOSIS — M25562 Pain in left knee: Secondary | ICD-10-CM

## 2023-09-01 DIAGNOSIS — Z9889 Other specified postprocedural states: Secondary | ICD-10-CM

## 2023-09-01 DIAGNOSIS — S83272S Complex tear of lateral meniscus, current injury, left knee, sequela: Secondary | ICD-10-CM

## 2023-09-01 MED ORDER — TRAMADOL HCL 50 MG PO TABS
50.0000 mg | ORAL_TABLET | Freq: Four times a day (QID) | ORAL | 5 refills | Status: DC
  Filled 2023-09-01: qty 120, 30d supply, fill #0
  Filled 2023-10-05: qty 120, 30d supply, fill #1
  Filled 2023-10-30 – 2023-12-02 (×2): qty 120, 30d supply, fill #2
  Filled 2024-01-04 (×2): qty 120, 30d supply, fill #3
  Filled 2024-02-01: qty 120, 30d supply, fill #4

## 2023-09-01 MED ORDER — ZOLPIDEM TARTRATE 10 MG PO TABS
10.0000 mg | ORAL_TABLET | Freq: Every evening | ORAL | 5 refills | Status: DC | PRN
  Filled 2023-09-01: qty 30, 30d supply, fill #0
  Filled 2023-10-05: qty 30, 30d supply, fill #1
  Filled 2023-10-30 – 2024-02-01 (×3): qty 30, 30d supply, fill #2

## 2023-09-01 MED ORDER — TIZANIDINE HCL 4 MG PO TABS
4.0000 mg | ORAL_TABLET | Freq: Three times a day (TID) | ORAL | 1 refills | Status: AC | PRN
  Filled 2023-09-01: qty 90, 30d supply, fill #0
  Filled 2023-12-05: qty 90, 30d supply, fill #1

## 2023-09-01 MED ORDER — ALPRAZOLAM 0.5 MG PO TABS
0.5000 mg | ORAL_TABLET | Freq: Two times a day (BID) | ORAL | 5 refills | Status: DC | PRN
  Filled 2023-09-01: qty 60, 30d supply, fill #0

## 2023-09-04 ENCOUNTER — Other Ambulatory Visit: Payer: Self-pay

## 2023-09-04 MED ORDER — DIAZEPAM 5 MG PO TABS
5.0000 mg | ORAL_TABLET | Freq: Two times a day (BID) | ORAL | 0 refills | Status: AC | PRN
  Filled 2023-09-04: qty 20, 10d supply, fill #0

## 2023-09-07 ENCOUNTER — Other Ambulatory Visit: Payer: Self-pay

## 2023-09-07 MED ORDER — OXYCODONE-ACETAMINOPHEN 5-325 MG PO TABS
1.0000 | ORAL_TABLET | Freq: Four times a day (QID) | ORAL | 0 refills | Status: DC | PRN
  Filled 2023-09-07: qty 30, 8d supply, fill #0

## 2023-09-24 ENCOUNTER — Other Ambulatory Visit: Payer: Self-pay

## 2023-09-24 MED ORDER — ARIPIPRAZOLE 2 MG PO TABS
2.0000 mg | ORAL_TABLET | Freq: Every day | ORAL | 2 refills | Status: DC
  Filled 2023-09-24: qty 30, 30d supply, fill #0

## 2023-10-01 ENCOUNTER — Other Ambulatory Visit: Payer: Self-pay

## 2023-10-01 MED ORDER — TIZANIDINE HCL 4 MG PO TABS
4.0000 mg | ORAL_TABLET | Freq: Three times a day (TID) | ORAL | 1 refills | Status: AC | PRN
  Filled 2023-10-01: qty 90, 30d supply, fill #0
  Filled 2023-10-30: qty 90, 30d supply, fill #1

## 2023-10-05 ENCOUNTER — Other Ambulatory Visit: Payer: Self-pay

## 2023-10-05 MED ORDER — GABAPENTIN 300 MG PO CAPS
300.0000 mg | ORAL_CAPSULE | Freq: Every day | ORAL | 5 refills | Status: AC
Start: 2023-10-05 — End: ?
  Filled 2023-10-30 – 2023-11-02 (×2): qty 30, 30d supply, fill #0
  Filled 2023-12-02: qty 30, 30d supply, fill #1
  Filled 2024-01-04 (×2): qty 30, 30d supply, fill #2
  Filled 2024-02-01: qty 30, 30d supply, fill #3
  Filled 2024-08-21: qty 30, 30d supply, fill #4

## 2023-10-05 MED ORDER — ZOLPIDEM TARTRATE 10 MG PO TABS
10.0000 mg | ORAL_TABLET | Freq: Every evening | ORAL | 5 refills | Status: AC | PRN
  Filled 2023-10-30 – 2023-11-02 (×2): qty 30, 30d supply, fill #0
  Filled 2023-12-02: qty 30, 30d supply, fill #1
  Filled 2024-01-04 (×2): qty 30, 30d supply, fill #2

## 2023-10-05 MED ORDER — VENLAFAXINE HCL ER 75 MG PO CP24
75.0000 mg | ORAL_CAPSULE | Freq: Every day | ORAL | 1 refills | Status: DC
  Filled 2023-10-30: qty 90, 90d supply, fill #0
  Filled 2024-03-06: qty 90, 90d supply, fill #1

## 2023-10-05 MED ORDER — CYANOCOBALAMIN 1000 MCG/ML IJ SOLN
1000.0000 ug | INTRAMUSCULAR | 0 refills | Status: AC
  Filled 2023-10-30 – 2023-12-02 (×2): qty 3, 90d supply, fill #0
  Filled 2024-06-16: qty 3, 90d supply, fill #1

## 2023-10-05 MED ORDER — OXYCODONE-ACETAMINOPHEN 5-325 MG PO TABS
1.0000 | ORAL_TABLET | Freq: Two times a day (BID) | ORAL | 0 refills | Status: DC | PRN
  Filled 2023-10-05: qty 30, 15d supply, fill #0

## 2023-10-05 MED ORDER — TRAMADOL HCL 50 MG PO TABS
50.0000 mg | ORAL_TABLET | Freq: Four times a day (QID) | ORAL | 5 refills | Status: DC
  Filled 2023-10-30 – 2023-11-02 (×2): qty 120, 30d supply, fill #0

## 2023-10-05 MED ORDER — DICLOFENAC SODIUM 75 MG PO TBEC: 75.0000 mg | DELAYED_RELEASE_TABLET | Freq: Two times a day (BID) | ORAL | 5 refills | Status: DC

## 2023-10-13 ENCOUNTER — Other Ambulatory Visit: Payer: Self-pay

## 2023-10-13 MED ORDER — ARIPIPRAZOLE 2 MG PO TABS
4.0000 mg | ORAL_TABLET | Freq: Every day | ORAL | 5 refills | Status: AC
  Filled 2023-10-14: qty 60, 30d supply, fill #0
  Filled 2023-10-30: qty 60, 30d supply, fill #1
  Filled 2023-12-02: qty 60, 30d supply, fill #2
  Filled 2024-01-04 (×2): qty 60, 30d supply, fill #3
  Filled 2024-02-01: qty 60, 30d supply, fill #4
  Filled 2024-08-21: qty 60, 30d supply, fill #5

## 2023-10-14 ENCOUNTER — Other Ambulatory Visit: Payer: Self-pay

## 2023-10-30 ENCOUNTER — Other Ambulatory Visit: Payer: Self-pay

## 2023-10-30 ENCOUNTER — Other Ambulatory Visit (HOSPITAL_BASED_OUTPATIENT_CLINIC_OR_DEPARTMENT_OTHER): Payer: Self-pay

## 2023-10-30 MED ORDER — OXYCODONE-ACETAMINOPHEN 5-325 MG PO TABS
1.0000 | ORAL_TABLET | Freq: Two times a day (BID) | ORAL | 0 refills | Status: DC | PRN
  Filled 2023-10-30: qty 30, 15d supply, fill #0

## 2023-11-02 ENCOUNTER — Other Ambulatory Visit: Payer: Self-pay

## 2023-11-02 ENCOUNTER — Other Ambulatory Visit (HOSPITAL_BASED_OUTPATIENT_CLINIC_OR_DEPARTMENT_OTHER): Payer: Self-pay

## 2023-11-03 ENCOUNTER — Other Ambulatory Visit: Payer: Self-pay

## 2023-11-19 ENCOUNTER — Other Ambulatory Visit: Payer: Self-pay

## 2023-11-19 MED ORDER — SENNOSIDES-DOCUSATE SODIUM 8.6-50 MG PO TABS
2.0000 | ORAL_TABLET | Freq: Two times a day (BID) | ORAL | 0 refills | Status: AC
  Filled 2023-11-19: qty 28, 7d supply, fill #0

## 2023-11-19 MED ORDER — MELOXICAM 7.5 MG PO TABS
7.5000 mg | ORAL_TABLET | Freq: Every day | ORAL | 0 refills | Status: AC
  Filled 2023-11-19: qty 30, 30d supply, fill #0

## 2023-11-19 MED ORDER — OXYCODONE HCL 5 MG PO TABS
5.0000 mg | ORAL_TABLET | ORAL | 0 refills | Status: DC | PRN
  Filled 2023-11-19: qty 30, 5d supply, fill #0

## 2023-11-19 MED ORDER — CEFADROXIL 500 MG PO CAPS
500.0000 mg | ORAL_CAPSULE | Freq: Two times a day (BID) | ORAL | 0 refills | Status: AC
  Filled 2023-11-19: qty 14, 7d supply, fill #0

## 2023-11-19 MED ORDER — ASPIRIN 81 MG PO CHEW
81.0000 mg | CHEWABLE_TABLET | Freq: Two times a day (BID) | ORAL | 0 refills | Status: AC
  Filled 2023-11-19: qty 60, 30d supply, fill #0

## 2023-11-19 MED ORDER — ONDANSETRON 4 MG PO TBDP
4.0000 mg | ORAL_TABLET | Freq: Four times a day (QID) | ORAL | 0 refills | Status: AC | PRN
  Filled 2023-11-19: qty 20, 5d supply, fill #0

## 2023-11-19 MED ORDER — POLYETHYLENE GLYCOL 3350 17 GM/SCOOP PO POWD
17.0000 g | Freq: Every day | ORAL | 0 refills | Status: AC
  Filled 2023-11-19: qty 238, 14d supply, fill #0

## 2023-11-19 MED ORDER — PANTOPRAZOLE SODIUM 40 MG PO TBEC
40.0000 mg | DELAYED_RELEASE_TABLET | Freq: Every day | ORAL | 0 refills | Status: AC
  Filled 2023-11-19: qty 30, 30d supply, fill #0

## 2023-11-26 ENCOUNTER — Other Ambulatory Visit: Payer: Self-pay

## 2023-11-26 MED ORDER — OXYCODONE HCL 5 MG PO TABS
5.0000 mg | ORAL_TABLET | Freq: Four times a day (QID) | ORAL | 0 refills | Status: DC | PRN
  Filled 2023-11-26: qty 28, 7d supply, fill #0

## 2023-11-27 ENCOUNTER — Other Ambulatory Visit: Payer: Self-pay

## 2023-12-02 ENCOUNTER — Other Ambulatory Visit: Payer: Self-pay

## 2023-12-05 ENCOUNTER — Other Ambulatory Visit: Payer: Self-pay

## 2023-12-06 ENCOUNTER — Other Ambulatory Visit: Payer: Self-pay

## 2023-12-07 ENCOUNTER — Other Ambulatory Visit: Payer: Self-pay

## 2024-01-04 ENCOUNTER — Other Ambulatory Visit: Payer: Self-pay

## 2024-01-04 ENCOUNTER — Other Ambulatory Visit (HOSPITAL_COMMUNITY): Payer: Self-pay

## 2024-01-19 ENCOUNTER — Other Ambulatory Visit: Payer: Self-pay

## 2024-01-19 MED ORDER — AMPHETAMINE-DEXTROAMPHET ER 10 MG PO CP24
10.0000 mg | ORAL_CAPSULE | Freq: Every morning | ORAL | 0 refills | Status: DC
  Filled 2024-01-19: qty 30, 30d supply, fill #0

## 2024-01-25 ENCOUNTER — Other Ambulatory Visit: Payer: Self-pay

## 2024-01-27 ENCOUNTER — Other Ambulatory Visit: Payer: Self-pay

## 2024-01-27 MED ORDER — OXYCODONE HCL 5 MG PO TABS
5.0000 mg | ORAL_TABLET | ORAL | 0 refills | Status: AC | PRN
  Filled 2024-01-27: qty 30, 5d supply, fill #0

## 2024-01-29 ENCOUNTER — Other Ambulatory Visit: Payer: Self-pay

## 2024-01-29 MED ORDER — TIZANIDINE HCL 4 MG PO TABS
4.0000 mg | ORAL_TABLET | Freq: Three times a day (TID) | ORAL | 1 refills | Status: DC
  Filled 2024-01-29: qty 90, 30d supply, fill #0
  Filled 2024-03-06: qty 90, 30d supply, fill #1

## 2024-02-01 ENCOUNTER — Other Ambulatory Visit: Payer: Self-pay

## 2024-02-01 MED ORDER — GABAPENTIN 300 MG PO CAPS
300.0000 mg | ORAL_CAPSULE | Freq: Every day | ORAL | 5 refills | Status: AC
  Filled 2024-02-01: qty 30, 30d supply, fill #0
  Filled 2024-03-06: qty 30, 30d supply, fill #1
  Filled 2024-05-17: qty 30, 30d supply, fill #2
  Filled 2024-09-22 – 2024-10-27 (×2): qty 30, 30d supply, fill #3
  Filled 2024-12-22: qty 30, 30d supply, fill #4

## 2024-02-01 MED ORDER — ARIPIPRAZOLE 2 MG PO TABS
4.0000 mg | ORAL_TABLET | Freq: Every day | ORAL | 5 refills | Status: AC
  Filled 2024-02-01: qty 60, 30d supply, fill #0
  Filled 2024-03-06: qty 60, 30d supply, fill #1
  Filled 2024-06-16: qty 60, 30d supply, fill #2
  Filled 2024-10-27: qty 60, 30d supply, fill #3

## 2024-02-01 MED ORDER — CYANOCOBALAMIN 1000 MCG/ML IJ SOLN
1000.0000 ug | INTRAMUSCULAR | 0 refills | Status: AC
  Filled 2024-02-01: qty 1, 30d supply, fill #0
  Filled 2024-03-06: qty 1, 30d supply, fill #1
  Filled 2024-07-24: qty 1, 30d supply, fill #2
  Filled 2024-09-15: qty 1, 30d supply, fill #3

## 2024-02-02 ENCOUNTER — Other Ambulatory Visit: Payer: Self-pay

## 2024-02-02 MED ORDER — OXYCODONE HCL 5 MG PO TABS
5.0000 mg | ORAL_TABLET | Freq: Four times a day (QID) | ORAL | 0 refills | Status: DC | PRN
Start: 2024-02-04 — End: 2024-02-25
  Filled 2024-02-04: qty 28, 7d supply, fill #0

## 2024-02-04 ENCOUNTER — Other Ambulatory Visit (HOSPITAL_COMMUNITY): Payer: Self-pay

## 2024-02-04 ENCOUNTER — Other Ambulatory Visit: Payer: Self-pay

## 2024-02-15 ENCOUNTER — Other Ambulatory Visit: Payer: Self-pay

## 2024-02-15 MED ORDER — AMPHETAMINE-DEXTROAMPHET ER 10 MG PO CP24
10.0000 mg | ORAL_CAPSULE | Freq: Every morning | ORAL | 0 refills | Status: AC
  Filled 2024-02-15: qty 30, 30d supply, fill #0

## 2024-02-15 MED ORDER — LORAZEPAM 1 MG PO TABS
1.0000 mg | ORAL_TABLET | Freq: Every day | ORAL | 5 refills | Status: DC
  Filled 2024-02-15: qty 30, 30d supply, fill #0
  Filled 2024-04-13: qty 30, 30d supply, fill #1
  Filled 2024-05-17: qty 30, 30d supply, fill #2
  Filled 2024-06-16: qty 30, 30d supply, fill #3
  Filled 2024-07-24: qty 30, 30d supply, fill #4

## 2024-02-25 ENCOUNTER — Other Ambulatory Visit: Payer: Self-pay

## 2024-02-25 MED ORDER — OXYCODONE HCL 5 MG PO TABS
5.0000 mg | ORAL_TABLET | ORAL | 0 refills | Status: AC | PRN
Start: 2024-02-24 — End: ?
  Filled 2024-02-25: qty 12, 2d supply, fill #0

## 2024-02-25 MED ORDER — ONDANSETRON HCL 4 MG PO TABS
4.0000 mg | ORAL_TABLET | Freq: Three times a day (TID) | ORAL | 0 refills | Status: AC | PRN
  Filled 2024-02-25: qty 20, 7d supply, fill #0

## 2024-02-25 MED ORDER — SULFAMETHOXAZOLE-TRIMETHOPRIM 800-160 MG PO TABS
1.0000 | ORAL_TABLET | Freq: Two times a day (BID) | ORAL | 0 refills | Status: AC
  Filled 2024-02-25: qty 14, 7d supply, fill #0

## 2024-03-06 ENCOUNTER — Other Ambulatory Visit: Payer: Self-pay

## 2024-03-07 ENCOUNTER — Other Ambulatory Visit: Payer: Self-pay

## 2024-03-07 MED ORDER — GABAPENTIN 300 MG PO CAPS
300.0000 mg | ORAL_CAPSULE | Freq: Every day | ORAL | 11 refills | Status: AC
  Filled 2024-03-07: qty 30, 30d supply, fill #0
  Filled 2024-06-16: qty 30, 30d supply, fill #1
  Filled 2024-07-24: qty 30, 30d supply, fill #2
  Filled 2024-08-21: qty 30, 30d supply, fill #3
  Filled 2024-09-15 – 2024-09-23 (×3): qty 30, 30d supply, fill #4
  Filled 2024-10-27 – 2024-11-19 (×2): qty 30, 30d supply, fill #5

## 2024-03-09 ENCOUNTER — Other Ambulatory Visit: Payer: Self-pay

## 2024-03-10 ENCOUNTER — Other Ambulatory Visit: Payer: Self-pay

## 2024-03-14 ENCOUNTER — Other Ambulatory Visit: Payer: Self-pay

## 2024-03-14 MED ORDER — TRAMADOL HCL 50 MG PO TABS
50.0000 mg | ORAL_TABLET | Freq: Four times a day (QID) | ORAL | 5 refills | Status: AC
  Filled 2024-03-14: qty 120, 30d supply, fill #0
  Filled 2024-04-13: qty 120, 30d supply, fill #1
  Filled 2024-05-17: qty 120, 30d supply, fill #2
  Filled 2024-06-16: qty 120, 30d supply, fill #3
  Filled 2024-07-24: qty 120, 30d supply, fill #4

## 2024-04-13 ENCOUNTER — Other Ambulatory Visit: Payer: Self-pay

## 2024-04-13 MED ORDER — TIZANIDINE HCL 4 MG PO TABS
4.0000 mg | ORAL_TABLET | Freq: Three times a day (TID) | ORAL | 1 refills | Status: DC | PRN
  Filled 2024-04-13 – 2024-05-17 (×2): qty 90, 30d supply, fill #0
  Filled 2024-06-16: qty 90, 30d supply, fill #1

## 2024-04-14 ENCOUNTER — Other Ambulatory Visit: Payer: Self-pay

## 2024-04-14 MED ORDER — ZOLPIDEM TARTRATE 10 MG PO TABS
10.0000 mg | ORAL_TABLET | Freq: Every evening | ORAL | 5 refills | Status: AC | PRN
  Filled 2024-04-14: qty 30, 30d supply, fill #0

## 2024-04-14 MED ORDER — DICLOFENAC SODIUM 75 MG PO TBEC
75.0000 mg | DELAYED_RELEASE_TABLET | Freq: Two times a day (BID) | ORAL | 11 refills | Status: AC
  Filled 2024-04-14 – 2024-05-19 (×2): qty 60, 30d supply, fill #0

## 2024-04-25 ENCOUNTER — Other Ambulatory Visit: Payer: Self-pay

## 2024-05-17 ENCOUNTER — Other Ambulatory Visit: Payer: Self-pay

## 2024-05-18 ENCOUNTER — Other Ambulatory Visit: Payer: Self-pay

## 2024-05-19 ENCOUNTER — Other Ambulatory Visit: Payer: Self-pay

## 2024-05-19 MED ORDER — DICLOFENAC SODIUM 75 MG PO TBEC
75.0000 mg | DELAYED_RELEASE_TABLET | Freq: Two times a day (BID) | ORAL | 11 refills | Status: AC
  Filled 2024-05-19 – 2024-06-16 (×2): qty 60, 30d supply, fill #0
  Filled 2024-08-21: qty 60, 30d supply, fill #1
  Filled 2024-09-15: qty 60, 30d supply, fill #2
  Filled 2024-11-14: qty 60, 30d supply, fill #3

## 2024-05-20 ENCOUNTER — Other Ambulatory Visit: Payer: Self-pay

## 2024-05-23 ENCOUNTER — Other Ambulatory Visit: Payer: Self-pay

## 2024-06-16 ENCOUNTER — Other Ambulatory Visit: Payer: Self-pay

## 2024-06-17 ENCOUNTER — Other Ambulatory Visit: Payer: Self-pay

## 2024-06-17 MED ORDER — VENLAFAXINE HCL ER 75 MG PO CP24
75.0000 mg | ORAL_CAPSULE | Freq: Every day | ORAL | 1 refills | Status: AC
  Filled 2024-06-17: qty 90, 90d supply, fill #0
  Filled 2024-08-21 – 2024-09-15 (×2): qty 90, 90d supply, fill #1

## 2024-06-24 ENCOUNTER — Other Ambulatory Visit: Payer: Self-pay

## 2024-07-24 ENCOUNTER — Other Ambulatory Visit: Payer: Self-pay

## 2024-07-25 ENCOUNTER — Other Ambulatory Visit: Payer: Self-pay

## 2024-07-26 ENCOUNTER — Other Ambulatory Visit: Payer: Self-pay

## 2024-07-26 MED ORDER — TIZANIDINE HCL 4 MG PO TABS
4.0000 mg | ORAL_TABLET | Freq: Three times a day (TID) | ORAL | 1 refills | Status: DC | PRN
  Filled 2024-07-26: qty 90, 30d supply, fill #0
  Filled 2024-08-21: qty 90, 30d supply, fill #1

## 2024-08-17 ENCOUNTER — Other Ambulatory Visit: Payer: Self-pay

## 2024-08-17 MED ORDER — TRAMADOL HCL 50 MG PO TABS
50.0000 mg | ORAL_TABLET | Freq: Four times a day (QID) | ORAL | 5 refills | Status: AC
  Filled 2024-08-17 – 2024-08-23 (×3): qty 120, 30d supply, fill #0
  Filled 2024-09-15 – 2024-09-23 (×3): qty 120, 30d supply, fill #1
  Filled 2024-10-27: qty 120, 30d supply, fill #2
  Filled 2024-11-14 – 2024-11-25 (×4): qty 120, 30d supply, fill #3
  Filled 2024-12-22 – 2024-12-23 (×3): qty 120, 30d supply, fill #4

## 2024-08-17 MED ORDER — ZOLPIDEM TARTRATE 10 MG PO TABS
10.0000 mg | ORAL_TABLET | Freq: Every evening | ORAL | 5 refills | Status: AC | PRN
  Filled 2024-08-17: qty 30, 30d supply, fill #0
  Filled 2024-09-15: qty 30, 30d supply, fill #1
  Filled 2024-10-27: qty 30, 30d supply, fill #2
  Filled 2024-11-14 – 2024-11-25 (×4): qty 30, 30d supply, fill #3
  Filled 2024-12-22 – 2024-12-23 (×3): qty 30, 30d supply, fill #4

## 2024-08-17 MED ORDER — ARIPIPRAZOLE 2 MG PO TABS
4.0000 mg | ORAL_TABLET | Freq: Every day | ORAL | 5 refills | Status: AC
  Filled 2024-08-17 – 2024-08-21 (×2): qty 60, 30d supply, fill #0
  Filled 2024-09-15: qty 60, 30d supply, fill #1

## 2024-08-18 ENCOUNTER — Other Ambulatory Visit: Payer: Self-pay

## 2024-08-21 ENCOUNTER — Other Ambulatory Visit: Payer: Self-pay

## 2024-08-22 ENCOUNTER — Other Ambulatory Visit: Payer: Self-pay

## 2024-08-22 MED ORDER — AMPHETAMINE-DEXTROAMPHET ER 10 MG PO CP24
10.0000 mg | ORAL_CAPSULE | Freq: Every morning | ORAL | 0 refills | Status: AC
  Filled 2024-08-22: qty 30, 30d supply, fill #0

## 2024-08-22 MED ORDER — LORAZEPAM 1 MG PO TABS
1.0000 mg | ORAL_TABLET | Freq: Every day | ORAL | 5 refills | Status: AC
  Filled 2024-08-22: qty 30, 30d supply, fill #0
  Filled 2024-09-15 – 2024-10-27 (×2): qty 30, 30d supply, fill #1
  Filled 2024-11-14 – 2024-11-25 (×4): qty 30, 30d supply, fill #2
  Filled 2024-12-22 – 2024-12-23 (×3): qty 30, 30d supply, fill #3

## 2024-08-23 ENCOUNTER — Other Ambulatory Visit: Payer: Self-pay

## 2024-08-31 ENCOUNTER — Other Ambulatory Visit: Payer: Self-pay

## 2024-08-31 MED ORDER — COLESTIPOL HCL 1 G PO TABS
2.0000 g | ORAL_TABLET | Freq: Two times a day (BID) | ORAL | 11 refills | Status: DC
  Filled 2024-08-31: qty 60, 15d supply, fill #0

## 2024-09-13 ENCOUNTER — Other Ambulatory Visit: Payer: Self-pay

## 2024-09-15 ENCOUNTER — Other Ambulatory Visit: Payer: Self-pay

## 2024-09-15 MED ORDER — TIZANIDINE HCL 4 MG PO TABS
4.0000 mg | ORAL_TABLET | Freq: Three times a day (TID) | ORAL | 1 refills | Status: DC | PRN
  Filled 2024-09-15: qty 90, 30d supply, fill #0
  Filled 2024-10-27: qty 90, 30d supply, fill #1

## 2024-09-19 ENCOUNTER — Other Ambulatory Visit: Payer: Self-pay

## 2024-09-22 ENCOUNTER — Other Ambulatory Visit: Payer: Self-pay

## 2024-09-23 ENCOUNTER — Other Ambulatory Visit: Payer: Self-pay

## 2024-09-23 MED ORDER — DOXYCYCLINE HYCLATE 100 MG PO CAPS
ORAL_CAPSULE | ORAL | 0 refills | Status: AC
  Filled 2024-09-23: qty 14, 7d supply, fill #0

## 2024-10-27 ENCOUNTER — Other Ambulatory Visit: Payer: Self-pay

## 2024-10-28 ENCOUNTER — Other Ambulatory Visit: Payer: Self-pay

## 2024-11-14 ENCOUNTER — Other Ambulatory Visit: Payer: Self-pay

## 2024-11-15 ENCOUNTER — Other Ambulatory Visit: Payer: Self-pay

## 2024-11-15 MED ORDER — TIZANIDINE HCL 4 MG PO TABS
4.0000 mg | ORAL_TABLET | Freq: Three times a day (TID) | ORAL | 1 refills | Status: AC | PRN
  Filled 2024-11-15 – 2024-11-21 (×3): qty 90, 30d supply, fill #0
  Filled 2024-12-22: qty 90, 30d supply, fill #1

## 2024-11-20 ENCOUNTER — Other Ambulatory Visit: Payer: Self-pay

## 2024-11-21 ENCOUNTER — Other Ambulatory Visit: Payer: Self-pay

## 2024-11-22 ENCOUNTER — Other Ambulatory Visit: Payer: Self-pay

## 2024-11-22 MED ORDER — PREDNISONE 10 MG PO TABS
ORAL_TABLET | ORAL | 0 refills | Status: DC
  Filled 2024-11-22: qty 30, 12d supply, fill #0

## 2024-11-25 ENCOUNTER — Other Ambulatory Visit: Payer: Self-pay

## 2024-11-26 ENCOUNTER — Other Ambulatory Visit: Payer: Self-pay

## 2024-12-01 ENCOUNTER — Other Ambulatory Visit: Payer: Self-pay

## 2024-12-01 MED ORDER — GABAPENTIN 300 MG PO CAPS
ORAL_CAPSULE | ORAL | 11 refills | Status: AC
  Filled 2024-12-01: qty 90, 30d supply, fill #0
  Filled 2024-12-22: qty 90, 30d supply, fill #1

## 2024-12-05 ENCOUNTER — Other Ambulatory Visit: Payer: Self-pay

## 2024-12-05 MED ORDER — HYDROCODONE-ACETAMINOPHEN 5-325 MG PO TABS
1.0000 | ORAL_TABLET | Freq: Two times a day (BID) | ORAL | 0 refills | Status: AC | PRN
  Filled 2024-12-05: qty 40, 20d supply, fill #0

## 2024-12-15 ENCOUNTER — Other Ambulatory Visit: Payer: Self-pay

## 2024-12-15 MED ORDER — METHYLPREDNISOLONE 4 MG PO TBPK
ORAL_TABLET | ORAL | 0 refills | Status: AC
  Filled 2024-12-15: qty 21, 6d supply, fill #0

## 2024-12-15 MED ORDER — GABAPENTIN 100 MG PO CAPS
200.0000 mg | ORAL_CAPSULE | Freq: Three times a day (TID) | ORAL | 2 refills | Status: AC
  Filled 2024-12-15: qty 180, 30d supply, fill #0

## 2024-12-22 ENCOUNTER — Other Ambulatory Visit: Payer: Self-pay

## 2024-12-22 MED ORDER — HYDROCODONE-ACETAMINOPHEN 5-325 MG PO TABS
1.0000 | ORAL_TABLET | Freq: Two times a day (BID) | ORAL | 0 refills | Status: AC
  Filled 2024-12-23: qty 60, 30d supply, fill #0

## 2024-12-22 MED ORDER — TIZANIDINE HCL 4 MG PO TABS
4.0000 mg | ORAL_TABLET | Freq: Three times a day (TID) | ORAL | 1 refills | Status: AC
  Filled 2024-12-22: qty 90, 30d supply, fill #0

## 2024-12-22 MED ORDER — ZOLPIDEM TARTRATE 10 MG PO TABS: 10.0000 mg | ORAL_TABLET | Freq: Every evening | ORAL | 5 refills | Status: AC | PRN

## 2024-12-22 MED ORDER — TRAMADOL HCL 50 MG PO TABS: 50.0000 mg | ORAL_TABLET | Freq: Four times a day (QID) | ORAL | 5 refills | Status: AC

## 2024-12-22 MED ORDER — LORAZEPAM 1 MG PO TABS: 1.0000 mg | ORAL_TABLET | Freq: Every day | ORAL | 5 refills | Status: AC

## 2024-12-23 ENCOUNTER — Other Ambulatory Visit: Payer: Self-pay

## 2024-12-27 ENCOUNTER — Other Ambulatory Visit: Payer: Self-pay
